# Patient Record
Sex: Female | Born: 1935
Health system: Southern US, Community
[De-identification: ages and names within clinical notes are randomized; demographics above are authoritative.]

## PROBLEM LIST (undated history)

## (undated) DIAGNOSIS — I10 Essential (primary) hypertension: Secondary | ICD-10-CM

## (undated) DIAGNOSIS — C801 Malignant (primary) neoplasm, unspecified: Secondary | ICD-10-CM

## (undated) DIAGNOSIS — E785 Hyperlipidemia, unspecified: Secondary | ICD-10-CM

## (undated) HISTORY — PX: ABDOMINAL HYSTERECTOMY: SHX81

## (undated) HISTORY — PX: APPENDECTOMY: SHX54

## (undated) HISTORY — PX: GALLBLADDER SURGERY: SHX652

## (undated) HISTORY — PX: OTHER SURGICAL HISTORY: SHX169

## (undated) HISTORY — DX: Hyperlipidemia, unspecified: E78.5

## (undated) HISTORY — PX: TOE SURGERY: SHX1073

## (undated) HISTORY — PX: BASAL CELL CARCINOMA EXCISION: SHX1214

## (undated) HISTORY — DX: Essential (primary) hypertension: I10

---

## 1978-08-09 HISTORY — PX: BREAST EXCISIONAL BIOPSY: SUR124

## 2004-04-12 ENCOUNTER — Ambulatory Visit: Payer: Self-pay | Admitting: Family Medicine

## 2005-06-06 ENCOUNTER — Ambulatory Visit: Payer: Self-pay | Admitting: Family Medicine

## 2006-06-12 ENCOUNTER — Ambulatory Visit: Payer: Self-pay | Admitting: Family Medicine

## 2007-06-17 ENCOUNTER — Ambulatory Visit: Payer: Self-pay | Admitting: Family Medicine

## 2008-06-17 ENCOUNTER — Ambulatory Visit: Payer: Self-pay | Admitting: Family Medicine

## 2008-06-29 ENCOUNTER — Ambulatory Visit: Payer: Self-pay | Admitting: Family Medicine

## 2009-06-22 ENCOUNTER — Ambulatory Visit: Payer: Self-pay | Admitting: Family Medicine

## 2010-06-27 ENCOUNTER — Ambulatory Visit: Payer: Self-pay | Admitting: Family Medicine

## 2010-06-30 ENCOUNTER — Ambulatory Visit: Payer: Self-pay | Admitting: Family Medicine

## 2011-07-25 ENCOUNTER — Ambulatory Visit: Payer: Self-pay | Admitting: Family Medicine

## 2012-03-13 DIAGNOSIS — E7800 Pure hypercholesterolemia, unspecified: Secondary | ICD-10-CM | POA: Insufficient documentation

## 2012-03-13 DIAGNOSIS — I10 Essential (primary) hypertension: Secondary | ICD-10-CM | POA: Insufficient documentation

## 2012-03-15 DIAGNOSIS — C4491 Basal cell carcinoma of skin, unspecified: Secondary | ICD-10-CM | POA: Insufficient documentation

## 2012-08-19 ENCOUNTER — Ambulatory Visit: Payer: Self-pay | Admitting: Family Medicine

## 2013-08-26 ENCOUNTER — Ambulatory Visit: Payer: Self-pay | Admitting: Family Medicine

## 2013-11-10 DIAGNOSIS — N289 Disorder of kidney and ureter, unspecified: Secondary | ICD-10-CM | POA: Insufficient documentation

## 2014-02-27 ENCOUNTER — Encounter: Payer: Self-pay | Admitting: Podiatry

## 2014-02-27 ENCOUNTER — Ambulatory Visit (INDEPENDENT_AMBULATORY_CARE_PROVIDER_SITE_OTHER): Payer: Medicare Other

## 2014-02-27 ENCOUNTER — Ambulatory Visit (INDEPENDENT_AMBULATORY_CARE_PROVIDER_SITE_OTHER): Payer: Medicare Other | Admitting: Podiatry

## 2014-02-27 VITALS — BP 121/61 | HR 63 | Resp 16 | Ht 67.0 in | Wt 155.0 lb

## 2014-02-27 DIAGNOSIS — M779 Enthesopathy, unspecified: Secondary | ICD-10-CM

## 2014-02-27 NOTE — Progress Notes (Signed)
   Subjective:    Patient ID: Faith Cox, female    DOB: [birth year], 80 y.o.   MRN: [id number]  HPI Comments: Temple Pacini of feet have been burning for about a month now. Also i dropped a shelf on the right great toe. Would like that looked at   Foot Pain      Review of Systems  All other systems reviewed and are negative.      Objective:   Physical Exam        Assessment & Plan:

## 2014-02-28 NOTE — Progress Notes (Signed)
Subjective:     Patient ID: Faith Cox, female   DOB: 26-Sep-1929, 80 y.o.   MRN: [id number]  HPI patient states she's had moderate burning in both her feet for the last month and that she traumatized her big toe nail right by dropping a shelf on it and it has turned black in the nail   Review of Systems  All other systems reviewed and are negative.      Objective:   Physical Exam  Constitutional: She is oriented to person, place, and time.  Cardiovascular: Intact distal pulses.   Musculoskeletal: Normal range of motion.  Neurological: She is oriented to person, place, and time.  Skin: Skin is warm.  Nursing note and vitals reviewed.  neurovascular status intact with muscle strength adequate and range of motion subtalar midtarsal joint diminished but intact. Patient's noted to have mild discomfort in the forefoot of both feet but no one specific area and is noted to have a discolored damage right hallux nail secondary to trauma     Assessment:     Possible low grade neuropathy or inflammatory capsulitis and structural of the right hallux nail has been compromised with damage    Plan:     H&P and conditions discussed and at this point I recommended soaks of the right hallux and she may lose the nail and we will start her on Advil 23 times a day and if symptoms persist she will reappoint

## 2014-05-20 DIAGNOSIS — E539 Vitamin B deficiency, unspecified: Secondary | ICD-10-CM | POA: Insufficient documentation

## 2014-09-01 ENCOUNTER — Other Ambulatory Visit: Payer: Self-pay | Admitting: Family Medicine

## 2014-09-01 DIAGNOSIS — Z1231 Encounter for screening mammogram for malignant neoplasm of breast: Secondary | ICD-10-CM

## 2014-09-16 ENCOUNTER — Ambulatory Visit
Admission: RE | Admit: 2014-09-16 | Discharge: 2014-09-16 | Disposition: A | Discharge status: Home / Self Care | Payer: Medicare Other | Source: Ambulatory Visit | Attending: Family Medicine | Admitting: Family Medicine

## 2014-09-16 ENCOUNTER — Other Ambulatory Visit: Payer: Self-pay | Admitting: Family Medicine

## 2014-09-16 DIAGNOSIS — Z1231 Encounter for screening mammogram for malignant neoplasm of breast: Secondary | ICD-10-CM

## 2015-05-10 ENCOUNTER — Encounter: Payer: Self-pay | Admitting: Family Medicine

## 2015-05-10 ENCOUNTER — Ambulatory Visit (INDEPENDENT_AMBULATORY_CARE_PROVIDER_SITE_OTHER): Payer: Medicare Other | Admitting: Family Medicine

## 2015-05-10 VITALS — BP 138/58 | HR 72 | Temp 98.1°F | Ht 63.6 in | Wt 162.0 lb

## 2015-05-10 DIAGNOSIS — E785 Hyperlipidemia, unspecified: Secondary | ICD-10-CM | POA: Diagnosis not present

## 2015-05-10 DIAGNOSIS — I129 Hypertensive chronic kidney disease with stage 1 through stage 4 chronic kidney disease, or unspecified chronic kidney disease: Secondary | ICD-10-CM

## 2015-05-10 MED ORDER — LOVASTATIN 20 MG PO TABS: 40.0000 mg | ORAL_TABLET | Freq: Every day | ORAL | Status: DC

## 2015-05-10 NOTE — Progress Notes (Signed)
BP 138/58 mmHg  Pulse 72  Temp(Src) 98.1 F (36.7 C)  Ht 5' 3.6" (1.615 m)  Wt 162 lb (73.483 kg)  BMI 28.17 kg/m2  SpO2 96%   Subjective:    Patient ID: Faith Cox, female    DOB: [birth year], 82 y.o.   MRN: [id number]  HPI: Faith Cox is a 82 y.o. female  Chief Complaint  Patient presents with  . Establish Care    Patient is here to establish care, she does not have any concerns today.   HYPERTENSION / HYPERLIPIDEMIA Satisfied with current treatment? yes, has had problems for 10 years Duration of hypertension: About 10 years BP monitoring frequency: weekly BP range: 120s-130s BP medication side effects: no Past BP meds: none Duration of hyperlipidemia: 10 years or so Cholesterol medication side effects: no Cholesterol supplements: fish oil Past cholesterol medications: none Medication compliance: excellent compliance Aspirin: yes Recent stressors: no Recurrent headaches: no Visual changes: no Palpitations: no Dyspnea: no Chest pain: no Lower extremity edema: no Dizzy/lightheaded: no  Active Ambulatory Problems    Diagnosis Date Noted  . CA skin, basal cell 03/15/2012  . Impaired renal function 11/10/2013  . Hyperlipidemia   . Benign hypertensive renal disease    Resolved Ambulatory Problems    Diagnosis Date Noted  . BP (high blood pressure) 03/13/2012  . Hypercholesteremia 03/13/2012   Past Medical History  Diagnosis Date  . Hypertension     No Known Allergies Past Surgical History  Procedure Laterality Date  . Abdominal hysterectomy    . Gallbladder surgery    . Bladder repair    . Toe surgery    . Breast excisional biopsy Left 08/09/1978   Family History  Problem Relation Age of Onset  . Pancreatic cancer Mother   . Stroke Father   . Alzheimer's disease Paternal Grandmother   . Leukemia Brother    Social History   Social History  . Marital Status: Married    Spouse Name: N/A  . Number of Children: N/A  . Years of Education: N/A    Social History Main Topics  . Smoking status: Former Smoker    Quit date: 04/11/1979  . Smokeless tobacco: Never Used     Comment: quit 30 years ago   . Alcohol Use: 0.0 oz/week    0 Standard drinks or equivalent per week     Comment: beer on occasion  . Drug Use: No  . Sexual Activity: Not Currently   Other Topics Concern  . None   Social History Narrative    Review of Systems  Constitutional: Negative.   Respiratory: Negative.   Cardiovascular: Negative.   Musculoskeletal: Negative.   Psychiatric/Behavioral: Negative.     Per HPI unless specifically indicated above     Objective:    BP 138/58 mmHg  Pulse 72  Temp(Src) 98.1 F (36.7 C)  Ht 5' 3.6" (1.615 m)  Wt 162 lb (73.483 kg)  BMI 28.17 kg/m2  SpO2 96%  Wt Readings from Last 3 Encounters:  05/10/15 162 lb (73.483 kg)  02/27/14 155 lb (70.308 kg)    Physical Exam  Constitutional: She is oriented to person, place, and time. She appears well-developed and well-nourished. No distress.  HENT:  Head: Normocephalic and atraumatic.  Right Ear: Hearing normal.  Left Ear: Hearing normal.  Nose: Nose normal.  Eyes: Conjunctivae and lids are normal. Right eye exhibits no discharge. Left eye exhibits no discharge. No scleral icterus.  Cardiovascular: Normal rate, regular rhythm, normal heart  sounds and intact distal pulses.  Exam reveals no gallop.   No murmur heard. Pulmonary/Chest: Effort normal and breath sounds normal. No respiratory distress. She has no wheezes. She has no rales. She exhibits no tenderness.  Musculoskeletal: Normal range of motion.  Neurological: She is alert and oriented to person, place, and time.  Skin: Skin is warm, dry and intact. No rash noted. No erythema. No pallor.  Psychiatric: She has a normal mood and affect. Her speech is normal and behavior is normal. Judgment and thought content normal. Cognition and memory are normal.  Nursing note and vitals reviewed.   No results found  for this or any previous visit.    Assessment & Plan:   Problem List Items Addressed This Visit      Genitourinary   Benign hypertensive renal disease - Primary    Under good control at this time. Continue current regimen. Continue to monitor. Recheck at [facility] exam in 1+ months        Other   Hyperlipidemia    Has been under good control under current regimen. Continue current regimen. Continue to monitor.       Relevant Medications   aspirin 81 MG tablet   lovastatin (MEVACOR) 20 MG tablet       Follow up plan: Return Feb, for Wellness, records release please.

## 2015-05-10 NOTE — Assessment & Plan Note (Signed)
Under good control at this time. Continue current regimen. Continue to monitor. Recheck at [facility] exam in 1+ months

## 2015-05-10 NOTE — Assessment & Plan Note (Signed)
Has been under good control under current regimen. Continue current regimen. Continue to monitor.

## 2015-05-24 DIAGNOSIS — D485 Neoplasm of uncertain behavior of skin: Secondary | ICD-10-CM | POA: Diagnosis not present

## 2015-05-24 DIAGNOSIS — Z85828 Personal history of other malignant neoplasm of skin: Secondary | ICD-10-CM | POA: Diagnosis not present

## 2015-05-24 DIAGNOSIS — C44519 Basal cell carcinoma of skin of other part of trunk: Secondary | ICD-10-CM | POA: Diagnosis not present

## 2015-05-24 DIAGNOSIS — Z08 Encounter for follow-up examination after completed treatment for malignant neoplasm: Secondary | ICD-10-CM | POA: Diagnosis not present

## 2015-05-24 DIAGNOSIS — X32XXXA Exposure to sunlight, initial encounter: Secondary | ICD-10-CM | POA: Diagnosis not present

## 2015-05-24 DIAGNOSIS — D0439 Carcinoma in situ of skin of other parts of face: Secondary | ICD-10-CM | POA: Diagnosis not present

## 2015-05-24 DIAGNOSIS — L570 Actinic keratosis: Secondary | ICD-10-CM | POA: Diagnosis not present

## 2015-05-24 DIAGNOSIS — C44719 Basal cell carcinoma of skin of left lower limb, including hip: Secondary | ICD-10-CM | POA: Diagnosis not present

## 2015-05-24 DIAGNOSIS — C44329 Squamous cell carcinoma of skin of other parts of face: Secondary | ICD-10-CM | POA: Diagnosis not present

## 2015-06-01 ENCOUNTER — Ambulatory Visit (INDEPENDENT_AMBULATORY_CARE_PROVIDER_SITE_OTHER): Payer: Medicare Other | Admitting: Family Medicine

## 2015-06-01 ENCOUNTER — Encounter: Payer: Self-pay | Admitting: Family Medicine

## 2015-06-01 VITALS — BP 149/74 | HR 66 | Temp 97.8°F | Ht 64.0 in | Wt 160.0 lb

## 2015-06-01 DIAGNOSIS — E785 Hyperlipidemia, unspecified: Secondary | ICD-10-CM | POA: Diagnosis not present

## 2015-06-01 DIAGNOSIS — I129 Hypertensive chronic kidney disease with stage 1 through stage 4 chronic kidney disease, or unspecified chronic kidney disease: Secondary | ICD-10-CM | POA: Diagnosis not present

## 2015-06-01 DIAGNOSIS — N289 Disorder of kidney and ureter, unspecified: Secondary | ICD-10-CM | POA: Diagnosis not present

## 2015-06-01 DIAGNOSIS — Z0000 Encounter for general adult medical examination without abnormal findings: Secondary | ICD-10-CM

## 2015-06-01 DIAGNOSIS — C4491 Basal cell carcinoma of skin, unspecified: Secondary | ICD-10-CM | POA: Diagnosis not present

## 2015-06-01 LAB — MICROALBUMIN, URINE WAIVED
Creatinine, Urine Waived: 100 mg/dL (ref 10–300)
Microalb, Ur Waived: 30 mg/L — ABNORMAL HIGH (ref 0–19)
Microalb/Creat Ratio: <30 mg/g (ref <30)

## 2015-06-01 LAB — LIPID PANEL PICCOLO, WAIVED
Chol/HDL Ratio Piccolo,Waive: 3.5 mg/dL
Cholesterol Piccolo, Waived: 168 mg/dL (ref <200)
HDL CHOL PICCOLO, WAIVED: 48 mg/dL — AB (ref >59)
LDL CHOL CALC PICCOLO WAIVED: 78 mg/dL (ref <100)
TRIGLYCERIDES PICCOLO,WAIVED: 208 mg/dL — AB (ref <150)
VLDL Chol Calc Piccolo,Waive: 42 mg/dL — ABNORMAL HIGH (ref <30)

## 2015-06-01 MED ORDER — LISINOPRIL-HYDROCHLOROTHIAZIDE 20-25 MG PO TABS: 1.0000 | ORAL_TABLET | Freq: Every day | ORAL | Status: DC

## 2015-06-01 NOTE — Assessment & Plan Note (Signed)
Checking CMP today. Await results. Continue ACE. Continue to monitor.

## 2015-06-01 NOTE — Progress Notes (Signed)
BP 149/74 mmHg  Pulse 66  Temp(Src) 97.8 F (36.6 C)  Ht 5\' 4"  (1.626 m)  Wt 160 lb (72.576 kg)  BMI 27.45 kg/m2  SpO2 98%   Subjective:    Patient ID: Faith Cox, female    DOB: 08/07/1929, 82 y.o.   MRN: [id number]  HPI: Faith Cox is a 82 y.o. female presenting on 06/01/2015 for comprehensive medical examination. Current medical complaints include: none  She currently lives with: husband Menopausal Symptoms: no  Depression Screen done today and results listed below:  Depression screen [facility] 2/9 06/01/2015  Decreased Interest 0  Down, Depressed, Hopeless 0  PHQ - 2 Score 0    The patient does not have a history of falls. I did not complete a risk assessment for falls. A plan of care for falls was not documented.   Past Medical History:  Past Medical History  Diagnosis Date  . Hyperlipidemia   . Hypertension     Surgical History:  Past Surgical History  Procedure Laterality Date  . Abdominal hysterectomy    . Gallbladder surgery    . Bladder repair    . Toe surgery    . Breast excisional biopsy Left 08/09/1978    Medications:  Current Outpatient Prescriptions on File Prior to Visit  Medication Sig  . aspirin 81 MG tablet Take 81 mg by mouth daily.  . Calcium Carb-Cholecalciferol [phone] MG-UNIT CAPS Take by mouth.  . lovastatin (MEVACOR) 20 MG tablet Take 2 tablets (40 mg total) by mouth at bedtime.  . Omega-3 Fatty Acids (FISH OIL) 1000 MG CPDR Take by mouth.  . vitamin B-12 (CYANOCOBALAMIN) 1000 MCG tablet Take 1,000 mcg by mouth daily.   No current facility-administered medications on file prior to visit.    Allergies:  No Known Allergies  Social History:  Social History   Social History  . Marital Status: Married    Spouse Name: N/A  . Number of Children: N/A  . Years of Education: N/A   Occupational History  . Not on file.   Social History Main Topics  . Smoking status: Former Smoker    Quit date: 04/11/1979  . Smokeless tobacco: Never  Used     Comment: quit 30 years ago   . Alcohol Use: 0.0 oz/week    0 Standard drinks or equivalent per week     Comment: beer on occasion  . Drug Use: No  . Sexual Activity: Not Currently   Other Topics Concern  . Not on file   Social History Narrative   History  Smoking status  . Former Smoker  . Quit date: 04/11/1979  Smokeless tobacco  . Never Used    Comment: quit 30 years ago    History  Alcohol Use  . 0.0 oz/week  . 0 Standard drinks or equivalent per week    Comment: beer on occasion    Family History:  Family History  Problem Relation Age of Onset  . Pancreatic cancer Mother   . Stroke Father   . Alzheimer's disease Paternal Grandmother   . Leukemia Brother     Past medical history, surgical history, medications, allergies, family history and social history reviewed with patient today and changes made to appropriate areas of the chart.   Review of Systems  Constitutional: Negative.   HENT: Negative.   Eyes: Negative.   Respiratory: Negative.   Cardiovascular: Negative.   Gastrointestinal: Negative.   Genitourinary: Negative.   Musculoskeletal: Negative.   Skin: Negative.  Neurological: Negative.   Endo/Heme/Allergies: Negative for environmental allergies and polydipsia. Bruises/bleeds easily.  Psychiatric/Behavioral: Negative.     All other ROS negative except what is listed above and in the HPI.      Objective:    BP 149/74 mmHg  Pulse 66  Temp(Src) 97.8 F (36.6 C)  Ht 5\' 4"  (1.626 m)  Wt 160 lb (72.576 kg)  BMI 27.45 kg/m2  SpO2 98%  Wt Readings from Last 3 Encounters:  06/01/15 160 lb (72.576 kg)  05/10/15 162 lb (73.483 kg)  02/27/14 155 lb (70.308 kg)     Hearing Screening   125Hz  250Hz  500Hz  1000Hz  2000Hz  4000Hz  8000Hz   Right ear:   Fail Fail 40 Fail   Left ear:   Fail 40 Fail Fail     Visual Acuity Screening   Right eye Left eye Both eyes  Without correction: 20/40 20/40 20/40   With correction:       Physical Exam   Constitutional: She is oriented to person, place, and time. She appears well-developed and well-nourished. No distress.  HENT:  Head: Normocephalic and atraumatic.  Right Ear: Hearing and external ear normal.  Left Ear: Hearing and external ear normal.  Nose: Nose normal.  Mouth/Throat: Oropharynx is clear and moist. No oropharyngeal exudate.  Eyes: Conjunctivae, EOM and lids are normal. Pupils are equal, round, and reactive to light. Right eye exhibits no discharge. Left eye exhibits no discharge. No scleral icterus.  Neck: Normal range of motion. Neck supple. No JVD present. No tracheal deviation present. No thyromegaly present.  Cardiovascular: Normal rate, regular rhythm, normal heart sounds and intact distal pulses.  Exam reveals no gallop and no friction rub.   No murmur heard. Pulmonary/Chest: Effort normal and breath sounds normal. No stridor. No respiratory distress. She has no wheezes. She has no rales. She exhibits no tenderness. Right breast exhibits no inverted nipple, no mass, no nipple discharge, no skin change and no tenderness. Left breast exhibits no inverted nipple, no mass, no nipple discharge, no skin change and no tenderness. Breasts are symmetrical.  Abdominal: Soft. Bowel sounds are normal. She exhibits no distension and no mass. There is no tenderness. There is no rebound and no guarding.  Genitourinary:  Deferred with shared decision making  Musculoskeletal: Normal range of motion. She exhibits no edema or tenderness.  Lymphadenopathy:    She has no cervical adenopathy.  Neurological: She is alert and oriented to person, place, and time. She has normal reflexes. She displays normal reflexes. No cranial nerve deficit. She exhibits normal muscle tone. Coordination normal.  Skin: Skin is warm, dry and intact. No rash noted. She is not diaphoretic. No erythema. No pallor.  Psychiatric: She has a normal mood and affect. Her speech is normal and behavior is normal. Judgment  and thought content normal. Cognition and memory are normal.  Nursing note and vitals reviewed.   Results for orders placed or performed in visit on 06/01/15  Lipid Panel Piccolo, [employer]  Result Value Ref Range   Cholesterol Piccolo, Waived 168 <200 mg/dL   HDL Chol Piccolo, Waived 48 (L) >59 mg/dL   Triglycerides Piccolo,Waived 208 (H) <150 mg/dL   Chol/HDL Ratio Piccolo,Waive 3.5 mg/dL   LDL Chol Calc Piccolo Waived 78 <100 mg/dL   VLDL Chol Calc Piccolo,Waive 42 (H) <30 mg/dL  Microalbumin, Urine Waived  Result Value Ref Range   Microalb, Ur Waived 30 (H) 0 - 19 mg/L   Creatinine, Urine Waived 100 10 - 300 mg/dL  Microalb/Creat Ratio <30 <30 mg/g      Assessment & Plan:   Problem List Items Addressed This Visit      Musculoskeletal and Integument   CA skin, basal cell    Continue to follow with dermatology. Stable. Call with any concerns.         Genitourinary   Impaired renal function    Checking CMP today. Await results. Continue ACE. Continue to monitor.       Relevant Orders   CBC with Differential/Platelet   Microalbumin, Urine Waived (Completed)   Benign hypertensive renal disease    Under fair control. Continue current regimen. Checking labs today. Await results.       Relevant Orders   Comprehensive metabolic panel   CBC with Differential/Platelet   TSH   Microalbumin, Urine Waived (Completed)     Other   Hyperlipidemia    Under good control on labwork today. Continue current regimen. Continue to monitor.       Relevant Medications   lisinopril-hydrochlorothiazide (PRINZIDE,ZESTORETIC) 20-25 MG tablet   Other Relevant Orders   Lipid Panel Piccolo, Waived (Completed)   Comprehensive metabolic panel   CBC with Differential/Platelet    Other Visit Diagnoses    Medicare annual wellness visit, subsequent    -  Primary    Discussed preventative care as discussed below. Screening labs checked. Vaccines up to date. Recommended advanced directives.          Follow up plan: Return in about 6 months (around 11/29/2015) for HTN/HLD.  Preventative Services:  Health Risk Assessment and Personalized Prevention Plan: done today Bone Mass Measurements: declined Breast Cancer Screening: up to date CVD Screening: done today Cervical Cancer Screening: N/A Colon Cancer Screening: up to date Depression Screening: up to date Diabetes Screening: up to date Glaucoma Screening: suggested to patient Hepatitis B vaccine: N/A Hepatitis C screening: N/A HIV Screening: Declined Flu Vaccine: Up to date Lung cancer Screening: N/A Obesity Screening: Done today Pneumonia Vaccines (2): up to date STI Screening: declined  LABORATORY TESTING:  - Pap smear: not applicable  IMMUNIZATIONS:   - Tdap: Tetanus vaccination status reviewed: declined  - Influenza: Up to date - Pneumovax: Given elsewhere - Prevnar: Up to date - Zostavax vaccine: Refused  SCREENING: -Mammogram: Up to date  - Colonoscopy: Not applicable  - Bone Density: Refused  -Hearing Test: Ordered today  -Spirometry: Not applicable   PATIENT COUNSELING:   Advised to take 1 mg of folate supplement per day if capable of pregnancy.   Sexuality: Discussed sexually transmitted diseases, partner selection, use of condoms, avoidance of unintended pregnancy  and contraceptive alternatives.   Advised to avoid cigarette smoking.  I discussed with the patient that most people either abstain from alcohol or drink within safe limits (<=14/week and <=4 drinks/occasion for males, <=7/weeks and <= 3 drinks/occasion for females) and that the risk for alcohol disorders and other health effects rises proportionally with the number of drinks per week and how often a drinker exceeds daily limits.  Discussed cessation/primary prevention of drug use and availability of treatment for abuse.   Diet: Encouraged to adjust caloric intake to maintain  or achieve ideal body weight, to reduce intake of dietary  saturated fat and total fat, to limit sodium intake by avoiding high sodium foods and not adding table salt, and to maintain adequate dietary potassium and calcium preferably from fresh fruits, vegetables, and low-fat dairy products.    stressed the importance of regular exercise  Injury prevention: Discussed  safety belts, safety helmets, smoke detector, smoking near bedding or upholstery.   Dental health: Discussed importance of regular tooth brushing, flossing, and dental visits.    NEXT PREVENTATIVE PHYSICAL DUE IN 1 YEAR. Return in about 6 months (around 11/29/2015) for HTN/HLD.

## 2015-06-01 NOTE — Assessment & Plan Note (Signed)
Under good control on labwork today. Continue current regimen. Continue to monitor.

## 2015-06-01 NOTE — Assessment & Plan Note (Signed)
Under fair control. Continue current regimen. Checking labs today. Await results.

## 2015-06-01 NOTE — Assessment & Plan Note (Signed)
Continue to follow with dermatology. Stable. Call with any concerns.

## 2015-06-02 ENCOUNTER — Encounter: Payer: Self-pay | Admitting: Family Medicine

## 2015-06-02 LAB — COMPREHENSIVE METABOLIC PANEL
ALBUMIN: 4.5 g/dL (ref 3.5–4.7)
ALT: 10 IU/L (ref 0–32)
AST: 19 IU/L (ref 0–40)
Albumin/Globulin Ratio: 2 (ref 1.1–2.5)
Alkaline Phosphatase: 74 IU/L (ref 39–117)
BUN/Creatinine Ratio: 23 (ref 11–26)
BUN: 28 mg/dL — ABNORMAL HIGH (ref 8–27)
Bilirubin Total: 0.6 mg/dL (ref 0.0–1.2)
CALCIUM: 9.8 mg/dL (ref 8.7–10.3)
CHLORIDE: 102 mmol/L (ref 96–106)
CO2: 21 mmol/L (ref 18–29)
Creatinine, Ser: 1.23 mg/dL — ABNORMAL HIGH (ref 0.57–1.00)
GFR calc Af Amer: 47 mL/min/{1.73_m2} — ABNORMAL LOW (ref >59)
GFR calc non Af Amer: 41 mL/min/{1.73_m2} — ABNORMAL LOW (ref >59)
GLOBULIN, TOTAL: 2.2 g/dL (ref 1.5–4.5)
GLUCOSE: 94 mg/dL (ref 65–99)
POTASSIUM: 4.5 mmol/L (ref 3.5–5.2)
Sodium: 142 mmol/L (ref 134–144)
TOTAL PROTEIN: 6.7 g/dL (ref 6.0–8.5)

## 2015-06-02 LAB — CBC WITH DIFFERENTIAL/PLATELET
BASOS ABS: 0 10*3/uL (ref 0.0–0.2)
Basos: 0 %
EOS (ABSOLUTE): 0.5 10*3/uL — ABNORMAL HIGH (ref 0.0–0.4)
Eos: 7 %
HEMATOCRIT: 33.7 % — AB (ref 34.0–46.6)
Hemoglobin: 11.4 g/dL (ref 11.1–15.9)
Immature Grans (Abs): 0 10*3/uL (ref 0.0–0.1)
Immature Granulocytes: 0 %
LYMPHS ABS: 2.2 10*3/uL (ref 0.7–3.1)
Lymphs: 29 %
MCH: 30.5 pg (ref 26.6–33.0)
MCHC: 33.8 g/dL (ref 31.5–35.7)
MCV: 90 fL (ref 79–97)
MONOS ABS: 0.5 10*3/uL (ref 0.1–0.9)
Monocytes: 6 %
Neutrophils Absolute: 4.3 10*3/uL (ref 1.4–7.0)
Neutrophils: 58 %
Platelets: 219 10*3/uL (ref 150–379)
RBC: 3.74 x10E6/uL — AB (ref 3.77–5.28)
RDW: 13.6 % (ref 12.3–15.4)
WBC: 7.6 10*3/uL (ref 3.4–10.8)

## 2015-06-02 LAB — TSH: TSH: 1.14 u[IU]/mL (ref 0.450–4.500)

## 2015-07-14 DIAGNOSIS — L905 Scar conditions and fibrosis of skin: Secondary | ICD-10-CM | POA: Diagnosis not present

## 2015-07-14 DIAGNOSIS — C44329 Squamous cell carcinoma of skin of other parts of face: Secondary | ICD-10-CM | POA: Diagnosis not present

## 2015-07-14 DIAGNOSIS — C44319 Basal cell carcinoma of skin of other parts of face: Secondary | ICD-10-CM | POA: Diagnosis not present

## 2015-07-21 DIAGNOSIS — L905 Scar conditions and fibrosis of skin: Secondary | ICD-10-CM | POA: Diagnosis not present

## 2015-07-21 DIAGNOSIS — D0439 Carcinoma in situ of skin of other parts of face: Secondary | ICD-10-CM | POA: Diagnosis not present

## 2015-07-28 DIAGNOSIS — C44519 Basal cell carcinoma of skin of other part of trunk: Secondary | ICD-10-CM | POA: Diagnosis not present

## 2015-07-28 DIAGNOSIS — C44719 Basal cell carcinoma of skin of left lower limb, including hip: Secondary | ICD-10-CM | POA: Diagnosis not present

## 2015-08-05 ENCOUNTER — Encounter: Payer: Self-pay | Admitting: Family Medicine

## 2015-08-05 ENCOUNTER — Other Ambulatory Visit: Payer: Self-pay | Admitting: Family Medicine

## 2015-08-05 DIAGNOSIS — Z1231 Encounter for screening mammogram for malignant neoplasm of breast: Secondary | ICD-10-CM

## 2015-09-17 ENCOUNTER — Encounter: Payer: Self-pay | Admitting: Family Medicine

## 2015-09-17 ENCOUNTER — Other Ambulatory Visit: Payer: Self-pay | Admitting: Family Medicine

## 2015-09-17 ENCOUNTER — Ambulatory Visit
Admission: RE | Admit: 2015-09-17 | Discharge: 2015-09-17 | Disposition: A | Discharge status: Home / Self Care | Payer: Medicare Other | Source: Ambulatory Visit | Attending: Family Medicine | Admitting: Family Medicine

## 2015-09-17 DIAGNOSIS — Z1231 Encounter for screening mammogram for malignant neoplasm of breast: Secondary | ICD-10-CM | POA: Insufficient documentation

## 2015-11-03 DIAGNOSIS — D485 Neoplasm of uncertain behavior of skin: Secondary | ICD-10-CM | POA: Diagnosis not present

## 2015-11-03 DIAGNOSIS — Z85828 Personal history of other malignant neoplasm of skin: Secondary | ICD-10-CM | POA: Diagnosis not present

## 2015-11-03 DIAGNOSIS — Z08 Encounter for follow-up examination after completed treatment for malignant neoplasm: Secondary | ICD-10-CM | POA: Diagnosis not present

## 2015-11-03 DIAGNOSIS — L570 Actinic keratosis: Secondary | ICD-10-CM | POA: Diagnosis not present

## 2015-11-03 DIAGNOSIS — X32XXXA Exposure to sunlight, initial encounter: Secondary | ICD-10-CM | POA: Diagnosis not present

## 2015-11-03 DIAGNOSIS — C4441 Basal cell carcinoma of skin of scalp and neck: Secondary | ICD-10-CM | POA: Diagnosis not present

## 2015-11-29 ENCOUNTER — Ambulatory Visit: Payer: Medicare Other | Admitting: Family Medicine

## 2015-12-07 ENCOUNTER — Other Ambulatory Visit: Payer: Self-pay | Admitting: Family Medicine

## 2015-12-07 DIAGNOSIS — I129 Hypertensive chronic kidney disease with stage 1 through stage 4 chronic kidney disease, or unspecified chronic kidney disease: Secondary | ICD-10-CM

## 2015-12-07 DIAGNOSIS — N289 Disorder of kidney and ureter, unspecified: Secondary | ICD-10-CM

## 2015-12-07 DIAGNOSIS — E785 Hyperlipidemia, unspecified: Secondary | ICD-10-CM

## 2015-12-08 ENCOUNTER — Ambulatory Visit (INDEPENDENT_AMBULATORY_CARE_PROVIDER_SITE_OTHER): Payer: Medicare Other | Admitting: Family Medicine

## 2015-12-08 ENCOUNTER — Encounter: Payer: Self-pay | Admitting: Family Medicine

## 2015-12-08 VITALS — BP 125/75 | HR 68 | Temp 98.0°F | Ht 64.9 in | Wt 162.2 lb

## 2015-12-08 DIAGNOSIS — N289 Disorder of kidney and ureter, unspecified: Secondary | ICD-10-CM

## 2015-12-08 DIAGNOSIS — I129 Hypertensive chronic kidney disease with stage 1 through stage 4 chronic kidney disease, or unspecified chronic kidney disease: Secondary | ICD-10-CM

## 2015-12-08 DIAGNOSIS — G4700 Insomnia, unspecified: Secondary | ICD-10-CM | POA: Diagnosis not present

## 2015-12-08 DIAGNOSIS — E785 Hyperlipidemia, unspecified: Secondary | ICD-10-CM | POA: Diagnosis not present

## 2015-12-08 DIAGNOSIS — Z23 Encounter for immunization: Secondary | ICD-10-CM

## 2015-12-08 LAB — LIPID PANEL PICCOLO, WAIVED
CHOLESTEROL PICCOLO, WAIVED: 166 mg/dL (ref <200)
Chol/HDL Ratio Piccolo,Waive: 3.1 mg/dL
HDL Chol Piccolo, Waived: 54 mg/dL — ABNORMAL LOW (ref >59)
LDL Chol Calc Piccolo Waived: 83 mg/dL (ref <100)
Triglycerides Piccolo,Waived: 149 mg/dL (ref <150)
VLDL Chol Calc Piccolo,Waive: 30 mg/dL — ABNORMAL HIGH (ref <30)

## 2015-12-08 LAB — MICROALBUMIN, URINE WAIVED
Creatinine, Urine Waived: 100 mg/dL (ref 10–300)
MICROALB, UR WAIVED: 30 mg/L — AB (ref 0–19)

## 2015-12-08 MED ORDER — LOVASTATIN 20 MG PO TABS: 40.0000 mg | ORAL_TABLET | Freq: Every day | ORAL | 1 refills | Status: DC

## 2015-12-08 MED ORDER — TRAZODONE HCL 50 MG PO TABS: 25.0000 mg | ORAL_TABLET | Freq: Every evening | ORAL | 3 refills | Status: DC | PRN

## 2015-12-08 MED ORDER — LISINOPRIL-HYDROCHLOROTHIAZIDE 20-25 MG PO TABS
1.0000 | ORAL_TABLET | Freq: Every day | ORAL | 1 refills | Status: DC
Start: 2015-12-08 — End: 2016-06-06

## 2015-12-08 NOTE — Progress Notes (Signed)
BP 125/75 (BP Location: Left Arm, Patient Position: Sitting, Cuff Size: Large)   Pulse 68   Temp 98 F (36.7 C)   Ht 5' 4.9" (1.648 m)   Wt 162 lb 3.2 oz (73.6 kg)   SpO2 98%   BMI 27.07 kg/m    Subjective:    Patient ID: Faith Cox, female    DOB: 12-09-1929, 82 y.o.   MRN: [id number]  HPI: CNYTHIA Cox is a 82 y.o. female  Chief Complaint  Patient presents with  . Hyperlipidemia  . Hypertension   Lost her husband in June. Has been doing OK. Very close with her daughter who lives close. She does note that she has been having trouble sleeping. Only sleeping a couple of hours and then waking up.   HYPERTENSION / HYPERLIPIDEMIA Satisfied with current treatment? yes Duration of hypertension: chronic BP monitoring frequency: not checking BP medication side effects: no Duration of hyperlipidemia: chronic Cholesterol medication side effects: no Cholesterol supplements: fish oil Medication compliance: excellent compliance Aspirin: yes Recent stressors: yes Recurrent headaches: no Visual changes: no Palpitations: no Dyspnea: no Chest pain: no Lower extremity edema: no Dizzy/lightheaded: no  Relevant past medical, surgical, family and social history reviewed and updated as indicated. Interim medical history since our last visit reviewed. Allergies and medications reviewed and updated.  Review of Systems  Constitutional: Negative.   Respiratory: Negative.   Cardiovascular: Negative.   Psychiatric/Behavioral: Positive for dysphoric mood and sleep disturbance. Negative for agitation, behavioral problems, confusion, decreased concentration, hallucinations, self-injury and suicidal ideas. The patient is not nervous/anxious and is not hyperactive.     Per HPI unless specifically indicated above     Objective:    BP 125/75 (BP Location: Left Arm, Patient Position: Sitting, Cuff Size: Large)   Pulse 68   Temp 98 F (36.7 C)   Ht 5' 4.9" (1.648 m)   Wt 162 lb 3.2 oz  (73.6 kg)   SpO2 98%   BMI 27.07 kg/m   Wt Readings from Last 3 Encounters:  12/08/15 162 lb 3.2 oz (73.6 kg)  06/01/15 160 lb (72.6 kg)  05/10/15 162 lb (73.5 kg)    Physical Exam  Constitutional: She is oriented to person, place, and time. She appears well-developed and well-nourished. No distress.  HENT:  Head: Normocephalic and atraumatic.  Right Ear: Hearing normal.  Left Ear: Hearing normal.  Nose: Nose normal.  Eyes: Conjunctivae and lids are normal. Right eye exhibits no discharge. Left eye exhibits no discharge. No scleral icterus.  Cardiovascular: Normal rate, regular rhythm, normal heart sounds and intact distal pulses.  Exam reveals no gallop and no friction rub.   No murmur heard. Pulmonary/Chest: Effort normal and breath sounds normal. No respiratory distress. She has no wheezes. She has no rales. She exhibits no tenderness.  Musculoskeletal: Normal range of motion.  Neurological: She is alert and oriented to person, place, and time.  Skin: Skin is warm, dry and intact. No rash noted. No erythema. No pallor.  Psychiatric: She has a normal mood and affect. Her speech is normal and behavior is normal. Judgment and thought content normal. Cognition and memory are normal.  Nursing note and vitals reviewed.   Results for orders placed or performed in visit on 06/01/15  Lipid Panel Piccolo, [employer]  Result Value Ref Range   Cholesterol Piccolo, Waived 168 <200 mg/dL   HDL Chol Piccolo, Waived 48 (L) >59 mg/dL   Triglycerides Piccolo,Waived 208 (H) <150 mg/dL   Chol/HDL Ratio  Piccolo,Waive 3.5 mg/dL   LDL Chol Calc Piccolo Waived 78 <100 mg/dL   VLDL Chol Calc Piccolo,Waive 42 (H) <30 mg/dL  Comprehensive metabolic panel  Result Value Ref Range   Glucose 94 65 - 99 mg/dL   BUN 28 (H) 8 - 27 mg/dL   Creatinine, Ser 1.23 (H) 0.57 - 1.00 mg/dL   GFR calc non Af Amer 41 (L) >59 mL/min/1.73   GFR calc Af Amer 47 (L) >59 mL/min/1.73   BUN/Creatinine Ratio 23 11 - 26    Sodium 142 134 - 144 mmol/L   Potassium 4.5 3.5 - 5.2 mmol/L   Chloride 102 96 - 106 mmol/L   CO2 21 18 - 29 mmol/L   Calcium 9.8 8.7 - 10.3 mg/dL   Total Protein 6.7 6.0 - 8.5 g/dL   Albumin 4.5 3.5 - 4.7 g/dL   Globulin, Total 2.2 1.5 - 4.5 g/dL   Albumin/Globulin Ratio 2.0 1.1 - 2.5   Bilirubin Total 0.6 0.0 - 1.2 mg/dL   Alkaline Phosphatase 74 39 - 117 IU/L   AST 19 0 - 40 IU/L   ALT 10 0 - 32 IU/L  CBC with Differential/Platelet  Result Value Ref Range   WBC 7.6 3.4 - 10.8 x10E3/uL   RBC 3.74 (L) 3.77 - 5.28 x10E6/uL   Hemoglobin 11.4 11.1 - 15.9 g/dL   Hematocrit 33.7 (L) 34.0 - 46.6 %   MCV 90 79 - 97 fL   MCH 30.5 26.6 - 33.0 pg   MCHC 33.8 31.5 - 35.7 g/dL   RDW 13.6 12.3 - 15.4 %   Platelets 219 150 - 379 x10E3/uL   Neutrophils 58 %   Lymphs 29 %   Monocytes 6 %   Eos 7 %   Basos 0 %   Neutrophils Absolute 4.3 1.4 - 7.0 x10E3/uL   Lymphocytes Absolute 2.2 0.7 - 3.1 x10E3/uL   Monocytes Absolute 0.5 0.1 - 0.9 x10E3/uL   EOS (ABSOLUTE) 0.5 (H) 0.0 - 0.4 x10E3/uL   Basophils Absolute 0.0 0.0 - 0.2 x10E3/uL   Immature Granulocytes 0 %   Immature Grans (Abs) 0.0 0.0 - 0.1 x10E3/uL  TSH  Result Value Ref Range   TSH 1.140 0.450 - 4.500 uIU/mL  Microalbumin, Urine Waived  Result Value Ref Range   Microalb, Ur Waived 30 (H) 0 - 19 mg/L   Creatinine, Urine Waived 100 10 - 300 mg/dL   Microalb/Creat Ratio <30 <30 mg/g      Assessment & Plan:   Problem List Items Addressed This Visit      Genitourinary   Impaired renal function    Checking labs today. Await results.      Benign hypertensive renal disease    Under good control. Continue current regimen. Continue to monitor.         Other   Hyperlipidemia    Under good control. Continue current regimen. Continue to monitor.       Relevant Medications   lisinopril-hydrochlorothiazide (PRINZIDE,ZESTORETIC) 20-25 MG tablet   lovastatin (MEVACOR) 20 MG tablet    Other Visit Diagnoses    Need for  pneumococcal vaccination    -  Primary   Pneumovax given today.    Relevant Orders   Pneumococcal polysaccharide vaccine 23-valent greater than or equal to 2yo subcutaneous/IM (Completed)   Insomnia       DUe to husband's recent death. Will start her on trazodone. Recheck 3 months. Call with concerns.    Immunization due  High dose flu shot given today.   Relevant Orders   Flu vaccine HIGH DOSE PF (Fluzone High dose) (Completed)       Follow up plan: Return in about 3 months (around 03/09/2016) for Insomnia.

## 2015-12-08 NOTE — Assessment & Plan Note (Signed)
Checking labs today. Await results.  

## 2015-12-08 NOTE — Patient Instructions (Addendum)
Pneumococcal Polysaccharide Vaccine: What You Need to Know 1. Why get vaccinated? Vaccination can protect older adults (and some children and younger adults) from pneumococcal disease. Pneumococcal disease is caused by bacteria that can spread from person to person through close contact. It can cause ear infections, and it can also lead to more serious infections of the:   Lungs (pneumonia),  Blood (bacteremia), and  Covering of the brain and spinal cord (meningitis). Meningitis can cause deafness and brain damage, and it can be fatal. Anyone can get pneumococcal disease, but children under 62 years of age, people with certain medical conditions, adults over 68 years of age, and cigarette smokers are at the highest risk. About 18,000 older adults die each year from pneumococcal disease in the [place]. Treatment of pneumococcal infections with penicillin and other drugs used to be more effective. But some strains of the disease have become resistant to these drugs. This makes prevention of the disease, through vaccination, even more important. 2. Pneumococcal polysaccharide vaccine (PPSV23) Pneumococcal polysaccharide vaccine (PPSV23) protects against 23 types of pneumococcal bacteria. It will not prevent all pneumococcal disease. PPSV23 is recommended for:  All adults 6 years of age and older,  Anyone 2 through 80 years of age with certain long-term health problems,  Anyone 2 through 80 years of age with a weakened immune system,  Adults 64 through 80 years of age who smoke cigarettes or have asthma. Most people need only one dose of PPSV. A second dose is recommended for certain high-risk groups. People 53 and older should get a dose even if they have gotten one or more doses of the vaccine before they turned 65. Your healthcare provider can give you more information about these recommendations. Most healthy adults develop protection within 2 to 3 weeks of getting the shot. 3. Some  people should not get this vaccine  Anyone who has had a life-threatening allergic reaction to PPSV should not get another dose.  Anyone who has a severe allergy to any component of PPSV should not receive it. Tell your provider if you have any severe allergies.  Anyone who is moderately or severely ill when the shot is scheduled may be asked to wait until they recover before getting the vaccine. Someone with a mild illness can usually be vaccinated.  Children less than 83 years of age should not receive this vaccine.  There is no evidence that PPSV is harmful to either a pregnant woman or to her fetus. However, as a precaution, women who need the vaccine should be vaccinated before becoming pregnant, if possible. 4. Risks of a vaccine reaction With any medicine, including vaccines, there is a chance of side effects. These are usually mild and go away on their own, but serious reactions are also possible. About half of people who get PPSV have mild side effects, such as redness or pain where the shot is given, which go away within about two days. Less than 1 out of 100 people develop a fever, muscle aches, or more severe local reactions. Problems that could happen after any vaccine:  People sometimes faint after a medical procedure, including vaccination. Sitting or lying down for about 15 minutes can help prevent fainting, and injuries caused by a fall. Tell your doctor if you feel dizzy, or have vision changes or ringing in the ears.  Some people get severe pain in the shoulder and have difficulty moving the arm where a shot was given. This happens very rarely.  Any medication  can cause a severe allergic reaction. Such reactions from a vaccine are very rare, estimated at about 1 in a million doses, and would happen within a few minutes to a few hours after the vaccination. As with any medicine, there is a very remote chance of a vaccine causing a serious injury or death. The safety of  vaccines is always being monitored. For more information, visit: http://www.aguilar.org/ 5. What if there is a serious reaction? What should I look for? Look for anything that concerns you, such as signs of a severe allergic reaction, very high fever, or unusual behavior.  Signs of a severe allergic reaction can include hives, swelling of the face and throat, difficulty breathing, a fast heartbeat, dizziness, and weakness. These would usually start a few minutes to a few hours after the vaccination. What should I do? If you think it is a severe allergic reaction or other emergency that can't wait, call 9-1-1 or get to the nearest hospital. Otherwise, call your doctor. Afterward, the reaction should be reported to the Vaccine Adverse Event Reporting System (VAERS). Your doctor might file this report, or you can do it yourself through the VAERS web site at www.vaers.SamedayNews.es, or by calling 707-219-3835.  VAERS does not give medical advice. 6. How can I learn more?  Ask your doctor. He or she can give you the vaccine package insert or suggest other sources of information.  Call your local or state health department.  Contact the Centers for Disease Control and Prevention (CDC):  Call (812)745-0263 (1-800-CDC-INFO) or  Visit CDC's website at http://hunter.com/ CDC Pneumococcal Polysaccharide Vaccine VIS (08/01/13)   This information is not intended to replace advice given to you by your health care provider. Make sure you discuss any questions you have with your health care provider.   Document Released: 01/22/2006 Document Revised: 04/17/2014 Document Reviewed: 08/04/2013 Elsevier Interactive Patient Education 2016 Elsevier Inc. Influenza (Flu) Vaccine (Inactivated or Recombinant):  1. Why get vaccinated? Influenza ("flu") is a contagious disease that spreads around the [place] every year, usually between October and May. Flu is caused by influenza viruses, and is spread mainly  by coughing, sneezing, and close contact. Anyone can get flu. Flu strikes suddenly and can last several days. Symptoms vary by age, but can include:  fever/chills  sore throat  muscle aches  fatigue  cough  headache  runny or stuffy nose Flu can also lead to pneumonia and blood infections, and cause diarrhea and seizures in children. If you have a medical condition, such as heart or lung disease, flu can make it worse. Flu is more dangerous for some people. Infants and young children, people 43 years of age and older, pregnant women, and people with certain health conditions or a weakened immune system are at greatest risk. Each year thousands of people in the [place] die from flu, and many more are hospitalized. Flu vaccine can:  keep you from getting flu,  make flu less severe if you do get it, and  keep you from spreading flu to your family and other people. 2. Inactivated and recombinant flu vaccines A dose of flu vaccine is recommended every flu season. Children 6 months through 74 years of age may need two doses during the same flu season. Everyone else needs only one dose each flu season. Some inactivated flu vaccines contain a very small amount of a mercury-based preservative called thimerosal. Studies have not shown thimerosal in vaccines to be harmful, but flu vaccines that do not  contain thimerosal are available. There is no live flu virus in flu shots. They cannot cause the flu. There are many flu viruses, and they are always changing. Each year a new flu vaccine is made to protect against three or four viruses that are likely to cause disease in the upcoming flu season. But even when the vaccine doesn't exactly match these viruses, it may still provide some protection. Flu vaccine cannot prevent:  flu that is caused by a virus not covered by the vaccine, or  illnesses that look like flu but are not. It takes about 2 weeks for protection to develop after  vaccination, and protection lasts through the flu season. 3. Some people should not get this vaccine Tell the person who is giving you the vaccine:  If you have any severe, life-threatening allergies. If you ever had a life-threatening allergic reaction after a dose of flu vaccine, or have a severe allergy to any part of this vaccine, you may be advised not to get vaccinated. Most, but not all, types of flu vaccine contain a small amount of egg protein.  If you ever had Guillain-Barre Syndrome (also called GBS). Some people with a history of GBS should not get this vaccine. This should be discussed with your doctor.  If you are not feeling well. It is usually okay to get flu vaccine when you have a mild illness, but you might be asked to come back when you feel better. 4. Risks of a vaccine reaction With any medicine, including vaccines, there is a chance of reactions. These are usually mild and go away on their own, but serious reactions are also possible. Most people who get a flu shot do not have any problems with it. Minor problems following a flu shot include:  soreness, redness, or swelling where the shot was given  hoarseness  sore, red or itchy eyes  cough  fever  aches  headache  itching  fatigue If these problems occur, they usually begin soon after the shot and last 1 or 2 days. More serious problems following a flu shot can include the following:  There may be a small increased risk of Guillain-Barre Syndrome (GBS) after inactivated flu vaccine. This risk has been estimated at 1 or 2 additional cases per million people vaccinated. This is much lower than the risk of severe complications from flu, which can be prevented by flu vaccine.  Young children who get the flu shot along with pneumococcal vaccine (PCV13) and/or DTaP vaccine at the same time might be slightly more likely to have a seizure caused by fever. Ask your doctor for more information. Tell your doctor if a  child who is getting flu vaccine has ever had a seizure. Problems that could happen after any injected vaccine:  People sometimes faint after a medical procedure, including vaccination. Sitting or lying down for about 15 minutes can help prevent fainting, and injuries caused by a fall. Tell your doctor if you feel dizzy, or have vision changes or ringing in the ears.  Some people get severe pain in the shoulder and have difficulty moving the arm where a shot was given. This happens very rarely.  Any medication can cause a severe allergic reaction. Such reactions from a vaccine are very rare, estimated at about 1 in a million doses, and would happen within a few minutes to a few hours after the vaccination. As with any medicine, there is a very remote chance of a vaccine causing a serious injury  or death. The safety of vaccines is always being monitored. For more information, visit: http://www.aguilar.org/ 5. What if there is a serious reaction? What should I look for?  Look for anything that concerns you, such as signs of a severe allergic reaction, very high fever, or unusual behavior. Signs of a severe allergic reaction can include hives, swelling of the face and throat, difficulty breathing, a fast heartbeat, dizziness, and weakness. These would start a few minutes to a few hours after the vaccination. What should I do?  If you think it is a severe allergic reaction or other emergency that can't wait, call 9-1-1 and get the person to the nearest hospital. Otherwise, call your doctor.  Reactions should be reported to the Vaccine Adverse Event Reporting System (VAERS). Your doctor should file this report, or you can do it yourself through the VAERS web site at www.vaers.SamedayNews.es, or by calling (984) 136-9611. VAERS does not give medical advice. 6. The National Vaccine Injury Compensation Program The [employer] Vaccine Injury Compensation Program (VICP) is a federal program that was created to  compensate people who may have been injured by certain vaccines. Persons who believe they may have been injured by a vaccine can learn about the program and about filing a claim by calling 306-264-0393 or visiting the [employer] website at GoldCloset.com.ee. There is a time limit to file a claim for compensation. 7. How can I learn more?  Ask your healthcare provider. He or she can give you the vaccine package insert or suggest other sources of information.  Call your local or state health department.  Contact the Centers for Disease Control and Prevention (CDC):  Call 478-558-5008 (1-800-CDC-INFO) or  Visit CDC's website at https://gibson.com/ Vaccine Information Statement Inactivated Influenza Vaccine (11/14/2013)   This information is not intended to replace advice given to you by your health care provider. Make sure you discuss any questions you have with your health care provider.   Document Released: 01/19/2006 Document Revised: 04/17/2014 Document Reviewed: 11/17/2013 Elsevier Interactive Patient Education [employer].

## 2015-12-08 NOTE — Assessment & Plan Note (Signed)
Under good control. Continue current regimen. Continue to monitor.  

## 2015-12-09 LAB — COMPREHENSIVE METABOLIC PANEL
A/G RATIO: 2.1 (ref 1.2–2.2)
ALBUMIN: 4.4 g/dL (ref 3.5–4.7)
ALT: 10 IU/L (ref 0–32)
AST: 17 IU/L (ref 0–40)
Alkaline Phosphatase: 82 IU/L (ref 39–117)
BILIRUBIN TOTAL: 0.4 mg/dL (ref 0.0–1.2)
BUN / CREAT RATIO: 23 (ref 12–28)
BUN: 33 mg/dL — ABNORMAL HIGH (ref 8–27)
CO2: 22 mmol/L (ref 18–29)
Calcium: 9.5 mg/dL (ref 8.7–10.3)
Chloride: 104 mmol/L (ref 96–106)
Creatinine, Ser: 1.43 mg/dL — ABNORMAL HIGH (ref 0.57–1.00)
GFR calc Af Amer: 39 mL/min/{1.73_m2} — ABNORMAL LOW (ref >59)
GFR calc non Af Amer: 34 mL/min/{1.73_m2} — ABNORMAL LOW (ref >59)
Globulin, Total: 2.1 g/dL (ref 1.5–4.5)
Glucose: 96 mg/dL (ref 65–99)
POTASSIUM: 4.8 mmol/L (ref 3.5–5.2)
Sodium: 143 mmol/L (ref 134–144)
Total Protein: 6.5 g/dL (ref 6.0–8.5)

## 2015-12-09 LAB — CBC WITH DIFFERENTIAL/PLATELET
BASOS ABS: 0 10*3/uL (ref 0.0–0.2)
Basos: 0 %
EOS (ABSOLUTE): 0.3 10*3/uL (ref 0.0–0.4)
EOS: 5 %
HEMATOCRIT: 35.5 % (ref 34.0–46.6)
HEMOGLOBIN: 11.6 g/dL (ref 11.1–15.9)
Immature Grans (Abs): 0 10*3/uL (ref 0.0–0.1)
Immature Granulocytes: 0 %
Lymphocytes Absolute: 1.7 10*3/uL (ref 0.7–3.1)
Lymphs: 29 %
MCH: 29.9 pg (ref 26.6–33.0)
MCHC: 32.7 g/dL (ref 31.5–35.7)
MCV: 92 fL (ref 79–97)
MONOCYTES: 8 %
Monocytes Absolute: 0.5 10*3/uL (ref 0.1–0.9)
NEUTROS ABS: 3.4 10*3/uL (ref 1.4–7.0)
Neutrophils: 58 %
Platelets: 225 10*3/uL (ref 150–379)
RBC: 3.88 x10E6/uL (ref 3.77–5.28)
RDW: 13.4 % (ref 12.3–15.4)
WBC: 6 10*3/uL (ref 3.4–10.8)

## 2015-12-10 ENCOUNTER — Telehealth: Payer: Self-pay | Admitting: Family Medicine

## 2015-12-10 DIAGNOSIS — N289 Disorder of kidney and ureter, unspecified: Secondary | ICD-10-CM

## 2015-12-10 NOTE — Telephone Encounter (Signed)
Called and let patient know about labs and Dr. Durenda Age instructions. Patient stated she would stop by in 2 weeks for labs.

## 2015-12-10 NOTE — Telephone Encounter (Signed)
Can you please let her know that her labs look normal except her kidney function is up a bit. This is likely because she's a little dehydrated. I'd like her to push fluids and come back in about 2 weeks to get her labs checked again. Thanks!

## 2015-12-15 ENCOUNTER — Ambulatory Visit: Payer: Medicare Other | Admitting: Family Medicine

## 2015-12-28 ENCOUNTER — Other Ambulatory Visit: Payer: Medicare Other

## 2015-12-28 DIAGNOSIS — N289 Disorder of kidney and ureter, unspecified: Secondary | ICD-10-CM | POA: Diagnosis not present

## 2015-12-28 DIAGNOSIS — C4441 Basal cell carcinoma of skin of scalp and neck: Secondary | ICD-10-CM | POA: Diagnosis not present

## 2015-12-28 DIAGNOSIS — L905 Scar conditions and fibrosis of skin: Secondary | ICD-10-CM | POA: Diagnosis not present

## 2015-12-29 ENCOUNTER — Telehealth: Payer: Self-pay | Admitting: Family Medicine

## 2015-12-29 LAB — BASIC METABOLIC PANEL
BUN/Creatinine Ratio: 19 (ref 12–28)
BUN: 25 mg/dL (ref 8–27)
CALCIUM: 9.5 mg/dL (ref 8.7–10.3)
CO2: 19 mmol/L (ref 18–29)
Chloride: 104 mmol/L (ref 96–106)
Creatinine, Ser: 1.31 mg/dL — ABNORMAL HIGH (ref 0.57–1.00)
GFR calc non Af Amer: 37 mL/min/{1.73_m2} — ABNORMAL LOW (ref >59)
GFR, EST AFRICAN AMERICAN: 43 mL/min/{1.73_m2} — AB (ref >59)
Glucose: 96 mg/dL (ref 65–99)
POTASSIUM: 4.3 mmol/L (ref 3.5–5.2)
Sodium: 140 mmol/L (ref 134–144)

## 2015-12-29 NOTE — Telephone Encounter (Signed)
Patient notified

## 2015-12-29 NOTE — Telephone Encounter (Signed)
Please let her know that her labs came back closer to normal- just keep pushing fluids and we'll recheck it next time.

## 2015-12-29 NOTE — Telephone Encounter (Signed)
Called patient no answer, left message to return my call.

## 2016-03-06 ENCOUNTER — Ambulatory Visit (INDEPENDENT_AMBULATORY_CARE_PROVIDER_SITE_OTHER): Payer: Medicare Other | Admitting: Family Medicine

## 2016-03-06 ENCOUNTER — Encounter: Payer: Self-pay | Admitting: Family Medicine

## 2016-03-06 VITALS — BP 147/78 | HR 75 | Temp 97.5°F | Wt 170.4 lb

## 2016-03-06 DIAGNOSIS — M25461 Effusion, right knee: Secondary | ICD-10-CM | POA: Diagnosis not present

## 2016-03-06 NOTE — Patient Instructions (Addendum)
Knee Effusion Introduction Knee effusion means that you have excess fluid in your knee joint. This can cause pain and swelling in your knee. This may make your knee more difficult to bend and move. That is because there is increased pain and pressure in the joint. If there is fluid in your knee, it often means that something is wrong inside your knee, such as severe arthritis, abnormal inflammation, or an infection. Another common cause of knee effusion is an injury to the knee muscles, ligaments, or cartilage. Follow these instructions at home:  Use crutches as directed by your health care provider.  Wear a knee brace as directed by your health care provider.  Apply ice to the swollen area:  Put ice in a plastic bag.  Place a towel between your skin and the bag.  Leave the ice on for 20 minutes, 2-3 times per day.  Keep your knee raised (elevated) when you are sitting or lying down.  Take medicines only as directed by your health care provider.  Do any rehabilitation or strengthening exercises as directed by your health care provider.  Rest your knee as directed by your health care provider. You may start doing your normal activities again when your health care provider approves.  Keep all follow-up visits as directed by your health care provider. This is important. Contact a health care provider if:  You have ongoing (persistent) pain in your knee. Get help right away if:  You have increased swelling or redness of your knee.  You have severe pain in your knee.  You have a fever. This information is not intended to replace advice given to you by your health care provider. Make sure you discuss any questions you have with your health care provider. Document Released: 06/17/2003 Document Revised: 09/02/2015 Document Reviewed: 11/10/2013  2017 Elsevier

## 2016-03-06 NOTE — Progress Notes (Signed)
BP (!) 147/78 (BP Location: Left Arm, Patient Position: Sitting, Cuff Size: Normal)   Pulse 75   Temp 97.5 F (36.4 C)   Wt 170 lb 6.4 oz (77.3 kg)   SpO2 97%   BMI 28.44 kg/m    Subjective:    Patient ID: Faith Cox, female    DOB: 03/08/1930, 82 y.o.   MRN: [id number]  HPI: Faith Cox is a 82 y.o. female  Chief Complaint  Patient presents with  . Knee Pain    right knee pain, patient states that it started about a month ago, but it has gotten worse   KNEE PAIN Duration: couple of months, got worse on Friday and started to swell Involved knee: right Mechanism of injury: unknown Location:diffuse Onset: sudden Severity: severe  Quality:  Dull aching Frequency: constant Radiation: no Aggravating factors: weight bearing, walking, running and stairs  Alleviating factors: NSAIDs  Status: worse Treatments attempted: ibuprofen and aleve  Relief with NSAIDs?:  moderate Weakness with weight bearing or walking: no Sensation of giving way: no Locking: no Popping: no Bruising: no Swelling: yes Redness: no Paresthesias/decreased sensation: no Fevers: no  Relevant past medical, surgical, family and social history reviewed and updated as indicated. Interim medical history since our last visit reviewed. Allergies and medications reviewed and updated.  Review of Systems  Constitutional: Negative.   Respiratory: Negative.   Cardiovascular: Negative.   Musculoskeletal: Positive for arthralgias and joint swelling. Negative for back pain, gait problem, myalgias, neck pain and neck stiffness.  Psychiatric/Behavioral: Negative.     Per HPI unless specifically indicated above     Objective:    BP (!) 147/78 (BP Location: Left Arm, Patient Position: Sitting, Cuff Size: Normal)   Pulse 75   Temp 97.5 F (36.4 C)   Wt 170 lb 6.4 oz (77.3 kg)   SpO2 97%   BMI 28.44 kg/m   Wt Readings from Last 3 Encounters:  03/06/16 170 lb 6.4 oz (77.3 kg)  12/08/15 162 lb 3.2 oz  (73.6 kg)  06/01/15 160 lb (72.6 kg)    Physical Exam  Constitutional: She is oriented to person, place, and time. She appears well-developed and well-nourished. No distress.  HENT:  Head: Normocephalic and atraumatic.  Right Ear: Hearing normal.  Left Ear: Hearing normal.  Nose: Nose normal.  Eyes: Conjunctivae and lids are normal. Right eye exhibits no discharge. Left eye exhibits no discharge. No scleral icterus.  Pulmonary/Chest: Effort normal. No respiratory distress.  Musculoskeletal: She exhibits edema and tenderness. She exhibits no deformity.  Negative anterior and posterior drawer, Negative appley's compression and distraction, + effusion, + tenderness along joint line  Neurological: She is alert and oriented to person, place, and time.  Skin: Skin is warm, dry and intact. No rash noted. No erythema. No pallor.  Psychiatric: She has a normal mood and affect. Her speech is normal and behavior is normal. Judgment and thought content normal. Cognition and memory are normal.    Results for orders placed or performed in visit on [id number]  Basic metabolic panel  Result Value Ref Range   Glucose 96 65 - 99 mg/dL   BUN 25 8 - 27 mg/dL   Creatinine, Ser 1.31 (H) 0.57 - 1.00 mg/dL   GFR calc non Af Amer 37 (L) >59 mL/min/1.73   GFR calc Af Amer 43 (L) >59 mL/min/1.73   BUN/Creatinine Ratio 19 12 - 28   Sodium 140 134 - 144 mmol/L   Potassium 4.3 3.5 -  5.2 mmol/L   Chloride 104 96 - 106 mmol/L   CO2 19 18 - 29 mmol/L   Calcium 9.5 8.7 - 10.3 mg/dL      Assessment & Plan:   Problem List Items Addressed This Visit    None    Visit Diagnoses    Knee effusion, right    -  Primary   Has failed conservative therapy at home. Will give her steroid injection today. Done today as below.      Procedure: Right  Knee Intraarticular Steroid Injection        Diagnosis:   ICD-9-CM ICD-10-CM   1. Knee effusion, right 719.06 M25.461    Has failed conservative therapy at home. Will give  her steroid injection today. Done today as below.    Physician: Faith Cox Consent:  Risks, benefits, and alternative treatments discussed and all questions were answered.  Patient elected to proceed and verbal consent obtained.  Description: Area prepped and draped using  semi-sterile technique.  Using a anterior/lateral approach, a mixture of 2 cc of  1% lidocaine & 1 cc of Kenalog 40 was injected into knee joint.  A bandage was then placed over the injection site. Complications: none Post Procedure Instructions: Wound care instructions discussed and patient was instructed to keep area clean and dry.  Signs and symptoms of infection discussed, patient agrees to contact the office ASAP should they occur.   Follow up plan: Return As scheduled for follow up.

## 2016-03-09 ENCOUNTER — Ambulatory Visit: Payer: Medicare Other | Admitting: Family Medicine

## 2016-03-13 ENCOUNTER — Encounter: Payer: Self-pay | Admitting: Family Medicine

## 2016-03-13 ENCOUNTER — Ambulatory Visit
Admission: RE | Admit: 2016-03-13 | Discharge: 2016-03-13 | Disposition: A | Discharge status: Home / Self Care | Payer: Medicare Other | Source: Ambulatory Visit | Attending: Family Medicine | Admitting: Family Medicine

## 2016-03-13 ENCOUNTER — Ambulatory Visit (INDEPENDENT_AMBULATORY_CARE_PROVIDER_SITE_OTHER): Payer: Medicare Other | Admitting: Family Medicine

## 2016-03-13 ENCOUNTER — Telehealth: Payer: Self-pay | Admitting: Family Medicine

## 2016-03-13 VITALS — BP 126/67 | HR 73 | Temp 97.5°F | Ht 65.5 in | Wt 172.2 lb

## 2016-03-13 DIAGNOSIS — M25561 Pain in right knee: Secondary | ICD-10-CM | POA: Diagnosis not present

## 2016-03-13 DIAGNOSIS — F5102 Adjustment insomnia: Secondary | ICD-10-CM | POA: Diagnosis not present

## 2016-03-13 DIAGNOSIS — M1711 Unilateral primary osteoarthritis, right knee: Secondary | ICD-10-CM | POA: Insufficient documentation

## 2016-03-13 DIAGNOSIS — M8580 Other specified disorders of bone density and structure, unspecified site: Secondary | ICD-10-CM | POA: Insufficient documentation

## 2016-03-13 MED ORDER — TRAMADOL HCL 50 MG PO TABS: 50.0000 mg | ORAL_TABLET | Freq: Three times a day (TID) | ORAL | 0 refills | Status: DC | PRN

## 2016-03-13 MED ORDER — PREDNISONE 50 MG PO TABS: ORAL_TABLET | ORAL | 0 refills | Status: DC

## 2016-03-13 NOTE — Telephone Encounter (Signed)
Please let her know that her x-ray showed some arthritis, but nothing broken. We'll have her see ortho

## 2016-03-13 NOTE — Telephone Encounter (Signed)
Patient notified

## 2016-03-13 NOTE — Telephone Encounter (Signed)
Left message for patient to return my call.

## 2016-03-13 NOTE — Progress Notes (Signed)
BP 126/67 (BP Location: Left Arm, Patient Position: Sitting, Cuff Size: Normal)   Pulse 73   Temp 97.5 F (36.4 C)   Ht 5' 5.5" (1.664 m)   Wt 172 lb 3.2 oz (78.1 kg)   SpO2 95%   BMI 28.22 kg/m    Subjective:    Patient ID: Faith Cox, female    DOB: 12-18-1929, 82 y.o.   MRN: [id number]  HPI: Faith Cox is a 82 y.o. female  Chief Complaint  Patient presents with  . Knee Pain    Right knee. No better, worse from visit 03/06/16.   KNEE PAIN- Notes that her knee felt a lot better after her shot on Monday, but then got worse starting Thursday and Friday and now can barely walk on it. Feeling terrible.  Duration: couple of months, got worse again Friday and started to swell Involved knee: right Mechanism of injury:  Location:diffuse Onset: sudden Severity: severe  Quality:  Dull aching Frequency: constant Radiation: no Aggravating factors: weight bearing, walking, running and stairs  Alleviating factors: NSAIDs  Status: worse Treatments attempted: ibuprofen and aleve  Relief with NSAIDs?:  moderate Weakness with weight bearing or walking: no Sensation of giving way: no Locking: no Popping: no Bruising: no Swelling: yes Redness: no Paresthesias/decreased sensation: no Fevers: no  Trazodone made her stay awake. Now sleeping well. No concerns.   Relevant past medical, surgical, family and social history reviewed and updated as indicated. Interim medical history since our last visit reviewed. Allergies and medications reviewed and updated.  Review of Systems  Constitutional: Negative.   Respiratory: Negative.   Cardiovascular: Negative.     Per HPI unless specifically indicated above     Objective:    BP 126/67 (BP Location: Left Arm, Patient Position: Sitting, Cuff Size: Normal)   Pulse 73   Temp 97.5 F (36.4 C)   Ht 5' 5.5" (1.664 m)   Wt 172 lb 3.2 oz (78.1 kg)   SpO2 95%   BMI 28.22 kg/m   Wt Readings from Last 3 Encounters:  03/13/16 172  lb 3.2 oz (78.1 kg)  03/06/16 170 lb 6.4 oz (77.3 kg)  12/08/15 162 lb 3.2 oz (73.6 kg)    Physical Exam  Constitutional: She is oriented to person, place, and time. She appears well-developed and well-nourished. No distress.  HENT:  Head: Normocephalic and atraumatic.  Right Ear: Hearing normal.  Left Ear: Hearing normal.  Nose: Nose normal.  Eyes: Conjunctivae and lids are normal. Right eye exhibits no discharge. Left eye exhibits no discharge. No scleral icterus.  Pulmonary/Chest: Effort normal. No respiratory distress.  Musculoskeletal: She exhibits edema, tenderness and deformity.  Neurological: She is alert and oriented to person, place, and time.  Skin: Skin is warm, dry and intact. No rash noted. No erythema. No pallor.  Psychiatric: She has a normal mood and affect. Her speech is normal and behavior is normal. Judgment and thought content normal. Cognition and memory are normal.  Nursing note and vitals reviewed.   Results for orders placed or performed in visit on [id number]  Basic metabolic panel  Result Value Ref Range   Glucose 96 65 - 99 mg/dL   BUN 25 8 - 27 mg/dL   Creatinine, Ser 1.31 (H) 0.57 - 1.00 mg/dL   GFR calc non Af Amer 37 (L) >59 mL/min/1.73   GFR calc Af Amer 43 (L) >59 mL/min/1.73   BUN/Creatinine Ratio 19 12 - 28   Sodium 140 134 -  144 mmol/L   Potassium 4.3 3.5 - 5.2 mmol/L   Chloride 104 96 - 106 mmol/L   CO2 19 18 - 29 mmol/L   Calcium 9.5 8.7 - 10.3 mg/dL      Assessment & Plan:   Problem List Items Addressed This Visit    None    Visit Diagnoses    Acute pain of right knee    -  Primary   Worse. Will obtain x-ray, prednisone and tramadol. Will have her see orthopedics. Referral generated today and appointment set up.    Relevant Orders   DG Knee Complete 4 Views Right   Ambulatory referral to Orthopedic Surgery       Follow up plan: Return Februarary, for Medicare Wellness.

## 2016-03-14 DIAGNOSIS — M25569 Pain in unspecified knee: Secondary | ICD-10-CM | POA: Insufficient documentation

## 2016-03-14 DIAGNOSIS — M1711 Unilateral primary osteoarthritis, right knee: Secondary | ICD-10-CM | POA: Diagnosis not present

## 2016-04-18 DIAGNOSIS — Z85828 Personal history of other malignant neoplasm of skin: Secondary | ICD-10-CM | POA: Diagnosis not present

## 2016-04-18 DIAGNOSIS — L298 Other pruritus: Secondary | ICD-10-CM | POA: Diagnosis not present

## 2016-04-18 DIAGNOSIS — L820 Inflamed seborrheic keratosis: Secondary | ICD-10-CM | POA: Diagnosis not present

## 2016-04-18 DIAGNOSIS — C44519 Basal cell carcinoma of skin of other part of trunk: Secondary | ICD-10-CM | POA: Diagnosis not present

## 2016-04-18 DIAGNOSIS — Z08 Encounter for follow-up examination after completed treatment for malignant neoplasm: Secondary | ICD-10-CM | POA: Diagnosis not present

## 2016-04-18 DIAGNOSIS — D485 Neoplasm of uncertain behavior of skin: Secondary | ICD-10-CM | POA: Diagnosis not present

## 2016-04-18 DIAGNOSIS — C44319 Basal cell carcinoma of skin of other parts of face: Secondary | ICD-10-CM | POA: Diagnosis not present

## 2016-04-18 DIAGNOSIS — L538 Other specified erythematous conditions: Secondary | ICD-10-CM | POA: Diagnosis not present

## 2016-04-21 DIAGNOSIS — M25561 Pain in right knee: Secondary | ICD-10-CM | POA: Diagnosis not present

## 2016-04-21 DIAGNOSIS — R609 Edema, unspecified: Secondary | ICD-10-CM | POA: Diagnosis not present

## 2016-04-21 DIAGNOSIS — M25661 Stiffness of right knee, not elsewhere classified: Secondary | ICD-10-CM | POA: Diagnosis not present

## 2016-04-25 DIAGNOSIS — M25561 Pain in right knee: Secondary | ICD-10-CM | POA: Diagnosis not present

## 2016-04-25 DIAGNOSIS — M25661 Stiffness of right knee, not elsewhere classified: Secondary | ICD-10-CM | POA: Diagnosis not present

## 2016-05-02 DIAGNOSIS — R609 Edema, unspecified: Secondary | ICD-10-CM | POA: Diagnosis not present

## 2016-05-02 DIAGNOSIS — M25661 Stiffness of right knee, not elsewhere classified: Secondary | ICD-10-CM | POA: Diagnosis not present

## 2016-05-02 DIAGNOSIS — M25561 Pain in right knee: Secondary | ICD-10-CM | POA: Diagnosis not present

## 2016-05-04 DIAGNOSIS — M25661 Stiffness of right knee, not elsewhere classified: Secondary | ICD-10-CM | POA: Diagnosis not present

## 2016-05-04 DIAGNOSIS — M25561 Pain in right knee: Secondary | ICD-10-CM | POA: Diagnosis not present

## 2016-05-04 DIAGNOSIS — R609 Edema, unspecified: Secondary | ICD-10-CM | POA: Diagnosis not present

## 2016-05-09 DIAGNOSIS — R600 Localized edema: Secondary | ICD-10-CM | POA: Diagnosis not present

## 2016-05-09 DIAGNOSIS — M25661 Stiffness of right knee, not elsewhere classified: Secondary | ICD-10-CM | POA: Diagnosis not present

## 2016-05-09 DIAGNOSIS — M25561 Pain in right knee: Secondary | ICD-10-CM | POA: Diagnosis not present

## 2016-05-10 DIAGNOSIS — C44319 Basal cell carcinoma of skin of other parts of face: Secondary | ICD-10-CM | POA: Diagnosis not present

## 2016-05-10 DIAGNOSIS — Z85828 Personal history of other malignant neoplasm of skin: Secondary | ICD-10-CM | POA: Diagnosis not present

## 2016-05-18 DIAGNOSIS — C44519 Basal cell carcinoma of skin of other part of trunk: Secondary | ICD-10-CM | POA: Diagnosis not present

## 2016-06-06 ENCOUNTER — Ambulatory Visit (INDEPENDENT_AMBULATORY_CARE_PROVIDER_SITE_OTHER): Payer: Medicare Other | Admitting: Family Medicine

## 2016-06-06 ENCOUNTER — Encounter: Payer: Self-pay | Admitting: Family Medicine

## 2016-06-06 VITALS — BP 138/56 | HR 63 | Temp 97.9°F | Resp 17 | Ht 65.0 in | Wt 168.0 lb

## 2016-06-06 DIAGNOSIS — Z23 Encounter for immunization: Secondary | ICD-10-CM | POA: Diagnosis not present

## 2016-06-06 DIAGNOSIS — S81801A Unspecified open wound, right lower leg, initial encounter: Secondary | ICD-10-CM | POA: Diagnosis not present

## 2016-06-06 DIAGNOSIS — C4491 Basal cell carcinoma of skin, unspecified: Secondary | ICD-10-CM

## 2016-06-06 DIAGNOSIS — I129 Hypertensive chronic kidney disease with stage 1 through stage 4 chronic kidney disease, or unspecified chronic kidney disease: Secondary | ICD-10-CM | POA: Diagnosis not present

## 2016-06-06 DIAGNOSIS — E782 Mixed hyperlipidemia: Secondary | ICD-10-CM

## 2016-06-06 DIAGNOSIS — Z0000 Encounter for general adult medical examination without abnormal findings: Secondary | ICD-10-CM

## 2016-06-06 DIAGNOSIS — Z1239 Encounter for other screening for malignant neoplasm of breast: Secondary | ICD-10-CM

## 2016-06-06 DIAGNOSIS — Z1231 Encounter for screening mammogram for malignant neoplasm of breast: Secondary | ICD-10-CM | POA: Diagnosis not present

## 2016-06-06 LAB — MICROALBUMIN, URINE WAIVED
Creatinine, Urine Waived: 200 mg/dL (ref 10–300)
Microalb, Ur Waived: 10 mg/L (ref 0–19)

## 2016-06-06 LAB — UA/M W/RFLX CULTURE, ROUTINE
Bilirubin, UA: NEGATIVE
GLUCOSE, UA: NEGATIVE
KETONES UA: NEGATIVE
LEUKOCYTES UA: NEGATIVE
NITRITE UA: NEGATIVE
Protein, UA: NEGATIVE
RBC UA: NEGATIVE
Specific Gravity, UA: 1.01 (ref 1.005–1.030)
UUROB: 0.2 mg/dL (ref 0.2–1.0)
pH, UA: 6 (ref 5.0–7.5)

## 2016-06-06 MED ORDER — LOVASTATIN 20 MG PO TABS: 40.0000 mg | ORAL_TABLET | Freq: Every day | ORAL | 3 refills | Status: DC

## 2016-06-06 MED ORDER — LISINOPRIL-HYDROCHLOROTHIAZIDE 20-25 MG PO TABS: 1.0000 | ORAL_TABLET | Freq: Every day | ORAL | 3 refills | Status: DC

## 2016-06-06 NOTE — Assessment & Plan Note (Signed)
Rechecking levels today. Continue current regimen. Continue to monitor. Call with any concerns.  

## 2016-06-06 NOTE — Progress Notes (Deleted)
   Resp 17   Ht 5\' 5"  (1.651 m)   Wt 168 lb (76.2 kg)   BMI 27.96 kg/m    Subjective:    Patient ID: Faith Cox, female    DOB: [birth year], 83 y.o.   MRN: [id number]  HPI: Faith Cox is a 83 y.o. female  No chief complaint on file.  HYPERTENSION / HYPERLIPIDEMIA Satisfied with current treatment? {Blank single:19197::"yes","no"} Duration of hypertension: {Blank single:19197::"chronic","months","years"} BP monitoring frequency: {Blank single:19197::"not checking","rarely","daily","weekly","monthly","a few times a day","a few times a week","a few times a month"} BP range:  BP medication side effects: {Blank single:19197::"yes","no"} Past BP meds: {Blank A999333 (bystolic)","carvedilol","chlorthalidone","clonidine","diltiazem","exforge HCT","HCTZ","irbesartan (avapro)","labetalol","lisinopril","lisinopril-HCTZ","losartan (cozaar)","methyldopa","nifedipine","olmesartan (benicar)","olmesartan-HCTZ","quinapril","ramipril","spironalactone","tekturna","valsartan","valsartan-HCTZ","verapamil"} Duration of hyperlipidemia: {Blank single:19197::"chronic","months","years"} Cholesterol medication side effects: {Blank single:19197::"yes","no"} Cholesterol supplements: {Blank multiple:19196::"none","fish oil","niacin","red yeast rice"} Past cholesterol medications: {Blank multiple:19196::"none","atorvastain (lipitor)","lovastatin (mevacor)","pravastatin (pravachol)","rosuvastatin (crestor)","simvastatin (zocor)","vytorin","fenofibrate (tricor)","gemfibrozil","ezetimide (zetia)","niaspan","lovaza"} Medication compliance: {Blank single:19197::"excellent compliance","good compliance","fair compliance","poor compliance"} Aspirin: {Blank single:19197::"yes","no"} Recent stressors: {Blank single:19197::"yes","no"} Recurrent headaches: {Blank single:19197::"yes","no"} Visual changes: {Blank  single:19197::"yes","no"} Palpitations: {Blank single:19197::"yes","no"} Dyspnea: {Blank single:19197::"yes","no"} Chest pain: {Blank single:19197::"yes","no"} Lower extremity edema: {Blank single:19197::"yes","no"} Dizzy/lightheaded: {Blank single:19197::"yes","no"}   Relevant past medical, surgical, family and social history reviewed and updated as indicated. Interim medical history since our last visit reviewed. Allergies and medications reviewed and updated.  Review of Systems  Per HPI unless specifically indicated above     Objective:    Resp 17   Ht 5\' 5"  (1.651 m)   Wt 168 lb (76.2 kg)   BMI 27.96 kg/m   Wt Readings from Last 3 Encounters:  06/06/16 168 lb (76.2 kg)  03/13/16 172 lb 3.2 oz (78.1 kg)  03/06/16 170 lb 6.4 oz (77.3 kg)    Physical Exam  Results for orders placed or performed in visit on 99991111  Basic metabolic panel  Result Value Ref Range   Glucose 96 65 - 99 mg/dL   BUN 25 8 - 27 mg/dL   Creatinine, Ser 1.31 (H) 0.57 - 1.00 mg/dL   GFR calc non Af Amer 37 (L) >59 mL/min/1.73   GFR calc Af Amer 43 (L) >59 mL/min/1.73   BUN/Creatinine Ratio 19 12 - 28   Sodium 140 134 - 144 mmol/L   Potassium 4.3 3.5 - 5.2 mmol/L   Chloride 104 96 - 106 mmol/L   CO2 19 18 - 29 mmol/L   Calcium 9.5 8.7 - 10.3 mg/dL      Assessment & Plan:   Problem List Items Addressed This Visit      Genitourinary   Benign hypertensive renal disease - Primary   Relevant Orders   Comprehensive metabolic panel     Other   Hyperlipidemia   Relevant Orders   Comprehensive metabolic panel   Lipid Panel w/o Chol/HDL Ratio       Follow up plan: No Follow-up on file.

## 2016-06-06 NOTE — Assessment & Plan Note (Signed)
Stable. Continue to follow with dermatology. Call with any concerns.

## 2016-06-06 NOTE — Assessment & Plan Note (Signed)
Under good control on recheck. Continue current regimen. Continue to monitor. Call with any concerns.  

## 2016-06-06 NOTE — Patient Instructions (Addendum)
Preventative Services:  Health Risk Assessment and Personalized Prevention Plan: Done today Bone Mass Measurements: Declined Breast Cancer Screening: Due in June CVD Screening: Done today Cervical Cancer Screening: N/A Colon Cancer Screening: N/A Depression Screening: Done today Diabetes Screening: Done today Glaucoma Screening: See your eye doctor Hepatitis B vaccine: N/A Hepatitis C screening: N/A HIV Screening: N/A Flu Vaccine: Up to date Lung cancer Screening: N/A Obesity Screening: Done today Pneumonia Vaccines (2): Up to date STI Screening: N/A Health Maintenance, Female Adopting a healthy lifestyle and getting preventive care can go a long way to promote health and wellness. Talk with your health care provider about what schedule of regular examinations is right for you. This is a good chance for you to check in with your provider about disease prevention and staying healthy. In between checkups, there are plenty of things you can do on your own. Experts have done a lot of research about which lifestyle changes and preventive measures are most likely to keep you healthy. Ask your health care provider for more information. Weight and diet Eat a healthy diet  Be sure to include plenty of vegetables, fruits, low-fat dairy products, and lean protein.  Do not eat a lot of foods high in solid fats, added sugars, or salt.  Get regular exercise. This is one of the most important things you can do for your health.  Most adults should exercise for at least 150 minutes each week. The exercise should increase your heart rate and make you sweat (moderate-intensity exercise).  Most adults should also do strengthening exercises at least twice a week. This is in addition to the moderate-intensity exercise. Maintain a healthy weight  Body mass index (BMI) is a measurement that can be used to identify possible weight problems. It estimates body fat based on height and weight. Your health care  provider can help determine your BMI and help you achieve or maintain a healthy weight.  For females 40 years of age and older:  A BMI below 18.5 is considered underweight.  A BMI of 18.5 to 24.9 is normal.  A BMI of 25 to 29.9 is considered overweight.  A BMI of 30 and above is considered obese. Watch levels of cholesterol and blood lipids  You should start having your blood tested for lipids and cholesterol at 81 years of age, then have this test every 5 years.  You may need to have your cholesterol levels checked more often if:  Your lipid or cholesterol levels are high.  You are older than 81 years of age.  You are at high risk for heart disease. Cancer screening Lung Cancer  Lung cancer screening is recommended for adults 67-25 years old who are at high risk for lung cancer because of a history of smoking.  A yearly low-dose CT scan of the lungs is recommended for people who:  Currently smoke.  Have quit within the past 15 years.  Have at least a 30-pack-year history of smoking. A pack year is smoking an average of one pack of cigarettes a day for 1 year.  Yearly screening should continue until it has been 15 years since you quit.  Yearly screening should stop if you develop a health problem that would prevent you from having lung cancer treatment. Breast Cancer  Practice breast self-awareness. This means understanding how your breasts normally appear and feel.  It also means doing regular breast self-exams. Let your health care provider know about any changes, no matter how small.  If  you are in your 20s or 30s, you should have a clinical breast exam (CBE) by a health care provider every 1-3 years as part of a regular health exam.  If you are 70 or older, have a CBE every year. Also consider having a breast X-ray (mammogram) every year.  If you have a family history of breast cancer, talk to your health care provider about genetic screening.  If you are at high  risk for breast cancer, talk to your health care provider about having an MRI and a mammogram every year.  Breast cancer gene (BRCA) assessment is recommended for women who have family members with BRCA-related cancers. BRCA-related cancers include:  Breast.  Ovarian.  Tubal.  Peritoneal cancers.  Results of the assessment will determine the need for genetic counseling and BRCA1 and BRCA2 testing. Cervical Cancer  Your health care provider may recommend that you be screened regularly for cancer of the pelvic organs (ovaries, uterus, and vagina). This screening involves a pelvic examination, including checking for microscopic changes to the surface of your cervix (Pap test). You may be encouraged to have this screening done every 3 years, beginning at age 40.  For women ages 3-65, health care providers may recommend pelvic exams and Pap testing every 3 years, or they may recommend the Pap and pelvic exam, combined with testing for human papilloma virus (HPV), every 5 years. Some types of HPV increase your risk of cervical cancer. Testing for HPV may also be done on women of any age with unclear Pap test results.  Other health care providers may not recommend any screening for nonpregnant women who are considered low risk for pelvic cancer and who do not have symptoms. Ask your health care provider if a screening pelvic exam is right for you.  If you have had past treatment for cervical cancer or a condition that could lead to cancer, you need Pap tests and screening for cancer for at least 20 years after your treatment. If Pap tests have been discontinued, your risk factors (such as having a new sexual partner) need to be reassessed to determine if screening should resume. Some women have medical problems that increase the chance of getting cervical cancer. In these cases, your health care provider may recommend more frequent screening and Pap tests. Colorectal Cancer  This type of cancer can  be detected and often prevented.  Routine colorectal cancer screening usually begins at 81 years of age and continues through 81 years of age.  Your health care provider may recommend screening at an earlier age if you have risk factors for colon cancer.  Your health care provider may also recommend using home test kits to check for hidden blood in the stool.  A small camera at the end of a tube can be used to examine your colon directly (sigmoidoscopy or colonoscopy). This is done to check for the earliest forms of colorectal cancer.  Routine screening usually begins at age 64.  Direct examination of the colon should be repeated every 5-10 years through 81 years of age. However, you may need to be screened more often if early forms of precancerous polyps or small growths are found. Skin Cancer  Check your skin from head to toe regularly.  Tell your health care provider about any new moles or changes in moles, especially if there is a change in a mole's shape or color.  Also tell your health care provider if you have a mole that is larger than the  size of a pencil eraser.  Always use sunscreen. Apply sunscreen liberally and repeatedly throughout the day.  Protect yourself by wearing long sleeves, pants, a wide-brimmed hat, and sunglasses whenever you are outside. Heart disease, diabetes, and high blood pressure  High blood pressure causes heart disease and increases the risk of stroke. High blood pressure is more likely to develop in:  People who have blood pressure in the high end of the normal range (130-139/85-89 mm Hg).  People who are overweight or obese.  People who are African American.  If you are 84-18 years of age, have your blood pressure checked every 3-5 years. If you are 78 years of age or older, have your blood pressure checked every year. You should have your blood pressure measured twice-once when you are at a hospital or clinic, and once when you are not at a  hospital or clinic. Record the average of the two measurements. To check your blood pressure when you are not at a hospital or clinic, you can use:  An automated blood pressure machine at a pharmacy.  A home blood pressure monitor.  If you are between 99 years and 9 years old, ask your health care provider if you should take aspirin to prevent strokes.  Have regular diabetes screenings. This involves taking a blood sample to check your fasting blood sugar level.  If you are at a normal weight and have a low risk for diabetes, have this test once every three years after 81 years of age.  If you are overweight and have a high risk for diabetes, consider being tested at a younger age or more often. Preventing infection Hepatitis B  If you have a higher risk for hepatitis B, you should be screened for this virus. You are considered at high risk for hepatitis B if:  You were born in a country where hepatitis B is common. Ask your health care provider which countries are considered high risk.  Your parents were born in a high-risk country, and you have not been immunized against hepatitis B (hepatitis B vaccine).  You have HIV or AIDS.  You use needles to inject street drugs.  You live with someone who has hepatitis B.  You have had sex with someone who has hepatitis B.  You get hemodialysis treatment.  You take certain medicines for conditions, including cancer, organ transplantation, and autoimmune conditions. Hepatitis C  Blood testing is recommended for:  Everyone born from [birth year] through 1963.  Anyone with known risk factors for hepatitis C. Sexually transmitted infections (STIs)  You should be screened for sexually transmitted infections (STIs) including gonorrhea and chlamydia if:  You are sexually active and are younger than 81 years of age.  You are older than 81 years of age and your health care provider tells you that you are at risk for this type of  infection.  Your sexual activity has changed since you were last screened and you are at an increased risk for chlamydia or gonorrhea. Ask your health care provider if you are at risk.  If you do not have HIV, but are at risk, it may be recommended that you take a prescription medicine daily to prevent HIV infection. This is called pre-exposure prophylaxis (PrEP). You are considered at risk if:  You are sexually active and do not regularly use condoms or know the HIV status of your partner(s).  You take drugs by injection.  You are sexually active with a partner who has HIV.  Talk with your health care provider about whether you are at high risk of being infected with HIV. If you choose to begin PrEP, you should first be tested for HIV. You should then be tested every 3 months for as long as you are taking PrEP. Pregnancy  If you are premenopausal and you may become pregnant, ask your health care provider about preconception counseling.  If you may become pregnant, take 400 to 800 micrograms (mcg) of folic acid every day.  If you want to prevent pregnancy, talk to your health care provider about birth control (contraception). Osteoporosis and menopause  Osteoporosis is a disease in which the bones lose minerals and strength with aging. This can result in serious bone fractures. Your risk for osteoporosis can be identified using a bone density scan.  If you are 28 years of age or older, or if you are at risk for osteoporosis and fractures, ask your health care provider if you should be screened.  Ask your health care provider whether you should take a calcium or vitamin D supplement to lower your risk for osteoporosis.  Menopause may have certain physical symptoms and risks.  Hormone replacement therapy may reduce some of these symptoms and risks. Talk to your health care provider about whether hormone replacement therapy is right for you. Follow these instructions at home:  Schedule  regular health, dental, and eye exams.  Stay current with your immunizations.  Do not use any tobacco products including cigarettes, chewing tobacco, or electronic cigarettes.  If you are pregnant, do not drink alcohol.  If you are breastfeeding, limit how much and how often you drink alcohol.  Limit alcohol intake to no more than 1 drink per day for nonpregnant women. One drink equals 12 ounces of beer, 5 ounces of wine, or 1 ounces of hard liquor.  Do not use street drugs.  Do not share needles.  Ask your health care provider for help if you need support or information about quitting drugs.  Tell your health care provider if you often feel depressed.  Tell your health care provider if you have ever been abused or do not feel safe at home. This information is not intended to replace advice given to you by your health care provider. Make sure you discuss any questions you have with your health care provider. Document Released: 10/10/2010 Document Revised: 09/02/2015 Document Reviewed: 12/29/2014 Elsevier Interactive Patient Education  2017 [place] Maintenance for Postmenopausal Women Menopause is a normal process in which your reproductive ability comes to an end. This process happens gradually over a span of months to years, usually between the ages of 83 and 64. Menopause is complete when you have missed 12 consecutive menstrual periods. It is important to talk with your health care provider about some of the most common conditions that affect postmenopausal women, such as heart disease, cancer, and bone loss (osteoporosis). Adopting a healthy lifestyle and getting preventive care can help to promote your health and wellness. Those actions can also lower your chances of developing some of these common conditions. What should I know about menopause? During menopause, you may experience a number of symptoms, such as:  Moderate-to-severe hot flashes.  Night  sweats.  Decrease in sex drive.  Mood swings.  Headaches.  Tiredness.  Irritability.  Memory problems.  Insomnia. Choosing to treat or not to treat menopausal changes is an individual decision that you make with your health care provider. What should I know about hormone replacement  therapy and supplements? Hormone therapy products are effective for treating symptoms that are associated with menopause, such as hot flashes and night sweats. Hormone replacement carries certain risks, especially as you become older. If you are thinking about using estrogen or estrogen with progestin treatments, discuss the benefits and risks with your health care provider. What should I know about heart disease and stroke? Heart disease, heart attack, and stroke become more likely as you age. This may be due, in part, to the hormonal changes that your body experiences during menopause. These can affect how your body processes dietary fats, triglycerides, and cholesterol. Heart attack and stroke are both medical emergencies. There are many things that you can do to help prevent heart disease and stroke:  Have your blood pressure checked at least every 1-2 years. High blood pressure causes heart disease and increases the risk of stroke.  If you are 39-53 years old, ask your health care provider if you should take aspirin to prevent a heart attack or a stroke.  Do not use any tobacco products, including cigarettes, chewing tobacco, or electronic cigarettes. If you need help quitting, ask your health care provider.  It is important to eat a healthy diet and maintain a healthy weight.  Be sure to include plenty of vegetables, fruits, low-fat dairy products, and lean protein.  Avoid eating foods that are high in solid fats, added sugars, or salt (sodium).  Get regular exercise. This is one of the most important things that you can do for your health.  Try to exercise for at least 150 minutes each week. The  type of exercise that you do should increase your heart rate and make you sweat. This is known as moderate-intensity exercise.  Try to do strengthening exercises at least twice each week. Do these in addition to the moderate-intensity exercise.  Know your numbers.Ask your health care provider to check your cholesterol and your blood glucose. Continue to have your blood tested as directed by your health care provider. What should I know about cancer screening? There are several types of cancer. Take the following steps to reduce your risk and to catch any cancer development as early as possible. Breast Cancer  Practice breast self-awareness.  This means understanding how your breasts normally appear and feel.  It also means doing regular breast self-exams. Let your health care provider know about any changes, no matter how small.  If you are 72 or older, have a clinician do a breast exam (clinical breast exam or CBE) every year. Depending on your age, family history, and medical history, it may be recommended that you also have a yearly breast X-ray (mammogram).  If you have a family history of breast cancer, talk with your health care provider about genetic screening.  If you are at high risk for breast cancer, talk with your health care provider about having an MRI and a mammogram every year.  Breast cancer (BRCA) gene test is recommended for women who have family members with BRCA-related cancers. Results of the assessment will determine the need for genetic counseling and BRCA1 and for BRCA2 testing. BRCA-related cancers include these types:  Breast. This occurs in males or females.  Ovarian.  Tubal. This may also be called fallopian tube cancer.  Cancer of the abdominal or pelvic lining (peritoneal cancer).  Prostate.  Pancreatic. Cervical, Uterine, and Ovarian Cancer  Your health care provider may recommend that you be screened regularly for cancer of the pelvic organs. These  include your  ovaries, uterus, and vagina. This screening involves a pelvic exam, which includes checking for microscopic changes to the surface of your cervix (Pap test).  For women ages 21-65, health care providers may recommend a pelvic exam and a Pap test every three years. For women ages 62-65, they may recommend the Pap test and pelvic exam, combined with testing for human papilloma virus (HPV), every five years. Some types of HPV increase your risk of cervical cancer. Testing for HPV may also be done on women of any age who have unclear Pap test results.  Other health care providers may not recommend any screening for nonpregnant women who are considered low risk for pelvic cancer and have no symptoms. Ask your health care provider if a screening pelvic exam is right for you.  If you have had past treatment for cervical cancer or a condition that could lead to cancer, you need Pap tests and screening for cancer for at least 20 years after your treatment. If Pap tests have been discontinued for you, your risk factors (such as having a new sexual partner) need to be reassessed to determine if you should start having screenings again. Some women have medical problems that increase the chance of getting cervical cancer. In these cases, your health care provider may recommend that you have screening and Pap tests more often.  If you have a family history of uterine cancer or ovarian cancer, talk with your health care provider about genetic screening.  If you have vaginal bleeding after reaching menopause, tell your health care provider.  There are currently no reliable tests available to screen for ovarian cancer. Lung Cancer  Lung cancer screening is recommended for adults 45-66 years old who are at high risk for lung cancer because of a history of smoking. A yearly low-dose CT scan of the lungs is recommended if you:  Currently smoke.  Have a history of at least 30 pack-years of smoking and you  currently smoke or have quit within the past 15 years. A pack-year is smoking an average of one pack of cigarettes per day for one year. Yearly screening should:  Continue until it has been 15 years since you quit.  Stop if you develop a health problem that would prevent you from having lung cancer treatment. Colorectal Cancer  This type of cancer can be detected and can often be prevented.  Routine colorectal cancer screening usually begins at age 51 and continues through age 87.  If you have risk factors for colon cancer, your health care provider may recommend that you be screened at an earlier age.  If you have a family history of colorectal cancer, talk with your health care provider about genetic screening.  Your health care provider may also recommend using home test kits to check for hidden blood in your stool.  A small camera at the end of a tube can be used to examine your colon directly (sigmoidoscopy or colonoscopy). This is done to check for the earliest forms of colorectal cancer.  Direct examination of the colon should be repeated every 5-10 years until age 2. However, if early forms of precancerous polyps or small growths are found or if you have a family history or genetic risk for colorectal cancer, you may need to be screened more often. Skin Cancer  Check your skin from head to toe regularly.  Monitor any moles. Be sure to tell your health care provider:  About any new moles or changes in moles, especially if  there is a change in a mole's shape or color.  If you have a mole that is larger than the size of a pencil eraser.  If any of your family members has a history of skin cancer, especially at a young age, talk with your health care provider about genetic screening.  Always use sunscreen. Apply sunscreen liberally and repeatedly throughout the day.  Whenever you are outside, protect yourself by wearing long sleeves, pants, a wide-brimmed hat, and  sunglasses. What should I know about osteoporosis? Osteoporosis is a condition in which bone destruction happens more quickly than new bone creation. After menopause, you may be at an increased risk for osteoporosis. To help prevent osteoporosis or the bone fractures that can happen because of osteoporosis, the following is recommended:  If you are 41-81 years old, get at least 1,000 mg of calcium and at least 600 mg of vitamin D per day.  If you are older than age 74 but younger than age 77, get at least 1,200 mg of calcium and at least 600 mg of vitamin D per day.  If you are older than age 70, get at least 1,200 mg of calcium and at least 800 mg of vitamin D per day. Smoking and excessive alcohol intake increase the risk of osteoporosis. Eat foods that are rich in calcium and vitamin D, and do weight-bearing exercises several times each week as directed by your health care provider. What should I know about how menopause affects my mental health? Depression may occur at any age, but it is more common as you become older. Common symptoms of depression include:  Low or sad mood.  Changes in sleep patterns.  Changes in appetite or eating patterns.  Feeling an overall lack of motivation or enjoyment of activities that you previously enjoyed.  Frequent crying spells. Talk with your health care provider if you think that you are experiencing depression. What should I know about immunizations? It is important that you get and maintain your immunizations. These include:  Tetanus, diphtheria, and pertussis (Tdap) booster vaccine.  Influenza every year before the flu season begins.  Pneumonia vaccine.  Shingles vaccine. Your health care provider may also recommend other immunizations. This information is not intended to replace advice given to you by your health care provider. Make sure you discuss any questions you have with your health care provider. Document Released: 05/19/2005  Document Revised: 10/15/2015 Document Reviewed: 12/29/2014 Elsevier Interactive Patient Education  2017 [place] Directive Advance directives are legal documents that let you make choices ahead of time about your health care and medical treatment in case you become unable to communicate for yourself. Advance directives are a way for you to communicate your wishes to family, friends, and health care providers. This can help convey your decisions about end-of-life care if you become unable to communicate. Discussing and writing advance directives should happen over time rather than all at once. Advance directives can be changed depending on your situation and what you want, even after you have signed the advance directives. If you do not have an advance directive, some states assign family decision makers to act on your behalf based on how closely you are related to them. Each state has its own laws regarding advance directives. You may want to check with your health care provider, attorney, or state representative about the laws in your state. There are different types of advance directives, such as:  Medical power of attorney.  Living will.  Do not resuscitate (DNR) or do not attempt resuscitation (DNAR) order. Health care proxy and medical power of attorney A health care proxy, also called a health care agent, is a person who is appointed to make medical decisions for you in cases in which you are unable to make the decisions yourself. Generally, people choose someone they know well and trust to represent their preferences. Make sure to ask this person for an agreement to act as your proxy. A proxy may have to exercise judgment in the event of a medical decision for which your wishes are not known. A medical power of attorney is a legal document that names your health care proxy. Depending on the laws in your state, after the document is written, it may also need to  be:  Signed.  Notarized.  Dated.  Copied.  Witnessed.  Incorporated into your medical record. You may also want to appoint someone to manage your financial affairs in a situation in which you are unable to do so. This is called a durable power of attorney for finances. It is a separate legal document from the durable power of attorney for health care. You may choose the same person or someone different from your health care proxy to act as your agent in financial matters. If you do not appoint a proxy, or if there is a concern that the proxy is not acting in your best interests, a court-appointed guardian may be designated to act on your behalf. Living will A living will is a set of instructions documenting your wishes about medical care when you cannot express them yourself. Health care providers should keep a copy of your living will in your medical record. You may want to give a copy to family members or friends. To alert caregivers in case of an emergency, you can place a card in your wallet to let them know that you have a living will and where they can find it. A living will is used if you become:  Terminally ill.  Incapacitated.  Unable to communicate or make decisions. Items to consider in your living will include:  The use or non-use of life-sustaining equipment, such as dialysis machines and breathing machines (ventilators).  A DNR or DNAR order, which is the instruction not to use cardiopulmonary resuscitation (CPR) if breathing or heartbeat stops.  The use or non-use of tube feeding.  Withholding of food and fluids.  Comfort (palliative) care when the goal becomes comfort rather than a cure.  Organ and tissue donation. A living will does not give instructions for distributing your money and property if you should pass away. It is recommended that you seek the advice of a lawyer when writing a will. Decisions about taxes, beneficiaries, and asset distribution will be  legally binding. This process can relieve your family and friends of any concerns surrounding disputes or questions that may come up about the distribution of your assets. DNR or DNAR A DNR or DNAR order is a request not to have CPR in the event that your heart stops beating or you stop breathing. If a DNR or DNAR order has not been made and shared, a health care provider will try to help any patient whose heart has stopped or who has stopped breathing. If you plan to have surgery, talk with your health care provider about how your DNR or DNAR order will be followed if problems occur. Summary  Advance directives are the legal documents that allow you to make choices  ahead of time about your health care and medical treatment in case you become unable to communicate for yourself.  The process of discussing and writing advance directives should happen over time. You can change the advance directives, even after you have signed them.  Advance directives include DNR or DNAR orders, living wills, and designating an agent as your medical power of attorney. This information is not intended to replace advice given to you by your health care provider. Make sure you discuss any questions you have with your health care provider. Document Released: 07/04/2007 Document Revised: 02/14/2016 Document Reviewed: 02/14/2016 Elsevier Interactive Patient Education  2017 [employer] (Tetanus and Diphtheria): What You Need to Know 1. Why get vaccinated? Tetanus  and diphtheria are very serious diseases. They are rare in the [place] today, but people who do become infected often have severe complications. Td vaccine is used to protect adolescents and adults from both of these diseases. Both tetanus and diphtheria are infections caused by bacteria. Diphtheria spreads from person to person through coughing or sneezing. Tetanus-causing bacteria enter the body through cuts, scratches, or wounds. TETANUS  (lockjaw) causes painful muscle tightening and stiffness, usually all over the body.  It can lead to tightening of muscles in the head and neck so you can't open your mouth, swallow, or sometimes even breathe. Tetanus kills about 1 out of every 10 people who are infected even after receiving the best medical care. DIPHTHERIA can cause a thick coating to form in the back of the throat.  It can lead to breathing problems, paralysis, heart failure, and death. Before vaccines, as many as 200,000 cases of diphtheria and hundreds of cases of tetanus were reported in the [place] each year. Since vaccination began, reports of cases for both diseases have dropped by about 99%. 2. Td vaccine Td vaccine can protect adolescents and adults from tetanus and diphtheria. Td is usually given as a booster dose every 10 years but it can also be given earlier after a severe and dirty wound or burn. Another vaccine, called Tdap, which protects against pertussis in addition to tetanus and diphtheria, is sometimes recommended instead of Td vaccine. Your doctor or the person giving you the vaccine can give you more information. Td may safely be given at the same time as other vaccines. 3. Some people should not get this vaccine  A person who has ever had a life-threatening allergic reaction after a previous dose of any tetanus or diphtheria containing vaccine, OR has a severe allergy to any part of this vaccine, should not get Td vaccine. Tell the person giving the vaccine about any severe allergies.  Talk to your doctor if you:  had severe pain or swelling after any vaccine containing diphtheria or tetanus,  ever had a condition called Guillain Barre Syndrome (GBS),  aren't feeling well on the day the shot is scheduled. 4. What are the risks from Td vaccine? With any medicine, including vaccines, there is a chance of side effects. These are usually mild and go away on their own. Serious reactions are also  possible but are rare. Most people who get Td vaccine do not have any problems with it. Mild problems following Td vaccine:  (Did not interfere with activities)  Pain where the shot was given (about 8 people in 10)  Redness or swelling where the shot was given (about 1 person in 4)  Mild fever (rare)  Headache (about 1 person in 4)  Tiredness (about  1 person in 4) Moderate problems following Td vaccine:  (Interfered with activities, but did not require medical attention)  Fever over 102F (rare) Severe problems following Td vaccine:  (Unable to perform usual activities; required medical attention)  Swelling, severe pain, bleeding and/or redness in the arm where the shot was given (rare). Problems that could happen after any vaccine:   People sometimes faint after a medical procedure, including vaccination. Sitting or lying down for about 15 minutes can help prevent fainting, and injuries caused by a fall. Tell your doctor if you feel dizzy, or have vision changes or ringing in the ears.  Some people get severe pain in the shoulder and have difficulty moving the arm where a shot was given. This happens very rarely.  Any medication can cause a severe allergic reaction. Such reactions from a vaccine are very rare, estimated at fewer than 1 in a million doses, and would happen within a few minutes to a few hours after the vaccination. As with any medicine, there is a very remote chance of a vaccine causing a serious injury or death. The safety of vaccines is always being monitored. For more information, visit: http://www.aguilar.org/ 5. What if there is a serious reaction? What should I look for?  Look for anything that concerns you, such as signs of a severe allergic reaction, very high fever, or unusual behavior. Signs of a severe allergic reaction can include hives, swelling of the face and throat, difficulty breathing, a fast heartbeat, dizziness, and weakness. These would  usually start a few minutes to a few hours after the vaccination. What should I do?   If you think it is a severe allergic reaction or other emergency that can't wait, call 9-1-1 or get the person to the nearest hospital. Otherwise, call your doctor.  Afterward, the reaction should be reported to the Vaccine Adverse Event Reporting System (VAERS). Your doctor might file this report, or you can do it yourself through the VAERS web site at www.vaers.SamedayNews.es, or by calling [phone].  VAERS does not give medical advice. 6. The National Vaccine Injury Compensation Program The [employer] Vaccine Injury Compensation Program (VICP) is a federal program that was created to compensate people who may have been injured by certain vaccines. Persons who believe they may have been injured by a vaccine can learn about the program and about filing a claim by calling (863)280-1904 or visiting the [place] website at GoldCloset.com.ee. There is a time limit to file a claim for compensation. 7. How can I learn more?  Ask your doctor. He or she can give you the vaccine package insert or suggest other sources of information.  Call your local or state health department.  Contact the Centers for Disease Control and Prevention (CDC):  Call 6677122087 (1-800-CDC-INFO)  Visit CDC's website at http://hunter.com/ CDC Td Vaccine VIS (07/20/15) This information is not intended to replace advice given to you by your health care provider. Make sure you discuss any questions you have with your health care provider. Document Released: 01/22/2006 Document Revised: 12/16/2015 Document Reviewed: 12/16/2015 Elsevier Interactive Patient Education  2017 [employer].

## 2016-06-06 NOTE — Progress Notes (Signed)
BP (!) 138/56   Pulse 63   Temp 97.9 F (36.6 C) (Oral)   Resp 17   Ht 5\' 5"  (1.651 m)   Wt 168 lb (76.2 kg)   SpO2 100%   BMI 27.96 kg/m    Subjective:    Patient ID: Faith Cox, female    DOB: 01-23-1930, 83 y.o.   MRN: [id number]  HPI: Faith Cox is a 83 y.o. female presenting on 06/06/2016 for comprehensive medical examination. Current medical complaints include:  HYPERTENSION / HYPERLIPIDEMIA Satisfied with current treatment? yes Duration of hypertension: chronic BP monitoring frequency: not checking BP medication side effects: no Past BP meds: lisinopril-HCTZ Duration of hyperlipidemia: chronic Cholesterol medication side effects: no Cholesterol supplements: fish oil Past cholesterol medications: lovastatin (mevacor) Medication compliance: excellent compliance Aspirin: yes Recent stressors: no Recurrent headaches: no Visual changes: no Palpitations: no Dyspnea: no Chest pain: no Lower extremity edema: no Dizzy/lightheaded: no  She currently lives with: alone Menopausal Symptoms: no  Functional Status Survey: Is the patient deaf or have difficulty hearing?: Yes Does the patient have difficulty seeing, even when wearing glasses/contacts?: No Does the patient have difficulty concentrating, remembering, or making decisions?: No Does the patient have difficulty walking or climbing stairs?: No Does the patient have difficulty dressing or bathing?: No Does the patient have difficulty doing errands alone such as visiting a doctor's office or shopping?: No  Fall Risk  06/06/2016 06/01/2015  Falls in the past year? No No    Depression Screen Depression screen [facility] 2/9 06/06/2016 06/01/2015  Decreased Interest 0 0  Down, Depressed, Hopeless 0 0  PHQ - 2 Score 0 0   Advanced Directives Does patient have a HCPOA?    no Does patient have a living will or MOST form?  no  Past Medical History:  Past Medical History:  Diagnosis Date  . Hyperlipidemia   .  Hypertension     Surgical History:  Past Surgical History:  Procedure Laterality Date  . ABDOMINAL HYSTERECTOMY    . APPENDECTOMY    . bladder repair    . BREAST EXCISIONAL BIOPSY Left 08/09/1978  . GALLBLADDER SURGERY    . TOE SURGERY      Medications:  Current Outpatient Prescriptions on File Prior to Visit  Medication Sig  . aspirin 81 MG tablet Take 81 mg by mouth daily.  . Calcium Carb-Cholecalciferol [id number] MG-UNIT CAPS Take by mouth.  . IRON PO Take 500 mg by mouth daily.  . Omega-3 Fatty Acids (FISH OIL) 1000 MG CPDR Take by mouth.  . vitamin B-12 (CYANOCOBALAMIN) 1000 MCG tablet Take 1,000 mcg by mouth daily.   No current facility-administered medications on file prior to visit.     Allergies:  No Known Allergies  Social History:  Social History   Social History  . Marital status: Married    Spouse name: N/A  . Number of children: N/A  . Years of education: N/A   Occupational History  . Not on file.   Social History Main Topics  . Smoking status: Former Smoker    Quit date: 04/11/1979  . Smokeless tobacco: Never Used     Comment: quit 30 years ago   . Alcohol use 0.0 oz/week     Comment: beer on occasion  . Drug use: No  . Sexual activity: Not Currently   Other Topics Concern  . Not on file   Social History Narrative  . No narrative on file   History  Smoking Status  .  Former Smoker  . Quit date: 04/11/1979  Smokeless Tobacco  . Never Used    Comment: quit 30 years ago    History  Alcohol Use  . 0.0 oz/week    Comment: beer on occasion    Family History:  Family History  Problem Relation Age of Onset  . Pancreatic cancer Mother   . Stroke Father   . Alzheimer's disease Paternal Grandmother   . Leukemia Brother   . Breast cancer Neg Hx     Past medical history, surgical history, medications, allergies, family history and social history reviewed with patient today and changes made to appropriate areas of the chart.   Review of  Systems  Constitutional: Negative.   HENT: Negative.   Eyes: Negative.   Respiratory: Negative.   Cardiovascular: Negative.   Gastrointestinal: Negative.   Genitourinary: Negative.   Musculoskeletal: Negative.        R knee pain  Skin: Negative.   Neurological: Negative.   Endo/Heme/Allergies: Negative for environmental allergies and polydipsia. Bruises/bleeds easily.  Psychiatric/Behavioral: Negative.    All other ROS negative except what is listed above and in the HPI.      Objective:    BP (!) 138/56   Pulse 63   Temp 97.9 F (36.6 C) (Oral)   Resp 17   Ht 5\' 5"  (1.651 m)   Wt 168 lb (76.2 kg)   SpO2 100%   BMI 27.96 kg/m   Wt Readings from Last 3 Encounters:  06/06/16 168 lb (76.2 kg)  03/13/16 172 lb 3.2 oz (78.1 kg)  03/06/16 170 lb 6.4 oz (77.3 kg)     Hearing Screening   125Hz  250Hz  500Hz  1000Hz  2000Hz  3000Hz  4000Hz  6000Hz  8000Hz   Right ear:   40        Left ear:   25          Visual Acuity Screening   Right eye Left eye Both eyes  Without correction: 20/40 20/50 20/40   With correction:       Physical Exam  Constitutional: She is oriented to person, place, and time. She appears well-developed and well-nourished. No distress.  HENT:  Head: Normocephalic and atraumatic.  Right Ear: Hearing and external ear normal.  Left Ear: Hearing and external ear normal.  Nose: Nose normal.  Mouth/Throat: Oropharynx is clear and moist. No oropharyngeal exudate.  Eyes: Conjunctivae, EOM and lids are normal. Pupils are equal, round, and reactive to light. Right eye exhibits no discharge. Left eye exhibits no discharge. No scleral icterus.  Neck: Normal range of motion. Neck supple. No JVD present. No tracheal deviation present. No thyromegaly present.  Cardiovascular: Normal rate, regular rhythm, normal heart sounds and intact distal pulses.  Exam reveals no gallop and no friction rub.   No murmur heard. Pulmonary/Chest: Effort normal and breath sounds normal. No  stridor. No respiratory distress. She has no wheezes. She has no rales. She exhibits no tenderness.  Abdominal: Soft. Bowel sounds are normal. She exhibits no distension and no mass. There is no tenderness. There is no rebound and no guarding.  Genitourinary:  Genitourinary Comments: Breast and pelvic exams deferred  Musculoskeletal: Normal range of motion. She exhibits no edema, tenderness or deformity.  Lymphadenopathy:    She has no cervical adenopathy.  Neurological: She is alert and oriented to person, place, and time. She displays normal reflexes. No cranial nerve deficit. She exhibits normal muscle tone. Coordination normal.  Skin: Skin is warm, dry and intact. No rash noted. She is  not diaphoretic. No erythema. No pallor.  Well healing small open wound on R shin  Psychiatric: She has a normal mood and affect. Her speech is normal and behavior is normal. Judgment and thought content normal. Cognition and memory are normal.  Nursing note and vitals reviewed.   6CIT Screen 06/06/2016  What Year? 0 points  What month? 0 points  What time? 0 points  Count back from 20 0 points  Months in reverse 2 points  Repeat phrase 2 points  Total Score 4    Results for orders placed or performed in visit on [id number]  Basic metabolic panel  Result Value Ref Range   Glucose 96 65 - 99 mg/dL   BUN 25 8 - 27 mg/dL   Creatinine, Ser 1.31 (H) 0.57 - 1.00 mg/dL   GFR calc non Af Amer 37 (L) >59 mL/min/1.73   GFR calc Af Amer 43 (L) >59 mL/min/1.73   BUN/Creatinine Ratio 19 12 - 28   Sodium 140 134 - 144 mmol/L   Potassium 4.3 3.5 - 5.2 mmol/L   Chloride 104 96 - 106 mmol/L   CO2 19 18 - 29 mmol/L   Calcium 9.5 8.7 - 10.3 mg/dL      Assessment & Plan:   Problem List Items Addressed This Visit      Musculoskeletal and Integument   CA skin, basal cell    Stable. Continue to follow with dermatology. Call with any concerns.         Genitourinary   Benign hypertensive renal disease     Under good control on recheck. Continue current regimen. Continue to monitor. Call with any concerns.       Relevant Orders   Comprehensive metabolic panel   CBC with Differential/Platelet   TSH   UA/M w/rflx Culture, Routine   Microalbumin, Urine Waived     Other   Hyperlipidemia    Rechecking levels today. Continue current regimen. Continue to monitor. Call with any concerns.       Relevant Medications   lovastatin (MEVACOR) 20 MG tablet   lisinopril-hydrochlorothiazide (PRINZIDE,ZESTORETIC) 20-25 MG tablet   Other Relevant Orders   Comprehensive metabolic panel   Lipid Panel w/o Chol/HDL Ratio   CBC with Differential/Platelet    Other Visit Diagnoses    Encounter for Medicare annual wellness exam    -  Primary   Preventative Care discussed as below.    Screening for breast cancer       Due in June for mammogram. Ordered today.   Relevant Orders   MM DIGITAL SCREENING BILATERAL   Open wound of right lower leg, initial encounter       Healing well. Due for Td- given.    Relevant Orders   Td : Tetanus/diphtheria >7yo Preservative  free (Completed)       Preventative Services:  Health Risk Assessment and Personalized Prevention Plan: Done today Bone Mass Measurements: Declined Breast Cancer Screening: Due in June CVD Screening: Done today Cervical Cancer Screening: N/A Colon Cancer Screening: N/A Depression Screening: Done today Diabetes Screening: Done today Glaucoma Screening: See your eye doctor Hepatitis B vaccine: N/A Hepatitis C screening: N/A HIV Screening: N/A Flu Vaccine: Up to date Lung cancer Screening: N/A Obesity Screening: Done today Pneumonia Vaccines (2): Up to date STI Screening: N/A  Follow up plan: Return in about 6 months (around 12/04/2016) for BP/Cholesterol follow up.   LABORATORY TESTING:  - Pap smear: not applicable  IMMUNIZATIONS:   - Tdap: Tetanus vaccination status  reviewed: Td vaccination indicated and given today. -  Influenza: Up to date - Pneumovax: Up to date - Prevnar: Up to date - Zostavax vaccine: Not applicable  SCREENING: -Mammogram: Ordered today  - Colonoscopy: Not applicable  - Bone Density: Ordered today  -Hearing Test: Ordered today  -Spirometry: Not applicable   PATIENT COUNSELING:   Advised to take 1 mg of folate supplement per day if capable of pregnancy.   Sexuality: Discussed sexually transmitted diseases, partner selection, use of condoms, avoidance of unintended pregnancy  and contraceptive alternatives.   Advised to avoid cigarette smoking.  I discussed with the patient that most people either abstain from alcohol or drink within safe limits (<=14/week and <=4 drinks/occasion for males, <=7/weeks and <= 3 drinks/occasion for females) and that the risk for alcohol disorders and other health effects rises proportionally with the number of drinks per week and how often a drinker exceeds daily limits.  Discussed cessation/primary prevention of drug use and availability of treatment for abuse.   Diet: Encouraged to adjust caloric intake to maintain  or achieve ideal body weight, to reduce intake of dietary saturated fat and total fat, to limit sodium intake by avoiding high sodium foods and not adding table salt, and to maintain adequate dietary potassium and calcium preferably from fresh fruits, vegetables, and low-fat dairy products.    stressed the importance of regular exercise  Injury prevention: Discussed safety belts, safety helmets, smoke detector, smoking near bedding or upholstery.   Dental health: Discussed importance of regular tooth brushing, flossing, and dental visits.    NEXT PREVENTATIVE PHYSICAL DUE IN 1 YEAR. Return in about 6 months (around 12/04/2016) for BP/Cholesterol follow up.

## 2016-06-07 ENCOUNTER — Encounter: Payer: Self-pay | Admitting: Family Medicine

## 2016-06-07 LAB — LIPID PANEL W/O CHOL/HDL RATIO
Cholesterol, Total: 176 mg/dL (ref 100–199)
HDL: 44 mg/dL (ref >39)
LDL CALC: 76 mg/dL (ref 0–99)
Triglycerides: 281 mg/dL — ABNORMAL HIGH (ref 0–149)
VLDL Cholesterol Cal: 56 mg/dL — ABNORMAL HIGH (ref 5–40)

## 2016-06-07 LAB — COMPREHENSIVE METABOLIC PANEL
A/G RATIO: 2.1 (ref 1.2–2.2)
ALT: 14 IU/L (ref 0–32)
AST: 18 IU/L (ref 0–40)
Albumin: 4.6 g/dL (ref 3.5–4.7)
Alkaline Phosphatase: 87 IU/L (ref 39–117)
BUN/Creatinine Ratio: 18 (ref 12–28)
BUN: 26 mg/dL (ref 8–27)
Bilirubin Total: 0.4 mg/dL (ref 0.0–1.2)
CALCIUM: 9.8 mg/dL (ref 8.7–10.3)
CO2: 24 mmol/L (ref 18–29)
CREATININE: 1.43 mg/dL — AB (ref 0.57–1.00)
Chloride: 101 mmol/L (ref 96–106)
GFR, EST AFRICAN AMERICAN: 39 mL/min/{1.73_m2} — AB (ref >59)
GFR, EST NON AFRICAN AMERICAN: 34 mL/min/{1.73_m2} — AB (ref >59)
GLOBULIN, TOTAL: 2.2 g/dL (ref 1.5–4.5)
Glucose: 100 mg/dL — ABNORMAL HIGH (ref 65–99)
POTASSIUM: 4.7 mmol/L (ref 3.5–5.2)
SODIUM: 142 mmol/L (ref 134–144)
TOTAL PROTEIN: 6.8 g/dL (ref 6.0–8.5)

## 2016-06-07 LAB — CBC WITH DIFFERENTIAL/PLATELET
BASOS ABS: 0 10*3/uL (ref 0.0–0.2)
Basos: 0 %
EOS (ABSOLUTE): 0.5 10*3/uL — ABNORMAL HIGH (ref 0.0–0.4)
EOS: 6 %
HEMATOCRIT: 35.9 % (ref 34.0–46.6)
Hemoglobin: 12 g/dL (ref 11.1–15.9)
IMMATURE GRANULOCYTES: 0 %
Immature Grans (Abs): 0 10*3/uL (ref 0.0–0.1)
Lymphocytes Absolute: 2.4 10*3/uL (ref 0.7–3.1)
Lymphs: 29 %
MCH: 30.1 pg (ref 26.6–33.0)
MCHC: 33.4 g/dL (ref 31.5–35.7)
MCV: 90 fL (ref 79–97)
MONOS ABS: 0.6 10*3/uL (ref 0.1–0.9)
Monocytes: 7 %
NEUTROS PCT: 58 %
Neutrophils Absolute: 4.7 10*3/uL (ref 1.4–7.0)
PLATELETS: 230 10*3/uL (ref 150–379)
RBC: 3.99 x10E6/uL (ref 3.77–5.28)
RDW: 13.2 % (ref 12.3–15.4)
WBC: 8.2 10*3/uL (ref 3.4–10.8)

## 2016-06-07 LAB — TSH: TSH: 1.86 u[IU]/mL (ref 0.450–4.500)

## 2016-08-28 ENCOUNTER — Encounter: Payer: Self-pay | Admitting: Family Medicine

## 2016-08-28 ENCOUNTER — Ambulatory Visit (INDEPENDENT_AMBULATORY_CARE_PROVIDER_SITE_OTHER): Payer: Medicare Other | Admitting: Family Medicine

## 2016-08-28 VITALS — BP 115/65 | HR 78 | Temp 98.6°F | Wt 171.6 lb

## 2016-08-28 DIAGNOSIS — J189 Pneumonia, unspecified organism: Secondary | ICD-10-CM

## 2016-08-28 DIAGNOSIS — J181 Lobar pneumonia, unspecified organism: Secondary | ICD-10-CM

## 2016-08-28 MED ORDER — BENZONATATE 200 MG PO CAPS: 200.0000 mg | ORAL_CAPSULE | Freq: Two times a day (BID) | ORAL | 0 refills | Status: DC | PRN

## 2016-08-28 MED ORDER — DOXYCYCLINE HYCLATE 100 MG PO TABS: 100.0000 mg | ORAL_TABLET | Freq: Two times a day (BID) | ORAL | 0 refills | Status: DC

## 2016-08-28 NOTE — Progress Notes (Signed)
BP 115/65 (BP Location: Left Arm, Patient Position: Sitting, Cuff Size: Normal)   Pulse 78   Temp 98.6 F (37 C)   Wt 171 lb 9.6 oz (77.8 kg)   SpO2 98%   BMI 28.56 kg/m    Subjective:    Patient ID: Faith Cox, female    DOB: 1930/02/24, 83 y.o.   MRN: [id number]  HPI: Faith Cox is a 83 y.o. female  Chief Complaint  Patient presents with  . Cough    x's 5 days. Patient states cough is productive.   . Fatigue    Patient states she just feels all around bad.   UPPER RESPIRATORY TRACT INFECTION Duration: 5 days Worst symptom: coughing, chest congetions Fever: yes, 100.6 Cough: yes Shortness of breath: no Wheezing: no Chest pain: no Chest tightness: yes Chest congestion: yes Nasal congestion: no Runny nose: no Post nasal drip: no Sneezing: no Sore throat: no Swollen glands: no Sinus pressure: no Headache: yes Face pain: no Toothache: no Ear pain: no  Ear pressure: no  Eyes red/itching:no Eye drainage/crusting: no  Vomiting: no Rash: no Fatigue: yes Sick contacts: yes Strep contacts: no  Context: worse Recurrent sinusitis: no Relief with OTC cold/cough medications: no  Treatments attempted: mucinex   Relevant past medical, surgical, family and social history reviewed and updated as indicated. Interim medical history since our last visit reviewed. Allergies and medications reviewed and updated.  Review of Systems  Constitutional: Negative.   HENT: Negative.   Respiratory: Positive for cough, chest tightness and shortness of breath. Negative for apnea, choking, wheezing and stridor.   Cardiovascular: Negative.   Psychiatric/Behavioral: Negative.     Per HPI unless specifically indicated above     Objective:    BP 115/65 (BP Location: Left Arm, Patient Position: Sitting, Cuff Size: Normal)   Pulse 78   Temp 98.6 F (37 C)   Wt 171 lb 9.6 oz (77.8 kg)   SpO2 98%   BMI 28.56 kg/m   Wt Readings from Last 3 Encounters:  08/28/16 171 lb  9.6 oz (77.8 kg)  06/06/16 168 lb (76.2 kg)  03/13/16 172 lb 3.2 oz (78.1 kg)    Physical Exam  Constitutional: She is oriented to person, place, and time. She appears well-developed and well-nourished. No distress.  HENT:  Head: Normocephalic and atraumatic.  Right Ear: Hearing and external ear normal.  Left Ear: Hearing and external ear normal.  Nose: Nose normal.  Mouth/Throat: Oropharynx is clear and moist. No oropharyngeal exudate.  Eyes: Conjunctivae, EOM and lids are normal. Pupils are equal, round, and reactive to light. Right eye exhibits no discharge. Left eye exhibits no discharge. No scleral icterus.  Neck: Normal range of motion. Neck supple. No JVD present. No tracheal deviation present. No thyromegaly present.  Cardiovascular: Normal rate, regular rhythm, normal heart sounds and intact distal pulses.  Exam reveals no gallop and no friction rub.   No murmur heard. Pulmonary/Chest: Effort normal. No stridor. No respiratory distress. She has no wheezes. She has rhonchi in the left lower field. She has no rales. She exhibits no tenderness.  Musculoskeletal: Normal range of motion.  Lymphadenopathy:    She has no cervical adenopathy.  Neurological: She is alert and oriented to person, place, and time.  Skin: Skin is warm, dry and intact. No rash noted. She is not diaphoretic. No erythema. No pallor.  Psychiatric: She has a normal mood and affect. Her speech is normal and behavior is normal. Judgment and  thought content normal. Cognition and memory are normal.  Vitals reviewed.   Results for orders placed or performed in visit on 06/06/16  Comprehensive metabolic panel  Result Value Ref Range   Glucose 100 (H) 65 - 99 mg/dL   BUN 26 8 - 27 mg/dL   Creatinine, Ser 1.43 (H) 0.57 - 1.00 mg/dL   GFR calc non Af Amer 34 (L) >59 mL/min/1.73   GFR calc Af Amer 39 (L) >59 mL/min/1.73   BUN/Creatinine Ratio 18 12 - 28   Sodium 142 134 - 144 mmol/L   Potassium 4.7 3.5 - 5.2 mmol/L     Chloride 101 96 - 106 mmol/L   CO2 24 18 - 29 mmol/L   Calcium 9.8 8.7 - 10.3 mg/dL   Total Protein 6.8 6.0 - 8.5 g/dL   Albumin 4.6 3.5 - 4.7 g/dL   Globulin, Total 2.2 1.5 - 4.5 g/dL   Albumin/Globulin Ratio 2.1 1.2 - 2.2   Bilirubin Total 0.4 0.0 - 1.2 mg/dL   Alkaline Phosphatase 87 39 - 117 IU/L   AST 18 0 - 40 IU/L   ALT 14 0 - 32 IU/L  Lipid Panel w/o Chol/HDL Ratio  Result Value Ref Range   Cholesterol, Total 176 100 - 199 mg/dL   Triglycerides 281 (H) 0 - 149 mg/dL   HDL 44 >39 mg/dL   VLDL Cholesterol Cal 56 (H) 5 - 40 mg/dL   LDL Calculated 76 0 - 99 mg/dL  CBC with Differential/Platelet  Result Value Ref Range   WBC 8.2 3.4 - 10.8 x10E3/uL   RBC 3.99 3.77 - 5.28 x10E6/uL   Hemoglobin 12.0 11.1 - 15.9 g/dL   Hematocrit 35.9 34.0 - 46.6 %   MCV 90 79 - 97 fL   MCH 30.1 26.6 - 33.0 pg   MCHC 33.4 31.5 - 35.7 g/dL   RDW 13.2 12.3 - 15.4 %   Platelets 230 150 - 379 x10E3/uL   Neutrophils 58 Not Estab. %   Lymphs 29 Not Estab. %   Monocytes 7 Not Estab. %   Eos 6 Not Estab. %   Basos 0 Not Estab. %   Neutrophils Absolute 4.7 1.4 - 7.0 x10E3/uL   Lymphocytes Absolute 2.4 0.7 - 3.1 x10E3/uL   Monocytes Absolute 0.6 0.1 - 0.9 x10E3/uL   EOS (ABSOLUTE) 0.5 (H) 0.0 - 0.4 x10E3/uL   Basophils Absolute 0.0 0.0 - 0.2 x10E3/uL   Immature Granulocytes 0 Not Estab. %   Immature Grans (Abs) 0.0 0.0 - 0.1 x10E3/uL  TSH  Result Value Ref Range   TSH 1.860 0.450 - 4.500 uIU/mL  UA/M w/rflx Culture, Routine  Result Value Ref Range   Specific Gravity, UA 1.010 1.005 - 1.030   pH, UA 6.0 5.0 - 7.5   Color, UA Yellow Yellow   Appearance Ur Clear Clear   Leukocytes, UA Negative Negative   Protein, UA Negative Negative/Trace   Glucose, UA Negative Negative   Ketones, UA Negative Negative   RBC, UA Negative Negative   Bilirubin, UA Negative Negative   Urobilinogen, Ur 0.2 0.2 - 1.0 mg/dL   Nitrite, UA Negative Negative  Microalbumin, Urine Waived  Result Value Ref Range    Microalb, Ur Waived 10 0 - 19 mg/L   Creatinine, Urine Waived 200 10 - 300 mg/dL   Microalb/Creat Ratio <30 <30 mg/g      Assessment & Plan:   Problem List Items Addressed This Visit    None    Visit Diagnoses  Pneumonia of left lower lobe due to infectious organism [facility])    -  Primary   Will treat with doxycycline and recheck in 2 weeks. Tessalon perles for comfort. Call with any concerns.    Relevant Medications   doxycycline (VIBRA-TABS) 100 MG tablet   benzonatate (TESSALON) 200 MG capsule       Follow up plan: Return 2 weeks, for Lung recheck.

## 2016-09-12 ENCOUNTER — Ambulatory Visit (INDEPENDENT_AMBULATORY_CARE_PROVIDER_SITE_OTHER): Payer: Medicare Other | Admitting: Family Medicine

## 2016-09-12 ENCOUNTER — Encounter: Payer: Self-pay | Admitting: Family Medicine

## 2016-09-12 VITALS — BP 125/70 | HR 76 | Temp 97.7°F | Wt 170.9 lb

## 2016-09-12 DIAGNOSIS — J181 Lobar pneumonia, unspecified organism: Secondary | ICD-10-CM | POA: Diagnosis not present

## 2016-09-12 DIAGNOSIS — J189 Pneumonia, unspecified organism: Secondary | ICD-10-CM

## 2016-09-12 NOTE — Progress Notes (Signed)
BP 125/70 (BP Location: Left Arm, Patient Position: Sitting, Cuff Size: Normal)   Pulse 76   Temp 97.7 F (36.5 C)   Wt 170 lb 14.4 oz (77.5 kg)   BMI 28.44 kg/m    Subjective:    Patient ID: Faith Cox, female    DOB: [birth year], 83 y.o.   MRN: [id number]  HPI: Faith Cox is a 83 y.o. female  Chief Complaint  Patient presents with  . Lung Recheck   Feeling much better. Lungs clear. No coughing. Still very tired. Otherwise doing well. No other concerns or complaints at this time.   Relevant past medical, surgical, family and social history reviewed and updated as indicated. Interim medical history since our last visit reviewed. Allergies and medications reviewed and updated.  Review of Systems  Constitutional: Negative.   Respiratory: Negative.   Cardiovascular: Negative.   Psychiatric/Behavioral: Negative.     Per HPI unless specifically indicated above     Objective:    BP 125/70 (BP Location: Left Arm, Patient Position: Sitting, Cuff Size: Normal)   Pulse 76   Temp 97.7 F (36.5 C)   Wt 170 lb 14.4 oz (77.5 kg)   BMI 28.44 kg/m   Wt Readings from Last 3 Encounters:  09/12/16 170 lb 14.4 oz (77.5 kg)  08/28/16 171 lb 9.6 oz (77.8 kg)  06/06/16 168 lb (76.2 kg)    Physical Exam  Constitutional: She is oriented to person, place, and time. She appears well-developed and well-nourished. No distress.  HENT:  Head: Normocephalic and atraumatic.  Right Ear: Hearing normal.  Left Ear: Hearing normal.  Nose: Nose normal.  Eyes: Conjunctivae and lids are normal. Right eye exhibits no discharge. Left eye exhibits no discharge. No scleral icterus.  Cardiovascular: Normal rate, regular rhythm, normal heart sounds and intact distal pulses.  Exam reveals no gallop and no friction rub.   No murmur heard. Pulmonary/Chest: Effort normal and breath sounds normal. No respiratory distress. She has no wheezes. She has no rales. She exhibits no tenderness.    Musculoskeletal: Normal range of motion.  Neurological: She is alert and oriented to person, place, and time.  Skin: Skin is warm and intact. No rash noted. She is not diaphoretic. No erythema. No pallor.  Psychiatric: She has a normal mood and affect. Her speech is normal and behavior is normal. Judgment and thought content normal. Cognition and memory are normal.  Nursing note and vitals reviewed.   Results for orders placed or performed in visit on 06/06/16  Comprehensive metabolic panel  Result Value Ref Range   Glucose 100 (H) 65 - 99 mg/dL   BUN 26 8 - 27 mg/dL   Creatinine, Ser 1.43 (H) 0.57 - 1.00 mg/dL   GFR calc non Af Amer 34 (L) >59 mL/min/1.73   GFR calc Af Amer 39 (L) >59 mL/min/1.73   BUN/Creatinine Ratio 18 12 - 28   Sodium 142 134 - 144 mmol/L   Potassium 4.7 3.5 - 5.2 mmol/L   Chloride 101 96 - 106 mmol/L   CO2 24 18 - 29 mmol/L   Calcium 9.8 8.7 - 10.3 mg/dL   Total Protein 6.8 6.0 - 8.5 g/dL   Albumin 4.6 3.5 - 4.7 g/dL   Globulin, Total 2.2 1.5 - 4.5 g/dL   Albumin/Globulin Ratio 2.1 1.2 - 2.2   Bilirubin Total 0.4 0.0 - 1.2 mg/dL   Alkaline Phosphatase 87 39 - 117 IU/L   AST 18 0 - 40 IU/L  ALT 14 0 - 32 IU/L  Lipid Panel w/o Chol/HDL Ratio  Result Value Ref Range   Cholesterol, Total 176 100 - 199 mg/dL   Triglycerides 281 (H) 0 - 149 mg/dL   HDL 44 >39 mg/dL   VLDL Cholesterol Cal 56 (H) 5 - 40 mg/dL   LDL Calculated 76 0 - 99 mg/dL  CBC with Differential/Platelet  Result Value Ref Range   WBC 8.2 3.4 - 10.8 x10E3/uL   RBC 3.99 3.77 - 5.28 x10E6/uL   Hemoglobin 12.0 11.1 - 15.9 g/dL   Hematocrit 35.9 34.0 - 46.6 %   MCV 90 79 - 97 fL   MCH 30.1 26.6 - 33.0 pg   MCHC 33.4 31.5 - 35.7 g/dL   RDW 13.2 12.3 - 15.4 %   Platelets 230 150 - 379 x10E3/uL   Neutrophils 58 Not Estab. %   Lymphs 29 Not Estab. %   Monocytes 7 Not Estab. %   Eos 6 Not Estab. %   Basos 0 Not Estab. %   Neutrophils Absolute 4.7 1.4 - 7.0 x10E3/uL   Lymphocytes Absolute  2.4 0.7 - 3.1 x10E3/uL   Monocytes Absolute 0.6 0.1 - 0.9 x10E3/uL   EOS (ABSOLUTE) 0.5 (H) 0.0 - 0.4 x10E3/uL   Basophils Absolute 0.0 0.0 - 0.2 x10E3/uL   Immature Granulocytes 0 Not Estab. %   Immature Grans (Abs) 0.0 0.0 - 0.1 x10E3/uL  TSH  Result Value Ref Range   TSH 1.860 0.450 - 4.500 uIU/mL  UA/M w/rflx Culture, Routine  Result Value Ref Range   Specific Gravity, UA 1.010 1.005 - 1.030   pH, UA 6.0 5.0 - 7.5   Color, UA Yellow Yellow   Appearance Ur Clear Clear   Leukocytes, UA Negative Negative   Protein, UA Negative Negative/Trace   Glucose, UA Negative Negative   Ketones, UA Negative Negative   RBC, UA Negative Negative   Bilirubin, UA Negative Negative   Urobilinogen, Ur 0.2 0.2 - 1.0 mg/dL   Nitrite, UA Negative Negative  Microalbumin, Urine Waived  Result Value Ref Range   Microalb, Ur Waived 10 0 - 19 mg/L   Creatinine, Urine Waived 200 10 - 300 mg/dL   Microalb/Creat Ratio <30 <30 mg/g      Assessment & Plan:   Problem List Items Addressed This Visit    None    Visit Diagnoses    Pneumonia of left lower lobe due to infectious organism ([place])    -  Primary   Resolved.       Follow up plan: Return As scheduled.

## 2016-09-21 DIAGNOSIS — D485 Neoplasm of uncertain behavior of skin: Secondary | ICD-10-CM | POA: Diagnosis not present

## 2016-09-21 DIAGNOSIS — L570 Actinic keratosis: Secondary | ICD-10-CM | POA: Diagnosis not present

## 2016-09-21 DIAGNOSIS — Z85828 Personal history of other malignant neoplasm of skin: Secondary | ICD-10-CM | POA: Diagnosis not present

## 2016-09-21 DIAGNOSIS — D0439 Carcinoma in situ of skin of other parts of face: Secondary | ICD-10-CM | POA: Diagnosis not present

## 2016-09-21 DIAGNOSIS — C44519 Basal cell carcinoma of skin of other part of trunk: Secondary | ICD-10-CM | POA: Diagnosis not present

## 2016-09-21 DIAGNOSIS — X32XXXA Exposure to sunlight, initial encounter: Secondary | ICD-10-CM | POA: Diagnosis not present

## 2016-09-21 DIAGNOSIS — C44311 Basal cell carcinoma of skin of nose: Secondary | ICD-10-CM | POA: Diagnosis not present

## 2016-09-21 DIAGNOSIS — C44722 Squamous cell carcinoma of skin of right lower limb, including hip: Secondary | ICD-10-CM | POA: Diagnosis not present

## 2016-09-29 ENCOUNTER — Ambulatory Visit
Admission: RE | Admit: 2016-09-29 | Discharge: 2016-09-29 | Disposition: A | Discharge status: Home / Self Care | Payer: Medicare Other | Source: Ambulatory Visit | Attending: Family Medicine | Admitting: Family Medicine

## 2016-09-29 ENCOUNTER — Encounter: Payer: Self-pay | Admitting: Family Medicine

## 2016-09-29 DIAGNOSIS — Z1239 Encounter for other screening for malignant neoplasm of breast: Secondary | ICD-10-CM

## 2016-09-29 DIAGNOSIS — Z1231 Encounter for screening mammogram for malignant neoplasm of breast: Secondary | ICD-10-CM | POA: Diagnosis not present

## 2016-10-18 DIAGNOSIS — C4401 Basal cell carcinoma of skin of lip: Secondary | ICD-10-CM | POA: Diagnosis not present

## 2016-10-18 DIAGNOSIS — Z85828 Personal history of other malignant neoplasm of skin: Secondary | ICD-10-CM | POA: Diagnosis not present

## 2016-10-26 DIAGNOSIS — C44519 Basal cell carcinoma of skin of other part of trunk: Secondary | ICD-10-CM | POA: Diagnosis not present

## 2016-10-26 DIAGNOSIS — L905 Scar conditions and fibrosis of skin: Secondary | ICD-10-CM | POA: Diagnosis not present

## 2016-11-09 DIAGNOSIS — C44519 Basal cell carcinoma of skin of other part of trunk: Secondary | ICD-10-CM | POA: Diagnosis not present

## 2016-11-09 DIAGNOSIS — D0439 Carcinoma in situ of skin of other parts of face: Secondary | ICD-10-CM | POA: Diagnosis not present

## 2016-11-09 DIAGNOSIS — D0471 Carcinoma in situ of skin of right lower limb, including hip: Secondary | ICD-10-CM | POA: Diagnosis not present

## 2016-12-06 ENCOUNTER — Ambulatory Visit
Admission: RE | Admit: 2016-12-06 | Discharge: 2016-12-06 | Disposition: A | Discharge status: Home / Self Care | Payer: Medicare Other | Source: Ambulatory Visit | Attending: Family Medicine | Admitting: Family Medicine

## 2016-12-06 ENCOUNTER — Encounter: Payer: Self-pay | Admitting: Family Medicine

## 2016-12-06 ENCOUNTER — Ambulatory Visit (INDEPENDENT_AMBULATORY_CARE_PROVIDER_SITE_OTHER): Payer: Medicare Other | Admitting: Family Medicine

## 2016-12-06 ENCOUNTER — Telehealth: Payer: Self-pay | Admitting: Family Medicine

## 2016-12-06 VITALS — BP 138/68 | HR 66 | Wt 175.2 lb

## 2016-12-06 DIAGNOSIS — I700 Atherosclerosis of aorta: Secondary | ICD-10-CM | POA: Insufficient documentation

## 2016-12-06 DIAGNOSIS — R918 Other nonspecific abnormal finding of lung field: Secondary | ICD-10-CM | POA: Diagnosis not present

## 2016-12-06 DIAGNOSIS — R5383 Other fatigue: Secondary | ICD-10-CM

## 2016-12-06 DIAGNOSIS — Z8701 Personal history of pneumonia (recurrent): Secondary | ICD-10-CM

## 2016-12-06 DIAGNOSIS — E782 Mixed hyperlipidemia: Secondary | ICD-10-CM | POA: Diagnosis not present

## 2016-12-06 DIAGNOSIS — I129 Hypertensive chronic kidney disease with stage 1 through stage 4 chronic kidney disease, or unspecified chronic kidney disease: Secondary | ICD-10-CM | POA: Diagnosis not present

## 2016-12-06 MED ORDER — LOVASTATIN 20 MG PO TABS: 40.0000 mg | ORAL_TABLET | Freq: Every day | ORAL | 3 refills | Status: DC

## 2016-12-06 MED ORDER — LISINOPRIL-HYDROCHLOROTHIAZIDE 20-25 MG PO TABS: 1.0000 | ORAL_TABLET | Freq: Every day | ORAL | 3 refills | Status: DC

## 2016-12-06 NOTE — Assessment & Plan Note (Signed)
Under good control on recheck. Continue current regimen. Continue to monitor. Call with any concerns.  

## 2016-12-06 NOTE — Telephone Encounter (Signed)
Called to let her know that her x-ray came back normal.

## 2016-12-06 NOTE — Progress Notes (Signed)
BP 138/68   Pulse 66   Wt 175 lb 4 oz (79.5 kg)   SpO2 97%   BMI 29.16 kg/m    Subjective:    Patient ID: Faith Cox, female    DOB: 1929/06/02, 83 y.o.   MRN: [id number]  HPI: Faith Cox is a 83 y.o. female  Chief Complaint  Patient presents with  . Hyperlipidemia  . Hypertension   HYPERTENSION / HYPERLIPIDEMIA Satisfied with current treatment? no Duration of hypertension: chronic BP monitoring frequency: not checking BP medication side effects: no Past BP meds: lisinopril-Hctz Duration of hyperlipidemia: chronic Cholesterol medication side effects: no Cholesterol supplements: fish oil Past cholesterol medications: lovastatin Medication compliance: excellent compliance Aspirin: yes Recent stressors: no Recurrent headaches: no Visual changes: no Palpitations: no Dyspnea: no Chest pain: no Lower extremity edema: no Dizzy/lightheaded: no   FATIGUE Duration:  About a month Severity: moderate  Onset: gradual Context when symptoms started:  unknown Symptoms improve with rest: yes  Depressive symptoms: no Stress/anxiety: no Insomnia: no  Snoring: no Observed apnea by bed partner: no Daytime hypersomnolence:no Wakes feeling refreshed: yes History of sleep study: no Dysnea on exertion:  no Orthopnea/PND: no Chest pain: no Chronic cough: no Lower extremity edema: no Arthralgias:no Myalgias: no Weakness: no Rash: no  Relevant past medical, surgical, family and social history reviewed and updated as indicated. Interim medical history since our last visit reviewed. Allergies and medications reviewed and updated.  Review of Systems  Constitutional: Positive for fatigue. Negative for activity change, appetite change, chills, diaphoresis, fever and unexpected weight change.  Respiratory: Negative.   Cardiovascular: Negative.   Psychiatric/Behavioral: Negative.     Per HPI unless specifically indicated above     Objective:    BP 138/68   Pulse  66   Wt 175 lb 4 oz (79.5 kg)   SpO2 97%   BMI 29.16 kg/m   Wt Readings from Last 3 Encounters:  12/06/16 175 lb 4 oz (79.5 kg)  09/12/16 170 lb 14.4 oz (77.5 kg)  08/28/16 171 lb 9.6 oz (77.8 kg)    Physical Exam  Constitutional: She is oriented to person, place, and time. She appears well-developed and well-nourished. No distress.  HENT:  Head: Normocephalic and atraumatic.  Right Ear: Hearing normal.  Left Ear: Hearing normal.  Nose: Nose normal.  Eyes: Conjunctivae and lids are normal. Right eye exhibits no discharge. Left eye exhibits no discharge. No scleral icterus.  Cardiovascular: Normal rate, regular rhythm, normal heart sounds and intact distal pulses.  Exam reveals no gallop and no friction rub.   No murmur heard. Pulmonary/Chest: Effort normal and breath sounds normal. No respiratory distress. She has no wheezes. She has no rales. She exhibits no tenderness.  Musculoskeletal: Normal range of motion.  Neurological: She is alert and oriented to person, place, and time.  Skin: Skin is warm, dry and intact. No rash noted. She is not diaphoretic. No erythema. No pallor.  Psychiatric: She has a normal mood and affect. Her speech is normal and behavior is normal. Judgment and thought content normal. Cognition and memory are normal.  Nursing note and vitals reviewed.   Results for orders placed or performed in visit on 06/06/16  Comprehensive metabolic panel  Result Value Ref Range   Glucose 100 (H) 65 - 99 mg/dL   BUN 26 8 - 27 mg/dL   Creatinine, Ser 1.43 (H) 0.57 - 1.00 mg/dL   GFR calc non Af Amer 34 (L) >59 mL/min/1.73  GFR calc Af Amer 39 (L) >59 mL/min/1.73   BUN/Creatinine Ratio 18 12 - 28   Sodium 142 134 - 144 mmol/L   Potassium 4.7 3.5 - 5.2 mmol/L   Chloride 101 96 - 106 mmol/L   CO2 24 18 - 29 mmol/L   Calcium 9.8 8.7 - 10.3 mg/dL   Total Protein 6.8 6.0 - 8.5 g/dL   Albumin 4.6 3.5 - 4.7 g/dL   Globulin, Total 2.2 1.5 - 4.5 g/dL   Albumin/Globulin  Ratio 2.1 1.2 - 2.2   Bilirubin Total 0.4 0.0 - 1.2 mg/dL   Alkaline Phosphatase 87 39 - 117 IU/L   AST 18 0 - 40 IU/L   ALT 14 0 - 32 IU/L  Lipid Panel w/o Chol/HDL Ratio  Result Value Ref Range   Cholesterol, Total 176 100 - 199 mg/dL   Triglycerides 281 (H) 0 - 149 mg/dL   HDL 44 >39 mg/dL   VLDL Cholesterol Cal 56 (H) 5 - 40 mg/dL   LDL Calculated 76 0 - 99 mg/dL  CBC with Differential/Platelet  Result Value Ref Range   WBC 8.2 3.4 - 10.8 x10E3/uL   RBC 3.99 3.77 - 5.28 x10E6/uL   Hemoglobin 12.0 11.1 - 15.9 g/dL   Hematocrit 35.9 34.0 - 46.6 %   MCV 90 79 - 97 fL   MCH 30.1 26.6 - 33.0 pg   MCHC 33.4 31.5 - 35.7 g/dL   RDW 13.2 12.3 - 15.4 %   Platelets 230 150 - 379 x10E3/uL   Neutrophils 58 Not Estab. %   Lymphs 29 Not Estab. %   Monocytes 7 Not Estab. %   Eos 6 Not Estab. %   Basos 0 Not Estab. %   Neutrophils Absolute 4.7 1.4 - 7.0 x10E3/uL   Lymphocytes Absolute 2.4 0.7 - 3.1 x10E3/uL   Monocytes Absolute 0.6 0.1 - 0.9 x10E3/uL   EOS (ABSOLUTE) 0.5 (H) 0.0 - 0.4 x10E3/uL   Basophils Absolute 0.0 0.0 - 0.2 x10E3/uL   Immature Granulocytes 0 Not Estab. %   Immature Grans (Abs) 0.0 0.0 - 0.1 x10E3/uL  TSH  Result Value Ref Range   TSH 1.860 0.450 - 4.500 uIU/mL  UA/M w/rflx Culture, Routine  Result Value Ref Range   Specific Gravity, UA 1.010 1.005 - 1.030   pH, UA 6.0 5.0 - 7.5   Color, UA Yellow Yellow   Appearance Ur Clear Clear   Leukocytes, UA Negative Negative   Protein, UA Negative Negative/Trace   Glucose, UA Negative Negative   Ketones, UA Negative Negative   RBC, UA Negative Negative   Bilirubin, UA Negative Negative   Urobilinogen, Ur 0.2 0.2 - 1.0 mg/dL   Nitrite, UA Negative Negative  Microalbumin, Urine Waived  Result Value Ref Range   Microalb, Ur Waived 10 0 - 19 mg/L   Creatinine, Urine Waived 200 10 - 300 mg/dL   Microalb/Creat Ratio <30 <30 mg/g      Assessment & Plan:   Problem List Items Addressed This Visit      Genitourinary    Benign hypertensive renal disease - Primary    Under good control on recheck. Continue current regimen. Continue to monitor. Call with any concerns.       Relevant Orders   Comprehensive metabolic panel     Other   Hyperlipidemia    Under good control on recheck. Continue current regimen. Continue to monitor. Call with any concerns.       Relevant Medications   lisinopril-hydrochlorothiazide (  PRINZIDE,ZESTORETIC) 20-25 MG tablet   lovastatin (MEVACOR) 20 MG tablet   Other Relevant Orders   Comprehensive metabolic panel   Lipid Panel w/o Chol/HDL Ratio    Other Visit Diagnoses    Other fatigue       Will check CBC and CXR- await results.    Relevant Orders   DG Chest 2 View   CBC with Differential/Platelet   History of pneumonia       Feeling very fatigued. Will check CXR. Await results.    Relevant Orders   DG Chest 2 View       Follow up plan: Return in about 6 months (around 06/07/2017) for Wellness.

## 2016-12-07 ENCOUNTER — Encounter: Payer: Self-pay | Admitting: Family Medicine

## 2016-12-07 LAB — COMPREHENSIVE METABOLIC PANEL
ALK PHOS: 82 IU/L (ref 39–117)
ALT: 11 IU/L (ref 0–32)
AST: 17 IU/L (ref 0–40)
Albumin/Globulin Ratio: 2 (ref 1.2–2.2)
Albumin: 4.4 g/dL (ref 3.5–4.7)
BUN/Creatinine Ratio: 18 (ref 12–28)
BUN: 24 mg/dL (ref 8–27)
Bilirubin Total: 0.5 mg/dL (ref 0.0–1.2)
CHLORIDE: 103 mmol/L (ref 96–106)
CO2: 22 mmol/L (ref 20–29)
Calcium: 9.7 mg/dL (ref 8.7–10.3)
Creatinine, Ser: 1.36 mg/dL — ABNORMAL HIGH (ref 0.57–1.00)
GFR calc Af Amer: 41 mL/min/{1.73_m2} — ABNORMAL LOW (ref >59)
GFR calc non Af Amer: 36 mL/min/{1.73_m2} — ABNORMAL LOW (ref >59)
GLOBULIN, TOTAL: 2.2 g/dL (ref 1.5–4.5)
Glucose: 94 mg/dL (ref 65–99)
Potassium: 4.7 mmol/L (ref 3.5–5.2)
SODIUM: 140 mmol/L (ref 134–144)
Total Protein: 6.6 g/dL (ref 6.0–8.5)

## 2016-12-07 LAB — CBC WITH DIFFERENTIAL/PLATELET
BASOS ABS: 0 10*3/uL (ref 0.0–0.2)
BASOS: 0 %
EOS (ABSOLUTE): 0.4 10*3/uL (ref 0.0–0.4)
Eos: 6 %
HEMOGLOBIN: 11.8 g/dL (ref 11.1–15.9)
Hematocrit: 35.7 % (ref 34.0–46.6)
IMMATURE GRANS (ABS): 0 10*3/uL (ref 0.0–0.1)
IMMATURE GRANULOCYTES: 0 %
LYMPHS: 26 %
Lymphocytes Absolute: 1.9 10*3/uL (ref 0.7–3.1)
MCH: 30.4 pg (ref 26.6–33.0)
MCHC: 33.1 g/dL (ref 31.5–35.7)
MCV: 92 fL (ref 79–97)
MONOCYTES: 8 %
Monocytes Absolute: 0.6 10*3/uL (ref 0.1–0.9)
NEUTROS ABS: 4.3 10*3/uL (ref 1.4–7.0)
NEUTROS PCT: 60 %
PLATELETS: 230 10*3/uL (ref 150–379)
RBC: 3.88 x10E6/uL (ref 3.77–5.28)
RDW: 13.4 % (ref 12.3–15.4)
WBC: 7.3 10*3/uL (ref 3.4–10.8)

## 2016-12-07 LAB — LIPID PANEL W/O CHOL/HDL RATIO
CHOLESTEROL TOTAL: 175 mg/dL (ref 100–199)
HDL: 47 mg/dL (ref >39)
LDL CALC: 84 mg/dL (ref 0–99)
Triglycerides: 218 mg/dL — ABNORMAL HIGH (ref 0–149)
VLDL Cholesterol Cal: 44 mg/dL — ABNORMAL HIGH (ref 5–40)

## 2016-12-19 DIAGNOSIS — Z23 Encounter for immunization: Secondary | ICD-10-CM | POA: Diagnosis not present

## 2017-02-14 DIAGNOSIS — D2272 Melanocytic nevi of left lower limb, including hip: Secondary | ICD-10-CM | POA: Diagnosis not present

## 2017-02-14 DIAGNOSIS — D2261 Melanocytic nevi of right upper limb, including shoulder: Secondary | ICD-10-CM | POA: Diagnosis not present

## 2017-02-14 DIAGNOSIS — D0439 Carcinoma in situ of skin of other parts of face: Secondary | ICD-10-CM | POA: Diagnosis not present

## 2017-02-14 DIAGNOSIS — D225 Melanocytic nevi of trunk: Secondary | ICD-10-CM | POA: Diagnosis not present

## 2017-02-14 DIAGNOSIS — Z85828 Personal history of other malignant neoplasm of skin: Secondary | ICD-10-CM | POA: Diagnosis not present

## 2017-02-14 DIAGNOSIS — C44519 Basal cell carcinoma of skin of other part of trunk: Secondary | ICD-10-CM | POA: Diagnosis not present

## 2017-04-11 DIAGNOSIS — L905 Scar conditions and fibrosis of skin: Secondary | ICD-10-CM | POA: Diagnosis not present

## 2017-04-11 DIAGNOSIS — C44519 Basal cell carcinoma of skin of other part of trunk: Secondary | ICD-10-CM | POA: Diagnosis not present

## 2017-05-08 ENCOUNTER — Encounter: Payer: Self-pay | Admitting: Family Medicine

## 2017-06-08 ENCOUNTER — Encounter: Payer: Medicare Other | Admitting: Family Medicine

## 2017-06-15 ENCOUNTER — Encounter: Payer: Self-pay | Admitting: Family Medicine

## 2017-06-15 ENCOUNTER — Ambulatory Visit: Payer: Medicare Other | Admitting: Family Medicine

## 2017-06-15 VITALS — BP 144/76 | HR 72 | Temp 98.0°F | Wt 178.2 lb

## 2017-06-15 DIAGNOSIS — I129 Hypertensive chronic kidney disease with stage 1 through stage 4 chronic kidney disease, or unspecified chronic kidney disease: Secondary | ICD-10-CM | POA: Diagnosis not present

## 2017-06-15 DIAGNOSIS — E782 Mixed hyperlipidemia: Secondary | ICD-10-CM | POA: Diagnosis not present

## 2017-06-15 DIAGNOSIS — Z0000 Encounter for general adult medical examination without abnormal findings: Secondary | ICD-10-CM

## 2017-06-15 LAB — UA/M W/RFLX CULTURE, ROUTINE
Bilirubin, UA: NEGATIVE
GLUCOSE, UA: NEGATIVE
KETONES UA: NEGATIVE
LEUKOCYTES UA: NEGATIVE
Nitrite, UA: NEGATIVE
Protein, UA: NEGATIVE
RBC, UA: NEGATIVE
SPEC GRAV UA: 1.015 (ref 1.005–1.030)
UUROB: 0.2 mg/dL (ref 0.2–1.0)
pH, UA: 6 (ref 5.0–7.5)

## 2017-06-15 LAB — MICROALBUMIN, URINE WAIVED
CREATININE, URINE WAIVED: 100 mg/dL (ref 10–300)
Microalb, Ur Waived: 10 mg/L (ref 0–19)

## 2017-06-15 MED ORDER — LISINOPRIL-HYDROCHLOROTHIAZIDE 20-25 MG PO TABS: 1.0000 | ORAL_TABLET | Freq: Every day | ORAL | 1 refills | Status: DC

## 2017-06-15 MED ORDER — SIMVASTATIN 20 MG PO TABS
20.0000 mg | ORAL_TABLET | Freq: Every day | ORAL | 1 refills | Status: DC
Start: 2017-06-15 — End: 2017-12-18

## 2017-06-15 NOTE — Progress Notes (Signed)
BP (!) 144/76 (BP Location: Left Arm, Patient Position: Sitting, Cuff Size: Normal)   Pulse 72   Temp 98 F (36.7 C)   Wt 178 lb 3 oz (80.8 kg)   SpO2 98%   BMI 29.65 kg/m    Subjective:    Patient ID: Faith Cox, female    DOB: [birth year], 84 y.o.   MRN: [id number]  HPI: Faith Cox is a 84 y.o. female presenting on 06/15/2017 for comprehensive medical examination. Current medical complaints include:  HYPERTENSION / HYPERLIPIDEMIA Satisfied with current treatment? yes Duration of hypertension: chronic BP monitoring frequency: not checking BP medication side effects: no Past BP meds: lisinopril-hctz Duration of hyperlipidemia: chronic Cholesterol medication side effects: no- but too expensive, would like to change to simvastatin Cholesterol supplements: none Past cholesterol medications: lovastatin (mevacor) Medication compliance: excellent compliance Aspirin: yes Recent stressors: no Recurrent headaches: no Visual changes: no Palpitations: no Dyspnea: no Chest pain: no Lower extremity edema: no Dizzy/lightheaded: no  Menopausal Symptoms: no  Functional Status Survey: Is the patient deaf or have difficulty hearing?: No Does the patient have difficulty seeing, even when wearing glasses/contacts?: No Does the patient have difficulty concentrating, remembering, or making decisions?: No Does the patient have difficulty walking or climbing stairs?: No Does the patient have difficulty dressing or bathing?: No Does the patient have difficulty doing errands alone such as visiting a doctor's office or shopping?: No  Fall Risk  06/06/2016 06/01/2015  Falls in the past year? No No    Depression Screen Depression screen [facility] 2/9 06/15/2017 06/06/2016 06/01/2015  Decreased Interest 0 0 0  Down, Depressed, Hopeless 0 0 0  PHQ - 2 Score 0 0 0  Altered sleeping 1 - -  Tired, decreased energy 0 - -  Change in appetite 0 - -  Feeling bad or failure about yourself  0 - -    Trouble concentrating 0 - -  Moving slowly or fidgety/restless 0 - -  Suicidal thoughts 0 - -  PHQ-9 Score 1 - -  Difficult doing work/chores Not difficult at all - -    Past Medical History:  Past Medical History:  Diagnosis Date  . Hyperlipidemia   . Hypertension     Surgical History:  Past Surgical History:  Procedure Laterality Date  . ABDOMINAL HYSTERECTOMY    . APPENDECTOMY    . bladder repair    . BREAST EXCISIONAL BIOPSY Left 08/09/1978  . GALLBLADDER SURGERY    . TOE SURGERY      Medications:  Current Outpatient Medications on File Prior to Visit  Medication Sig  . aspirin 81 MG tablet Take 81 mg by mouth daily.  . Calcium Carb-Cholecalciferol [phone] MG-UNIT CAPS Take by mouth.  . IRON PO Take 500 mg by mouth daily.  . Omega-3 Fatty Acids (FISH OIL) 1000 MG CPDR Take by mouth.  . vitamin B-12 (CYANOCOBALAMIN) 1000 MCG tablet Take 1,000 mcg by mouth daily.   No current facility-administered medications on file prior to visit.     Allergies:  No Known Allergies  Social History:  Social History   Socioeconomic History  . Marital status: Married    Spouse name: Not on file  . Number of children: Not on file  . Years of education: Not on file  . Highest education level: Not on file  Social Needs  . Financial resource strain: Not on file  . Food insecurity - worry: Not on file  . Food insecurity - inability: Not on  file  . Transportation needs - medical: Not on file  . Transportation needs - non-medical: Not on file  Occupational History  . Not on file  Tobacco Use  . Smoking status: Former Smoker    Last attempt to quit: 04/11/1979    Years since quitting: 38.2  . Smokeless tobacco: Never Used  . Tobacco comment: quit 30 years ago   Substance and Sexual Activity  . Alcohol use: Yes    Alcohol/week: 0.0 oz    Comment: beer on occasion  . Drug use: No  . Sexual activity: Not Currently  Other Topics Concern  . Not on file  Social History  Narrative  . Not on file   Social History   Tobacco Use  Smoking Status Former Smoker  . Last attempt to quit: 04/11/1979  . Years since quitting: 38.2  Smokeless Tobacco Never Used  Tobacco Comment   quit 30 years ago    Social History   Substance and Sexual Activity  Alcohol Use Yes  . Alcohol/week: 0.0 oz   Comment: beer on occasion    Family History:  Family History  Problem Relation Age of Onset  . Pancreatic cancer Mother   . Stroke Father   . Alzheimer's disease Paternal Grandmother   . Leukemia Brother   . Breast cancer Neg Hx     Past medical history, surgical history, medications, allergies, family history and social history reviewed with patient today and changes made to appropriate areas of the chart.   Review of Systems  Constitutional: Negative.   HENT: Negative.   Eyes: Negative.   Respiratory: Negative.   Cardiovascular: Negative.   Gastrointestinal: Negative.  Negative for abdominal pain, blood in stool, constipation, diarrhea, heartburn, melena, nausea and vomiting.  Genitourinary: Negative.   Musculoskeletal: Negative.   Skin: Negative.   Neurological: Negative.   Endo/Heme/Allergies: Negative.   Psychiatric/Behavioral: Negative.     All other ROS negative except what is listed above and in the HPI.      Objective:    BP (!) 144/76 (BP Location: Left Arm, Patient Position: Sitting, Cuff Size: Normal)   Pulse 72   Temp 98 F (36.7 C)   Wt 178 lb 3 oz (80.8 kg)   SpO2 98%   BMI 29.65 kg/m   Wt Readings from Last 3 Encounters:  06/15/17 178 lb 3 oz (80.8 kg)  12/06/16 175 lb 4 oz (79.5 kg)  09/12/16 170 lb 14.4 oz (77.5 kg)    Physical Exam  Constitutional: She is oriented to person, place, and time. She appears well-developed and well-nourished. No distress.  HENT:  Head: Normocephalic and atraumatic.  Right Ear: Hearing and external ear normal.  Left Ear: Hearing and external ear normal.  Nose: Nose normal.  Mouth/Throat:  Oropharynx is clear and moist. No oropharyngeal exudate.  Eyes: Conjunctivae, EOM and lids are normal. Pupils are equal, round, and reactive to light. Right eye exhibits no discharge. Left eye exhibits no discharge. No scleral icterus.  Neck: Normal range of motion. Neck supple. No JVD present. No tracheal deviation present. No thyromegaly present.  Cardiovascular: Normal rate, regular rhythm, normal heart sounds and intact distal pulses. Exam reveals no gallop and no friction rub.  No murmur heard. Pulmonary/Chest: Effort normal and breath sounds normal. No stridor. No respiratory distress. She has no wheezes. She has no rales. She exhibits no tenderness.  Abdominal: Soft. Bowel sounds are normal. She exhibits no distension and no mass. There is no tenderness. There is no  rebound and no guarding.  Musculoskeletal: Normal range of motion. She exhibits no edema, tenderness or deformity.  Lymphadenopathy:    She has no cervical adenopathy.  Neurological: She is alert and oriented to person, place, and time. She has normal reflexes. She displays normal reflexes. No cranial nerve deficit. She exhibits normal muscle tone. Coordination normal.  Skin: Skin is warm, dry and intact. No rash noted. She is not diaphoretic. No erythema. No pallor.  Psychiatric: She has a normal mood and affect. Her speech is normal and behavior is normal. Judgment and thought content normal. Cognition and memory are normal.  Nursing note and vitals reviewed.   6CIT Screen 06/15/2017 06/06/2016  What Year? 0 points 0 points  What month? 0 points 0 points  What time? 0 points 0 points  Count back from 20 0 points 0 points  Months in reverse 0 points 2 points  Repeat phrase 2 points 2 points  Total Score 2 4    Results for orders placed or performed in visit on 12/06/16  Comprehensive metabolic panel  Result Value Ref Range   Glucose 94 65 - 99 mg/dL   BUN 24 8 - 27 mg/dL   Creatinine, Ser 1.36 (H) 0.57 - 1.00 mg/dL    GFR calc non Af Amer 36 (L) >59 mL/min/1.73   GFR calc Af Amer 41 (L) >59 mL/min/1.73   BUN/Creatinine Ratio 18 12 - 28   Sodium 140 134 - 144 mmol/L   Potassium 4.7 3.5 - 5.2 mmol/L   Chloride 103 96 - 106 mmol/L   CO2 22 20 - 29 mmol/L   Calcium 9.7 8.7 - 10.3 mg/dL   Total Protein 6.6 6.0 - 8.5 g/dL   Albumin 4.4 3.5 - 4.7 g/dL   Globulin, Total 2.2 1.5 - 4.5 g/dL   Albumin/Globulin Ratio 2.0 1.2 - 2.2   Bilirubin Total 0.5 0.0 - 1.2 mg/dL   Alkaline Phosphatase 82 39 - 117 IU/L   AST 17 0 - 40 IU/L   ALT 11 0 - 32 IU/L  Lipid Panel w/o Chol/HDL Ratio  Result Value Ref Range   Cholesterol, Total 175 100 - 199 mg/dL   Triglycerides 218 (H) 0 - 149 mg/dL   HDL 47 >39 mg/dL   VLDL Cholesterol Cal 44 (H) 5 - 40 mg/dL   LDL Calculated 84 0 - 99 mg/dL  CBC with Differential/Platelet  Result Value Ref Range   WBC 7.3 3.4 - 10.8 x10E3/uL   RBC 3.88 3.77 - 5.28 x10E6/uL   Hemoglobin 11.8 11.1 - 15.9 g/dL   Hematocrit 35.7 34.0 - 46.6 %   MCV 92 79 - 97 fL   MCH 30.4 26.6 - 33.0 pg   MCHC 33.1 31.5 - 35.7 g/dL   RDW 13.4 12.3 - 15.4 %   Platelets 230 150 - 379 x10E3/uL   Neutrophils 60 Not Estab. %   Lymphs 26 Not Estab. %   Monocytes 8 Not Estab. %   Eos 6 Not Estab. %   Basos 0 Not Estab. %   Neutrophils Absolute 4.3 1.4 - 7.0 x10E3/uL   Lymphocytes Absolute 1.9 0.7 - 3.1 x10E3/uL   Monocytes Absolute 0.6 0.1 - 0.9 x10E3/uL   EOS (ABSOLUTE) 0.4 0.0 - 0.4 x10E3/uL   Basophils Absolute 0.0 0.0 - 0.2 x10E3/uL   Immature Granulocytes 0 Not Estab. %   Immature Grans (Abs) 0.0 0.0 - 0.1 x10E3/uL      Assessment & Plan:   Problem List Items Addressed  This Visit      Genitourinary   Benign hypertensive renal disease    Stable. Continue to monitor. Call with any concerns. Refills given today.      Relevant Orders   CBC with Differential/Platelet   Comprehensive metabolic panel   Microalbumin, Urine Waived   TSH   UA/M w/rflx Culture, Routine     Other    Hyperlipidemia    Will change to simvastatin due to cost. Rx sent to her pharmacy. Call with any concerns. Continue to monitor. Call with any concerns.       Relevant Medications   simvastatin (ZOCOR) 20 MG tablet   lisinopril-hydrochlorothiazide (PRINZIDE,ZESTORETIC) 20-25 MG tablet   Other Relevant Orders   Comprehensive metabolic panel   Lipid Panel w/o Chol/HDL Ratio    Other Visit Diagnoses    Encounter for Medicare annual wellness exam    -  Primary   Preventative exam done today. Conitnue to monitor. Call with any concerns.    Routine general medical examination at a health care facility       Vaccines up to date. DEXA ordered today. Screening labs checked today. Continue current regimen. Call with any concerns.        Preventative Services:  Health Risk Assessment and Personalized Prevention Plan: Done today Bone Mass Measurements: Ordered today Breast Cancer Screening: N/A CVD Screening: Done today Cervical Cancer Screening: N/A Colon Cancer Screening: N/A Depression Screening: Done today Diabetes Screening: Done today Glaucoma Screening: N/A Hepatitis B vaccine: N/A Hepatitis C screening: N/A HIV Screening: N/A Flu Vaccine: Up to date Lung cancer Screening: N/A Obesity Screening: Done today Pneumonia Vaccines (2): Up to date STI Screening: N/A  Follow up plan: Return in about 6 months (around 12/16/2017) for follow up.   LABORATORY TESTING:  - Pap smear: not applicable  IMMUNIZATIONS:   - Tdap: Tetanus vaccination status reviewed: last tetanus booster within 10 years. - Influenza: Up to date - Pneumovax: Up to date - Prevnar: Up to date - Zostavax vaccine: Not applicable  SCREENING: -Mammogram: Not applicable  - Colonoscopy: Not applicable  - Bone Density: Ordered today   PATIENT COUNSELING:   Advised to take 1 mg of folate supplement per day if capable of pregnancy.   Sexuality: Discussed sexually transmitted diseases, partner selection, use of  condoms, avoidance of unintended pregnancy  and contraceptive alternatives.   Advised to avoid cigarette smoking.  I discussed with the patient that most people either abstain from alcohol or drink within safe limits (<=14/week and <=4 drinks/occasion for males, <=7/weeks and <= 3 drinks/occasion for females) and that the risk for alcohol disorders and other health effects rises proportionally with the number of drinks per week and how often a drinker exceeds daily limits.  Discussed cessation/primary prevention of drug use and availability of treatment for abuse.   Diet: Encouraged to adjust caloric intake to maintain  or achieve ideal body weight, to reduce intake of dietary saturated fat and total fat, to limit sodium intake by avoiding high sodium foods and not adding table salt, and to maintain adequate dietary potassium and calcium preferably from fresh fruits, vegetables, and low-fat dairy products.    stressed the importance of regular exercise  Injury prevention: Discussed safety belts, safety helmets, smoke detector, smoking near bedding or upholstery.   Dental health: Discussed importance of regular tooth brushing, flossing, and dental visits.    NEXT PREVENTATIVE PHYSICAL DUE IN 1 YEAR. Return in about 6 months (around 12/16/2017) for follow up.

## 2017-06-15 NOTE — Assessment & Plan Note (Signed)
Stable. Continue to monitor. Call with any concerns. Refills given today. 

## 2017-06-15 NOTE — Patient Instructions (Addendum)
Health Maintenance for Postmenopausal Women Menopause is a normal process in which your reproductive ability comes to an end. This process happens gradually over a span of months to years, usually between the ages of 22 and 9. Menopause is complete when you have missed 12 consecutive menstrual periods. It is important to talk with your health care provider about some of the most common conditions that affect postmenopausal women, such as heart disease, cancer, and bone loss (osteoporosis). Adopting a healthy lifestyle and getting preventive care can help to promote your health and wellness. Those actions can also lower your chances of developing some of these common conditions. What should I know about menopause? During menopause, you may experience a number of symptoms, such as:  Moderate-to-severe hot flashes.  Night sweats.  Decrease in sex drive.  Mood swings.  Headaches.  Tiredness.  Irritability.  Memory problems.  Insomnia.  Choosing to treat or not to treat menopausal changes is an individual decision that you make with your health care provider. What should I know about hormone replacement therapy and supplements? Hormone therapy products are effective for treating symptoms that are associated with menopause, such as hot flashes and night sweats. Hormone replacement carries certain risks, especially as you become older. If you are thinking about using estrogen or estrogen with progestin treatments, discuss the benefits and risks with your health care provider. What should I know about heart disease and stroke? Heart disease, heart attack, and stroke become more likely as you age. This may be due, in part, to the hormonal changes that your body experiences during menopause. These can affect how your body processes dietary fats, triglycerides, and cholesterol. Heart attack and stroke are both medical emergencies. There are many things that you can do to help prevent heart disease  and stroke:  Have your blood pressure checked at least every 1-2 years. High blood pressure causes heart disease and increases the risk of stroke.  If you are 53-22 years old, ask your health care provider if you should take aspirin to prevent a heart attack or a stroke.  Do not use any tobacco products, including cigarettes, chewing tobacco, or electronic cigarettes. If you need help quitting, ask your health care provider.  It is important to eat a healthy diet and maintain a healthy weight. ? Be sure to include plenty of vegetables, fruits, low-fat dairy products, and lean protein. ? Avoid eating foods that are high in solid fats, added sugars, or salt (sodium).  Get regular exercise. This is one of the most important things that you can do for your health. ? Try to exercise for at least 150 minutes each week. The type of exercise that you do should increase your heart rate and make you sweat. This is known as moderate-intensity exercise. ? Try to do strengthening exercises at least twice each week. Do these in addition to the moderate-intensity exercise.  Know your numbers.Ask your health care provider to check your cholesterol and your blood glucose. Continue to have your blood tested as directed by your health care provider.  What should I know about cancer screening? There are several types of cancer. Take the following steps to reduce your risk and to catch any cancer development as early as possible. Breast Cancer  Practice breast self-awareness. ? This means understanding how your breasts normally appear and feel. ? It also means doing regular breast self-exams. Let your health care provider know about any changes, no matter how small.  If you are 40  or older, have a clinician do a breast exam (clinical breast exam or CBE) every year. Depending on your age, family history, and medical history, it may be recommended that you also have a yearly breast X-ray (mammogram).  If you  have a family history of breast cancer, talk with your health care provider about genetic screening.  If you are at high risk for breast cancer, talk with your health care provider about having an MRI and a mammogram every year.  Breast cancer (BRCA) gene test is recommended for women who have family members with BRCA-related cancers. Results of the assessment will determine the need for genetic counseling and BRCA1 and for BRCA2 testing. BRCA-related cancers include these types: ? Breast. This occurs in males or females. ? Ovarian. ? Tubal. This may also be called fallopian tube cancer. ? Cancer of the abdominal or pelvic lining (peritoneal cancer). ? Prostate. ? Pancreatic.  Cervical, Uterine, and Ovarian Cancer Your health care provider may recommend that you be screened regularly for cancer of the pelvic organs. These include your ovaries, uterus, and vagina. This screening involves a pelvic exam, which includes checking for microscopic changes to the surface of your cervix (Pap test).  For women ages 21-65, health care providers may recommend a pelvic exam and a Pap test every three years. For women ages 76-65, they may recommend the Pap test and pelvic exam, combined with testing for human papilloma virus (HPV), every five years. Some types of HPV increase your risk of cervical cancer. Testing for HPV may also be done on women of any age who have unclear Pap test results.  Other health care providers may not recommend any screening for nonpregnant women who are considered low risk for pelvic cancer and have no symptoms. Ask your health care provider if a screening pelvic exam is right for you.  If you have had past treatment for cervical cancer or a condition that could lead to cancer, you need Pap tests and screening for cancer for at least 20 years after your treatment. If Pap tests have been discontinued for you, your risk factors (such as having a new sexual partner) need to be  reassessed to determine if you should start having screenings again. Some women have medical problems that increase the chance of getting cervical cancer. In these cases, your health care provider may recommend that you have screening and Pap tests more often.  If you have a family history of uterine cancer or ovarian cancer, talk with your health care provider about genetic screening.  If you have vaginal bleeding after reaching menopause, tell your health care provider.  There are currently no reliable tests available to screen for ovarian cancer.  Lung Cancer Lung cancer screening is recommended for adults 70-14 years old who are at high risk for lung cancer because of a history of smoking. A yearly low-dose CT scan of the lungs is recommended if you:  Currently smoke.  Have a history of at least 30 pack-years of smoking and you currently smoke or have quit within the past 15 years. A pack-year is smoking an average of one pack of cigarettes per day for one year.  Yearly screening should:  Continue until it has been 15 years since you quit.  Stop if you develop a health problem that would prevent you from having lung cancer treatment.  Colorectal Cancer  This type of cancer can be detected and can often be prevented.  Routine colorectal cancer screening usually begins at  age 42 and continues through age 45.  If you have risk factors for colon cancer, your health care provider may recommend that you be screened at an earlier age.  If you have a family history of colorectal cancer, talk with your health care provider about genetic screening.  Your health care provider may also recommend using home test kits to check for hidden blood in your stool.  A small camera at the end of a tube can be used to examine your colon directly (sigmoidoscopy or colonoscopy). This is done to check for the earliest forms of colorectal cancer.  Direct examination of the colon should be repeated every  5-10 years until age 71. However, if early forms of precancerous polyps or small growths are found or if you have a family history or genetic risk for colorectal cancer, you may need to be screened more often.  Skin Cancer  Check your skin from head to toe regularly.  Monitor any moles. Be sure to tell your health care provider: ? About any new moles or changes in moles, especially if there is a change in a mole's shape or color. ? If you have a mole that is larger than the size of a pencil eraser.  If any of your family members has a history of skin cancer, especially at a young age, talk with your health care provider about genetic screening.  Always use sunscreen. Apply sunscreen liberally and repeatedly throughout the day.  Whenever you are outside, protect yourself by wearing long sleeves, pants, a wide-brimmed hat, and sunglasses.  What should I know about osteoporosis? Osteoporosis is a condition in which bone destruction happens more quickly than new bone creation. After menopause, you may be at an increased risk for osteoporosis. To help prevent osteoporosis or the bone fractures that can happen because of osteoporosis, the following is recommended:  If you are 46-71 years old, get at least 1,000 mg of calcium and at least 600 mg of vitamin D per day.  If you are older than age 55 but younger than age 65, get at least 1,200 mg of calcium and at least 600 mg of vitamin D per day.  If you are older than age 54, get at least 1,200 mg of calcium and at least 800 mg of vitamin D per day.  Smoking and excessive alcohol intake increase the risk of osteoporosis. Eat foods that are rich in calcium and vitamin D, and do weight-bearing exercises several times each week as directed by your health care provider. What should I know about how menopause affects my mental health? Depression may occur at any age, but it is more common as you become older. Common symptoms of depression  include:  Low or sad mood.  Changes in sleep patterns.  Changes in appetite or eating patterns.  Feeling an overall lack of motivation or enjoyment of activities that you previously enjoyed.  Frequent crying spells.  Talk with your health care provider if you think that you are experiencing depression. What should I know about immunizations? It is important that you get and maintain your immunizations. These include:  Tetanus, diphtheria, and pertussis (Tdap) booster vaccine.  Influenza every year before the flu season begins.  Pneumonia vaccine.  Shingles vaccine.  Your health care provider may also recommend other immunizations. This information is not intended to replace advice given to you by your health care provider. Make sure you discuss any questions you have with your health care provider. Document Released: 05/19/2005  Document Revised: 10/15/2015 Document Reviewed: 12/29/2014 Elsevier Interactive Patient Education  2018 [employer]  

## 2017-06-15 NOTE — Assessment & Plan Note (Signed)
Will change to simvastatin due to cost. Rx sent to her pharmacy. Call with any concerns. Continue to monitor. Call with any concerns.

## 2017-06-16 LAB — COMPREHENSIVE METABOLIC PANEL
ALT: 17 IU/L (ref 0–32)
AST: 19 IU/L (ref 0–40)
Albumin/Globulin Ratio: 2.1 (ref 1.2–2.2)
Albumin: 4.4 g/dL (ref 3.5–4.7)
Alkaline Phosphatase: 81 IU/L (ref 39–117)
BUN/Creatinine Ratio: 21 (ref 12–28)
BUN: 27 mg/dL (ref 8–27)
Bilirubin Total: 0.5 mg/dL (ref 0.0–1.2)
CHLORIDE: 107 mmol/L — AB (ref 96–106)
CO2: 24 mmol/L (ref 20–29)
CREATININE: 1.31 mg/dL — AB (ref 0.57–1.00)
Calcium: 9.8 mg/dL (ref 8.7–10.3)
GFR, EST AFRICAN AMERICAN: 43 mL/min/{1.73_m2} — AB (ref >59)
GFR, EST NON AFRICAN AMERICAN: 37 mL/min/{1.73_m2} — AB (ref >59)
GLUCOSE: 91 mg/dL (ref 65–99)
Globulin, Total: 2.1 g/dL (ref 1.5–4.5)
Potassium: 4.8 mmol/L (ref 3.5–5.2)
Sodium: 145 mmol/L — ABNORMAL HIGH (ref 134–144)
TOTAL PROTEIN: 6.5 g/dL (ref 6.0–8.5)

## 2017-06-16 LAB — CBC WITH DIFFERENTIAL/PLATELET
Basophils Absolute: 0 10*3/uL (ref 0.0–0.2)
Basos: 0 %
EOS (ABSOLUTE): 0.4 10*3/uL (ref 0.0–0.4)
Eos: 6 %
Hematocrit: 34.6 % (ref 34.0–46.6)
Hemoglobin: 11.6 g/dL (ref 11.1–15.9)
IMMATURE GRANS (ABS): 0 10*3/uL (ref 0.0–0.1)
Immature Granulocytes: 0 %
LYMPHS: 24 %
Lymphocytes Absolute: 1.7 10*3/uL (ref 0.7–3.1)
MCH: 30.5 pg (ref 26.6–33.0)
MCHC: 33.5 g/dL (ref 31.5–35.7)
MCV: 91 fL (ref 79–97)
MONOCYTES: 9 %
Monocytes Absolute: 0.6 10*3/uL (ref 0.1–0.9)
Neutrophils Absolute: 4.2 10*3/uL (ref 1.4–7.0)
Neutrophils: 61 %
PLATELETS: 239 10*3/uL (ref 150–379)
RBC: 3.8 x10E6/uL (ref 3.77–5.28)
RDW: 13.2 % (ref 12.3–15.4)
WBC: 6.9 10*3/uL (ref 3.4–10.8)

## 2017-06-16 LAB — LIPID PANEL W/O CHOL/HDL RATIO
Cholesterol, Total: 151 mg/dL (ref 100–199)
HDL: 47 mg/dL (ref >39)
LDL CALC: 75 mg/dL (ref 0–99)
Triglycerides: 144 mg/dL (ref 0–149)
VLDL CHOLESTEROL CAL: 29 mg/dL (ref 5–40)

## 2017-06-16 LAB — TSH: TSH: 1.58 u[IU]/mL (ref 0.450–4.500)

## 2017-06-18 ENCOUNTER — Encounter: Payer: Self-pay | Admitting: Family Medicine

## 2017-07-23 DIAGNOSIS — Z85828 Personal history of other malignant neoplasm of skin: Secondary | ICD-10-CM | POA: Diagnosis not present

## 2017-07-23 DIAGNOSIS — C44519 Basal cell carcinoma of skin of other part of trunk: Secondary | ICD-10-CM | POA: Diagnosis not present

## 2017-07-23 DIAGNOSIS — Z08 Encounter for follow-up examination after completed treatment for malignant neoplasm: Secondary | ICD-10-CM | POA: Diagnosis not present

## 2017-07-23 DIAGNOSIS — D485 Neoplasm of uncertain behavior of skin: Secondary | ICD-10-CM | POA: Diagnosis not present

## 2017-07-23 DIAGNOSIS — C4401 Basal cell carcinoma of skin of lip: Secondary | ICD-10-CM | POA: Diagnosis not present

## 2017-07-23 DIAGNOSIS — L821 Other seborrheic keratosis: Secondary | ICD-10-CM | POA: Diagnosis not present

## 2017-07-23 DIAGNOSIS — D0439 Carcinoma in situ of skin of other parts of face: Secondary | ICD-10-CM | POA: Diagnosis not present

## 2017-07-23 DIAGNOSIS — C44719 Basal cell carcinoma of skin of left lower limb, including hip: Secondary | ICD-10-CM | POA: Diagnosis not present

## 2017-08-16 DIAGNOSIS — C44321 Squamous cell carcinoma of skin of nose: Secondary | ICD-10-CM | POA: Diagnosis not present

## 2017-08-16 DIAGNOSIS — Z85828 Personal history of other malignant neoplasm of skin: Secondary | ICD-10-CM | POA: Diagnosis not present

## 2017-08-16 DIAGNOSIS — C4401 Basal cell carcinoma of skin of lip: Secondary | ICD-10-CM | POA: Diagnosis not present

## 2017-08-21 ENCOUNTER — Other Ambulatory Visit: Payer: Self-pay | Admitting: Family Medicine

## 2017-08-21 DIAGNOSIS — Z1231 Encounter for screening mammogram for malignant neoplasm of breast: Secondary | ICD-10-CM

## 2017-09-11 DIAGNOSIS — C44519 Basal cell carcinoma of skin of other part of trunk: Secondary | ICD-10-CM | POA: Diagnosis not present

## 2017-09-24 DIAGNOSIS — C44719 Basal cell carcinoma of skin of left lower limb, including hip: Secondary | ICD-10-CM | POA: Diagnosis not present

## 2017-10-01 ENCOUNTER — Ambulatory Visit
Admission: RE | Admit: 2017-10-01 | Discharge: 2017-10-01 | Disposition: A | Discharge status: Home / Self Care | Payer: Medicare Other | Source: Ambulatory Visit | Attending: Family Medicine | Admitting: Family Medicine

## 2017-10-01 ENCOUNTER — Encounter: Payer: Self-pay | Admitting: Family Medicine

## 2017-10-01 DIAGNOSIS — Z1231 Encounter for screening mammogram for malignant neoplasm of breast: Secondary | ICD-10-CM | POA: Insufficient documentation

## 2017-12-18 ENCOUNTER — Ambulatory Visit (INDEPENDENT_AMBULATORY_CARE_PROVIDER_SITE_OTHER): Payer: Medicare Other | Admitting: Family Medicine

## 2017-12-18 ENCOUNTER — Encounter: Payer: Self-pay | Admitting: Family Medicine

## 2017-12-18 VITALS — BP 138/64 | HR 65 | Temp 97.8°F | Wt 182.2 lb

## 2017-12-18 DIAGNOSIS — I129 Hypertensive chronic kidney disease with stage 1 through stage 4 chronic kidney disease, or unspecified chronic kidney disease: Secondary | ICD-10-CM | POA: Diagnosis not present

## 2017-12-18 DIAGNOSIS — E782 Mixed hyperlipidemia: Secondary | ICD-10-CM | POA: Diagnosis not present

## 2017-12-18 DIAGNOSIS — Z23 Encounter for immunization: Secondary | ICD-10-CM

## 2017-12-18 MED ORDER — SIMVASTATIN 20 MG PO TABS: 20.0000 mg | ORAL_TABLET | Freq: Every day | ORAL | 1 refills | Status: DC

## 2017-12-18 MED ORDER — LISINOPRIL-HYDROCHLOROTHIAZIDE 20-25 MG PO TABS: 1.0000 | ORAL_TABLET | Freq: Every day | ORAL | 1 refills | Status: DC

## 2017-12-18 NOTE — Patient Instructions (Signed)

## 2017-12-18 NOTE — Assessment & Plan Note (Signed)
Under good control on current regimen. Continue current regimen. Continue to monitor. Call with any concerns. Refills given.   

## 2017-12-18 NOTE — Progress Notes (Signed)
BP 138/64 (BP Location: Left Arm, Cuff Size: Normal)   Pulse 65   Temp 97.8 F (36.6 C)   Wt 182 lb 3 oz (82.6 kg)   SpO2 97%   BMI 30.32 kg/m    Subjective:    Patient ID: Faith Cox, female    DOB: 03-May-1929, 84 y.o.   MRN: [id number]  HPI: Faith Cox is a 84 y.o. female  Chief Complaint  Patient presents with  . Hyperlipidemia  . Hypertension   HYPERTENSION / [place] Satisfied with current treatment? yes Duration of hypertension: chronic BP monitoring frequency: not checking BP medication side effects: no Past BP meds: lisinopril-hctz Duration of hyperlipidemia: chronic Cholesterol medication side effects: no Cholesterol supplements: fish oil Past cholesterol medications: simvastatin (zocor) Medication compliance: excellent compliance Aspirin: yes Recent stressors: no Recurrent headaches: no Visual changes: no Palpitations: no Dyspnea: no Chest pain: no Lower extremity edema: no Dizzy/lightheaded: no  Relevant past medical, surgical, family and social history reviewed and updated as indicated. Interim medical history since our last visit reviewed. Allergies and medications reviewed and updated.  Review of Systems  Constitutional: Positive for fatigue. Negative for activity change, appetite change, chills, diaphoresis, fever and unexpected weight change.  Respiratory: Negative.   Cardiovascular: Negative.   Psychiatric/Behavioral: Negative.     Per HPI unless specifically indicated above     Objective:    BP 138/64 (BP Location: Left Arm, Cuff Size: Normal)   Pulse 65   Temp 97.8 F (36.6 C)   Wt 182 lb 3 oz (82.6 kg)   SpO2 97%   BMI 30.32 kg/m   Wt Readings from Last 3 Encounters:  12/18/17 182 lb 3 oz (82.6 kg)  06/15/17 178 lb 3 oz (80.8 kg)  12/06/16 175 lb 4 oz (79.5 kg)    Physical Exam  Constitutional: She is oriented to person, place, and time. She appears well-developed and well-nourished. No distress.  HENT:  Head:  Normocephalic and atraumatic.  Right Ear: Hearing normal.  Left Ear: Hearing normal.  Nose: Nose normal.  Eyes: Conjunctivae and lids are normal. Right eye exhibits no discharge. Left eye exhibits no discharge. No scleral icterus.  Cardiovascular: Normal rate, regular rhythm, normal heart sounds and intact distal pulses. Exam reveals no gallop and no friction rub.  No murmur heard. Pulmonary/Chest: Effort normal and breath sounds normal. No stridor. No respiratory distress. She has no wheezes. She has no rales. She exhibits no tenderness.  Musculoskeletal: Normal range of motion.  Neurological: She is alert and oriented to person, place, and time.  Skin: Skin is warm, dry and intact. Capillary refill takes less than 2 seconds. No rash noted. She is not diaphoretic. No erythema. No pallor.  Psychiatric: She has a normal mood and affect. Her speech is normal and behavior is normal. Judgment and thought content normal. Cognition and memory are normal.  Nursing note and vitals reviewed.   Results for orders placed or performed in visit on 06/15/17  CBC with Differential/Platelet  Result Value Ref Range   WBC 6.9 3.4 - 10.8 x10E3/uL   RBC 3.80 3.77 - 5.28 x10E6/uL   Hemoglobin 11.6 11.1 - 15.9 g/dL   Hematocrit 34.6 34.0 - 46.6 %   MCV 91 79 - 97 fL   MCH 30.5 26.6 - 33.0 pg   MCHC 33.5 31.5 - 35.7 g/dL   RDW 13.2 12.3 - 15.4 %   Platelets 239 150 - 379 x10E3/uL   Neutrophils 61 Not Estab. %  Lymphs 24 Not Estab. %   Monocytes 9 Not Estab. %   Eos 6 Not Estab. %   Basos 0 Not Estab. %   Neutrophils Absolute 4.2 1.4 - 7.0 x10E3/uL   Lymphocytes Absolute 1.7 0.7 - 3.1 x10E3/uL   Monocytes Absolute 0.6 0.1 - 0.9 x10E3/uL   EOS (ABSOLUTE) 0.4 0.0 - 0.4 x10E3/uL   Basophils Absolute 0.0 0.0 - 0.2 x10E3/uL   Immature Granulocytes 0 Not Estab. %   Immature Grans (Abs) 0.0 0.0 - 0.1 x10E3/uL  Comprehensive metabolic panel  Result Value Ref Range   Glucose 91 65 - 99 mg/dL   BUN 27 8 -  27 mg/dL   Creatinine, Ser 1.31 (H) 0.57 - 1.00 mg/dL   GFR calc non Af Amer 37 (L) >59 mL/min/1.73   GFR calc Af Amer 43 (L) >59 mL/min/1.73   BUN/Creatinine Ratio 21 12 - 28   Sodium 145 (H) 134 - 144 mmol/L   Potassium 4.8 3.5 - 5.2 mmol/L   Chloride 107 (H) 96 - 106 mmol/L   CO2 24 20 - 29 mmol/L   Calcium 9.8 8.7 - 10.3 mg/dL   Total Protein 6.5 6.0 - 8.5 g/dL   Albumin 4.4 3.5 - 4.7 g/dL   Globulin, Total 2.1 1.5 - 4.5 g/dL   Albumin/Globulin Ratio 2.1 1.2 - 2.2   Bilirubin Total 0.5 0.0 - 1.2 mg/dL   Alkaline Phosphatase 81 39 - 117 IU/L   AST 19 0 - 40 IU/L   ALT 17 0 - 32 IU/L  Lipid Panel w/o Chol/HDL Ratio  Result Value Ref Range   Cholesterol, Total 151 100 - 199 mg/dL   Triglycerides 144 0 - 149 mg/dL   HDL 47 >39 mg/dL   VLDL Cholesterol Cal 29 5 - 40 mg/dL   LDL Calculated 75 0 - 99 mg/dL  Microalbumin, Urine Waived  Result Value Ref Range   Microalb, Ur Waived 10 0 - 19 mg/L   Creatinine, Urine Waived 100 10 - 300 mg/dL   Microalb/Creat Ratio <30 <30 mg/g  TSH  Result Value Ref Range   TSH 1.580 0.450 - 4.500 uIU/mL  UA/M w/rflx Culture, Routine  Result Value Ref Range   Specific Gravity, UA 1.015 1.005 - 1.030   pH, UA 6.0 5.0 - 7.5   Color, UA Yellow Yellow   Appearance Ur Clear Clear   Leukocytes, UA Negative Negative   Protein, UA Negative Negative/Trace   Glucose, UA Negative Negative   Ketones, UA Negative Negative   RBC, UA Negative Negative   Bilirubin, UA Negative Negative   Urobilinogen, Ur 0.2 0.2 - 1.0 mg/dL   Nitrite, UA Negative Negative      Assessment & Plan:   Problem List Items Addressed This Visit      Genitourinary   Benign hypertensive renal disease    Under good control on current regimen. Continue current regimen. Continue to monitor. Call with any concerns. Refills given.        Relevant Orders   Comprehensive metabolic panel   Lipid Panel w/o Chol/HDL Ratio     Other   Hyperlipidemia - Primary    Under good  control on current regimen. Continue current regimen. Continue to monitor. Call with any concerns. Refills given.        Relevant Medications   lisinopril-hydrochlorothiazide (PRINZIDE,ZESTORETIC) 20-25 MG tablet   simvastatin (ZOCOR) 20 MG tablet   Other Relevant Orders   Comprehensive metabolic panel    Other Visit Diagnoses  Immunization due       Flu shot given today.   Relevant Orders   Flu vaccine HIGH DOSE PF (Fluzone High dose) (Completed)       Follow up plan: Return in about 6 months (around 06/18/2018) for Physical/wellness.

## 2017-12-19 ENCOUNTER — Encounter: Payer: Self-pay | Admitting: Family Medicine

## 2017-12-19 LAB — LIPID PANEL W/O CHOL/HDL RATIO
CHOLESTEROL TOTAL: 148 mg/dL (ref 100–199)
HDL: 37 mg/dL — ABNORMAL LOW (ref >39)
LDL CALC: 60 mg/dL (ref 0–99)
TRIGLYCERIDES: 256 mg/dL — AB (ref 0–149)
VLDL CHOLESTEROL CAL: 51 mg/dL — AB (ref 5–40)

## 2017-12-19 LAB — COMPREHENSIVE METABOLIC PANEL
ALK PHOS: 74 IU/L (ref 39–117)
ALT: 17 IU/L (ref 0–32)
AST: 19 IU/L (ref 0–40)
Albumin/Globulin Ratio: 2 (ref 1.2–2.2)
Albumin: 4.4 g/dL (ref 3.5–4.7)
BILIRUBIN TOTAL: 0.5 mg/dL (ref 0.0–1.2)
BUN / CREAT RATIO: 15 (ref 12–28)
BUN: 22 mg/dL (ref 8–27)
CHLORIDE: 109 mmol/L — AB (ref 96–106)
CO2: 20 mmol/L (ref 20–29)
CREATININE: 1.42 mg/dL — AB (ref 0.57–1.00)
Calcium: 9.7 mg/dL (ref 8.7–10.3)
GFR calc Af Amer: 39 mL/min/{1.73_m2} — ABNORMAL LOW (ref >59)
GFR calc non Af Amer: 33 mL/min/{1.73_m2} — ABNORMAL LOW (ref >59)
GLUCOSE: 100 mg/dL — AB (ref 65–99)
Globulin, Total: 2.2 g/dL (ref 1.5–4.5)
Potassium: 4.6 mmol/L (ref 3.5–5.2)
SODIUM: 146 mmol/L — AB (ref 134–144)
Total Protein: 6.6 g/dL (ref 6.0–8.5)

## 2017-12-25 DIAGNOSIS — Z08 Encounter for follow-up examination after completed treatment for malignant neoplasm: Secondary | ICD-10-CM | POA: Diagnosis not present

## 2017-12-25 DIAGNOSIS — D485 Neoplasm of uncertain behavior of skin: Secondary | ICD-10-CM | POA: Diagnosis not present

## 2017-12-25 DIAGNOSIS — Z85828 Personal history of other malignant neoplasm of skin: Secondary | ICD-10-CM | POA: Diagnosis not present

## 2017-12-25 DIAGNOSIS — C44319 Basal cell carcinoma of skin of other parts of face: Secondary | ICD-10-CM | POA: Diagnosis not present

## 2017-12-25 DIAGNOSIS — L570 Actinic keratosis: Secondary | ICD-10-CM | POA: Diagnosis not present

## 2018-02-06 DIAGNOSIS — Z85828 Personal history of other malignant neoplasm of skin: Secondary | ICD-10-CM | POA: Diagnosis not present

## 2018-02-06 DIAGNOSIS — C44319 Basal cell carcinoma of skin of other parts of face: Secondary | ICD-10-CM | POA: Diagnosis not present

## 2018-05-13 DIAGNOSIS — Z08 Encounter for follow-up examination after completed treatment for malignant neoplasm: Secondary | ICD-10-CM | POA: Diagnosis not present

## 2018-05-13 DIAGNOSIS — Z85828 Personal history of other malignant neoplasm of skin: Secondary | ICD-10-CM | POA: Diagnosis not present

## 2018-05-13 DIAGNOSIS — C44519 Basal cell carcinoma of skin of other part of trunk: Secondary | ICD-10-CM | POA: Diagnosis not present

## 2018-05-13 DIAGNOSIS — L300 Nummular dermatitis: Secondary | ICD-10-CM | POA: Diagnosis not present

## 2018-05-13 DIAGNOSIS — D485 Neoplasm of uncertain behavior of skin: Secondary | ICD-10-CM | POA: Diagnosis not present

## 2018-05-13 DIAGNOSIS — C44319 Basal cell carcinoma of skin of other parts of face: Secondary | ICD-10-CM | POA: Diagnosis not present

## 2018-06-05 DIAGNOSIS — D0439 Carcinoma in situ of skin of other parts of face: Secondary | ICD-10-CM | POA: Diagnosis not present

## 2018-06-05 DIAGNOSIS — C44319 Basal cell carcinoma of skin of other parts of face: Secondary | ICD-10-CM | POA: Diagnosis not present

## 2018-06-12 DIAGNOSIS — C44519 Basal cell carcinoma of skin of other part of trunk: Secondary | ICD-10-CM | POA: Diagnosis not present

## 2018-06-18 ENCOUNTER — Encounter: Payer: Self-pay | Admitting: Family Medicine

## 2018-06-18 ENCOUNTER — Ambulatory Visit (INDEPENDENT_AMBULATORY_CARE_PROVIDER_SITE_OTHER): Payer: Medicare Other | Admitting: Family Medicine

## 2018-06-18 VITALS — BP 132/76 | HR 75 | Temp 98.4°F | Ht 63.78 in | Wt 175.0 lb

## 2018-06-18 DIAGNOSIS — Z0000 Encounter for general adult medical examination without abnormal findings: Secondary | ICD-10-CM

## 2018-06-18 DIAGNOSIS — I129 Hypertensive chronic kidney disease with stage 1 through stage 4 chronic kidney disease, or unspecified chronic kidney disease: Secondary | ICD-10-CM | POA: Diagnosis not present

## 2018-06-18 DIAGNOSIS — Z6830 Body mass index (BMI) 30.0-30.9, adult: Secondary | ICD-10-CM

## 2018-06-18 DIAGNOSIS — D692 Other nonthrombocytopenic purpura: Secondary | ICD-10-CM | POA: Insufficient documentation

## 2018-06-18 DIAGNOSIS — Z7189 Other specified counseling: Secondary | ICD-10-CM | POA: Insufficient documentation

## 2018-06-18 DIAGNOSIS — Z780 Asymptomatic menopausal state: Secondary | ICD-10-CM

## 2018-06-18 DIAGNOSIS — Z1239 Encounter for other screening for malignant neoplasm of breast: Secondary | ICD-10-CM

## 2018-06-18 DIAGNOSIS — E6609 Other obesity due to excess calories: Secondary | ICD-10-CM | POA: Insufficient documentation

## 2018-06-18 DIAGNOSIS — E782 Mixed hyperlipidemia: Secondary | ICD-10-CM

## 2018-06-18 MED ORDER — SIMVASTATIN 20 MG PO TABS: 20.0000 mg | ORAL_TABLET | Freq: Every day | ORAL | 1 refills | Status: DC

## 2018-06-18 MED ORDER — LISINOPRIL-HYDROCHLOROTHIAZIDE 20-25 MG PO TABS: 1.0000 | ORAL_TABLET | Freq: Every day | ORAL | 1 refills | Status: DC

## 2018-06-18 NOTE — Assessment & Plan Note (Signed)
A voluntary discussion about advance care planning including the explanation and discussion of advance directives was extensively discussed  with the patient for 7 minutes with patient present.  Explanation about the health care proxy and Living will was reviewed and packet with forms with explanation of how to fill them out was given.  During this discussion, the patient was able to identify a health care proxy as her daughter and plans to fill out the paperwork required.  Patient was offered a separate [place] visit for further assistance with forms.

## 2018-06-18 NOTE — Patient Instructions (Addendum)
Call to make mammogram and bone density appointment: [facility] at [facility]  Address: [address], [place], [place] [address]  Phone: [phone]   Preventative Services:  Health Risk Assessment and Personalized Prevention Plan: Done today Bone Mass Measurements: Ordered today Breast Cancer Screening: Ordered today CVD Screening: Done today Cervical Cancer Screening: N/A Colon Cancer Screening: N/A Depression Screening: Done today Diabetes Screening: Done today Glaucoma Screening: See your eye doctor Hepatitis B vaccine: N/A Hepatitis C screening: N/A HIV Screening: N/A  Flu Vaccine: Up to date Lung cancer Screening: N/A Obesity Screening: Done today Pneumonia Vaccines (2): Up to date STI Screening: N/A   Health Maintenance After Age 67 After age 91, you are at a higher risk for certain long-term diseases and infections as well as injuries from falls. Falls are a major cause of broken bones and head injuries in people who are older than age 41. Getting regular preventive care can help to keep you healthy and well. Preventive care includes getting regular testing and making lifestyle changes as recommended by your health care provider. Talk with your health care provider about:  Which screenings and tests you should have. A screening is a test that checks for a disease when you have no symptoms.  A diet and exercise plan that is right for you. What should I know about screenings and tests to prevent falls? Screening and testing are the best ways to find a health problem early. Early diagnosis and treatment give you the best chance of managing medical conditions that are common after age 2. Certain conditions and lifestyle choices may make you more likely to have a fall. Your health care provider may recommend:  Regular vision checks. Poor vision and conditions such as cataracts can make you more likely to have a fall. If you wear glasses, make sure  to get your prescription updated if your vision changes.  Medicine review. Work with your health care provider to regularly review all of the medicines you are taking, including over-the-counter medicines. Ask your health care provider about any side effects that may make you more likely to have a fall. Tell your health care provider if any medicines that you take make you feel dizzy or sleepy.  Osteoporosis screening. Osteoporosis is a condition that causes the bones to get weaker. This can make the bones weak and cause them to break more easily.  Blood pressure screening. Blood pressure changes and medicines to control blood pressure can make you feel dizzy.  Strength and balance checks. Your health care provider may recommend certain tests to check your strength and balance while standing, walking, or changing positions.  Foot health exam. Foot pain and numbness, as well as not wearing proper footwear, can make you more likely to have a fall.  Depression screening. You may be more likely to have a fall if you have a fear of falling, feel emotionally low, or feel unable to do activities that you used to do.  Alcohol use screening. Using too much alcohol can affect your balance and may make you more likely to have a fall. What actions can I take to lower my risk of falls? General instructions  Talk with your health care provider about your risks for falling. Tell your health care provider if: ? You fall. Be sure to tell your health care provider about all falls, even ones that seem minor. ? You feel dizzy, sleepy, or off-balance.  Take over-the-counter and prescription medicines only as  told by your health care provider. These include any supplements.  Eat a healthy diet and maintain a healthy weight. A healthy diet includes low-fat dairy products, low-fat (lean) meats, and fiber from whole grains, beans, and lots of fruits and vegetables. Home safety  Remove any tripping hazards, such as  rugs, cords, and clutter.  Install safety equipment such as grab bars in bathrooms and safety rails on stairs.  Keep rooms and walkways well-lit. Activity   Follow a regular exercise program to stay fit. This will help you maintain your balance. Ask your health care provider what types of exercise are appropriate for you.  If you need a cane or walker, use it as recommended by your health care provider.  Wear supportive shoes that have nonskid soles. Lifestyle  Do not drink alcohol if your health care provider tells you not to drink.  If you drink alcohol, limit how much you have: ? 0-1 drink a day for women. ? 0-2 drinks a day for men.  Be aware of how much alcohol is in your drink. In the [place] one drink equals one typical bottle of beer (12 oz), one-half glass of wine (5 oz), or one shot of hard liquor (1 oz).  Do not use any products that contain nicotine or tobacco, such as cigarettes and e-cigarettes. If you need help quitting, ask your health care provider. Summary  Having a healthy lifestyle and getting preventive care can help to protect your health and wellness after age 37.  Screening and testing are the best way to find a health problem early and help you avoid having a fall. Early diagnosis and treatment give you the best chance for managing medical conditions that are more common for people who are older than age 33.  Falls are a major cause of broken bones and head injuries in people who are older than age 50. Take precautions to prevent a fall at home.  Work with your health care provider to learn what changes you can make to improve your health and wellness and to prevent falls. This information is not intended to replace advice given to you by your health care provider. Make sure you discuss any questions you have with your health care provider. Document Released: 02/07/2017 Document Revised: 02/07/2017 Document Reviewed: 02/07/2017 Elsevier Interactive Patient  Education  2019 [employer].

## 2018-06-18 NOTE — Assessment & Plan Note (Signed)
Reassured patient. Call with any concerns.  

## 2018-06-18 NOTE — Assessment & Plan Note (Signed)
Under good control on current regimen. Continue current regimen. Continue to monitor. Call with any concerns. Refills given. Labs drawn today.   

## 2018-06-18 NOTE — Assessment & Plan Note (Signed)
Stable. Continue to monitor. Call with any concerns. No weight loss recommended.

## 2018-06-18 NOTE — Progress Notes (Signed)
BP 132/76   Pulse 75   Temp 98.4 F (36.9 C) (Oral)   Ht 5' 3.78" (1.62 m)   Wt 175 lb (79.4 kg)   SpO2 96%   BMI 30.25 kg/m    Subjective:    Patient ID: Faith Cox, female    DOB: 10-May-1929, 85 y.o.   MRN: [id number]  HPI: Faith Cox is a 85 y.o. female presenting on 06/18/2018 for comprehensive medical examination. Current medical complaints include:  HYPERTENSION / HYPERLIPIDEMIA Satisfied with current treatment? yes Duration of hypertension: chronic BP monitoring frequency: not checking BP medication side effects: no Past BP meds: lisinopril- hctz Duration of hyperlipidemia: chronic Cholesterol medication side effects: no Cholesterol supplements: fish oil Past cholesterol medications: simvastatin Medication compliance: excellent compliance Aspirin: yes Recent stressors: no Recurrent headaches: no Visual changes: no Palpitations: no Dyspnea: no Chest pain: no Lower extremity edema: no Dizzy/lightheaded: no  Menopausal Symptoms: no  Functional Status Survey: Is the patient deaf or have difficulty hearing?: Yes Does the patient have difficulty seeing, even when wearing glasses/contacts?: No Does the patient have difficulty concentrating, remembering, or making decisions?: No Does the patient have difficulty walking or climbing stairs?: No Does the patient have difficulty dressing or bathing?: No Does the patient have difficulty doing errands alone such as visiting a doctor's office or shopping?: No  Fall Risk  06/18/2018 06/18/2018 06/06/2016 06/01/2015  Falls in the past year? 0 0 No No  Number falls in past yr: 0 - - -  Injury with Fall? 0 - - -  Follow up - Falls evaluation completed - -    Depression Screen Depression screen [facility] Hights 2/9 06/18/2018 06/15/2017 06/06/2016 06/01/2015  Decreased Interest 0 0 0 0  Down, Depressed, Hopeless 0 0 0 0  PHQ - 2 Score 0 0 0 0  Altered sleeping 0 1 - -  Tired, decreased energy 0 0 - -  Change in appetite 0 0 - -    Feeling bad or failure about yourself  0 0 - -  Trouble concentrating 0 0 - -  Moving slowly or fidgety/restless 0 0 - -  Suicidal thoughts 0 0 - -  PHQ-9 Score 0 1 - -  Difficult doing work/chores Not difficult at all Not difficult at all - -   Advanced Directives Does patient have a HCPOA?    no If yes, name and contact information: daughter Does patient have a living will or MOST form?  no  Past Medical History:  Past Medical History:  Diagnosis Date  . Hyperlipidemia   . Hypertension     Surgical History:  Past Surgical History:  Procedure Laterality Date  . ABDOMINAL HYSTERECTOMY    . APPENDECTOMY    . bladder repair    . BREAST EXCISIONAL BIOPSY Left 08/09/1978  . GALLBLADDER SURGERY    . TOE SURGERY      Medications:  Current Outpatient Medications on File Prior to Visit  Medication Sig  . aspirin 81 MG tablet Take 81 mg by mouth daily.  . Calcium Carb-Cholecalciferol [phone] MG-UNIT CAPS Take by mouth.  . IRON PO Take 500 mg by mouth daily.  . Omega-3 Fatty Acids (FISH OIL) 1000 MG CPDR Take by mouth.  . vitamin B-12 (CYANOCOBALAMIN) 1000 MCG tablet Take 1,000 mcg by mouth daily.   No current facility-administered medications on file prior to visit.     Allergies:  No Known Allergies  Social History:  Social History   Socioeconomic History  .  Marital status: Married    Spouse name: Not on file  . Number of children: Not on file  . Years of education: Not on file  . Highest education level: Not on file  Occupational History  . Not on file  Social Needs  . Financial resource strain: Not on file  . Food insecurity:    Worry: Not on file    Inability: Not on file  . Transportation needs:    Medical: Not on file    Non-medical: Not on file  Tobacco Use  . Smoking status: Former Smoker    Last attempt to quit: 04/11/1979    Years since quitting: 39.2  . Smokeless tobacco: Never Used  . Tobacco comment: quit 30 years ago   Substance and Sexual  Activity  . Alcohol use: Yes    Alcohol/week: 0.0 standard drinks    Comment: beer on occasion  . Drug use: No  . Sexual activity: Not Currently  Lifestyle  . Physical activity:    Days per week: Not on file    Minutes per session: Not on file  . Stress: Not on file  Relationships  . Social connections:    Talks on phone: Not on file    Gets together: Not on file    Attends religious service: Not on file    Active member of club or organization: Not on file    Attends meetings of clubs or organizations: Not on file    Relationship status: Not on file  . Intimate partner violence:    Fear of current or ex partner: Not on file    Emotionally abused: Not on file    Physically abused: Not on file    Forced sexual activity: Not on file  Other Topics Concern  . Not on file  Social History Narrative  . Not on file   Social History   Tobacco Use  Smoking Status Former Smoker  . Last attempt to quit: 04/11/1979  . Years since quitting: 39.2  Smokeless Tobacco Never Used  Tobacco Comment   quit 30 years ago    Social History   Substance and Sexual Activity  Alcohol Use Yes  . Alcohol/week: 0.0 standard drinks   Comment: beer on occasion    Family History:  Family History  Problem Relation Age of Onset  . Pancreatic cancer Mother   . Stroke Father   . Alzheimer's disease Paternal Grandmother   . Leukemia Brother   . Breast cancer Neg Hx     Past medical history, surgical history, medications, allergies, family history and social history reviewed with patient today and changes made to appropriate areas of the chart.   Review of Systems  Constitutional: Negative.   HENT: Negative.   Eyes: Negative.   Respiratory: Negative.   Cardiovascular: Negative.   Gastrointestinal: Negative.   Genitourinary: Negative.   Musculoskeletal: Negative.   Skin: Negative.   Neurological: Negative.   Endo/Heme/Allergies: Positive for environmental allergies. Negative for polydipsia.  Bruises/bleeds easily.  Psychiatric/Behavioral: Negative.     All other ROS negative except what is listed above and in the HPI.      Objective:    BP 132/76   Pulse 75   Temp 98.4 F (36.9 C) (Oral)   Ht 5' 3.78" (1.62 m)   Wt 175 lb (79.4 kg)   SpO2 96%   BMI 30.25 kg/m   Wt Readings from Last 3 Encounters:  06/18/18 175 lb (79.4 kg)  12/18/17 182 lb 3  oz (82.6 kg)  06/15/17 178 lb 3 oz (80.8 kg)    Physical Exam Vitals signs and nursing note reviewed.  Constitutional:      General: She is not in acute distress.    Appearance: Normal appearance. She is not ill-appearing, toxic-appearing or diaphoretic.  HENT:     Head: Normocephalic and atraumatic.     Right Ear: Tympanic membrane, ear canal and external ear normal. There is no impacted cerumen.     Left Ear: Tympanic membrane, ear canal and external ear normal. There is no impacted cerumen.     Nose: Nose normal. No congestion or rhinorrhea.     Mouth/Throat:     Mouth: Mucous membranes are moist.     Pharynx: Oropharynx is clear. No oropharyngeal exudate or posterior oropharyngeal erythema.  Eyes:     General: No scleral icterus.       Right eye: No discharge.        Left eye: No discharge.     Extraocular Movements: Extraocular movements intact.     Conjunctiva/sclera: Conjunctivae normal.     Pupils: Pupils are equal, round, and reactive to light.  Neck:     Musculoskeletal: Normal range of motion and neck supple. No neck rigidity or muscular tenderness.     Vascular: No carotid bruit.  Cardiovascular:     Rate and Rhythm: Normal rate and regular rhythm.     Pulses: Normal pulses.     Heart sounds: No murmur. No friction rub. No gallop.   Pulmonary:     Effort: Pulmonary effort is normal. No respiratory distress.     Breath sounds: Normal breath sounds. No stridor. No wheezing, rhonchi or rales.  Chest:     Chest wall: No tenderness.  Abdominal:     General: Abdomen is flat. Bowel sounds are normal. There  is no distension.     Palpations: Abdomen is soft. There is no mass.     Tenderness: There is no abdominal tenderness. There is no right CVA tenderness, left CVA tenderness, guarding or rebound.     Hernia: No hernia is present.  Genitourinary:    Comments: Breast and pelvic exams deferred with shared decision making Musculoskeletal:        General: No swelling, tenderness, deformity or signs of injury.     Right lower leg: No edema.     Left lower leg: No edema.  Lymphadenopathy:     Cervical: No cervical adenopathy.  Skin:    General: Skin is warm and dry.     Capillary Refill: Capillary refill takes less than 2 seconds.     Coloration: Skin is not jaundiced or pale.     Findings: No bruising, erythema, lesion or rash.  Neurological:     General: No focal deficit present.     Mental Status: She is alert and oriented to person, place, and time. Mental status is at baseline.     Cranial Nerves: No cranial nerve deficit.     Sensory: No sensory deficit.     Motor: No weakness.     Coordination: Coordination normal.     Gait: Gait normal.     Deep Tendon Reflexes: Reflexes normal.  Psychiatric:        Mood and Affect: Mood normal.        Behavior: Behavior normal.        Thought Content: Thought content normal.        Judgment: Judgment normal.     6CIT Screen  06/18/2018 06/15/2017 06/06/2016  What Year? 0 points 0 points 0 points  What month? 0 points 0 points 0 points  What time? 0 points 0 points 0 points  Count back from 20 2 points 0 points 0 points  Months in reverse 0 points 0 points 2 points  Repeat phrase 2 points 2 points 2 points  Total Score 4 2 4     Results for orders placed or performed in visit on 12/18/17  Comprehensive metabolic panel  Result Value Ref Range   Glucose 100 (H) 65 - 99 mg/dL   BUN 22 8 - 27 mg/dL   Creatinine, Ser 1.42 (H) 0.57 - 1.00 mg/dL   GFR calc non Af Amer 33 (L) >59 mL/min/1.73   GFR calc Af Amer 39 (L) >59 mL/min/1.73    BUN/Creatinine Ratio 15 12 - 28   Sodium 146 (H) 134 - 144 mmol/L   Potassium 4.6 3.5 - 5.2 mmol/L   Chloride 109 (H) 96 - 106 mmol/L   CO2 20 20 - 29 mmol/L   Calcium 9.7 8.7 - 10.3 mg/dL   Total Protein 6.6 6.0 - 8.5 g/dL   Albumin 4.4 3.5 - 4.7 g/dL   Globulin, Total 2.2 1.5 - 4.5 g/dL   Albumin/Globulin Ratio 2.0 1.2 - 2.2   Bilirubin Total 0.5 0.0 - 1.2 mg/dL   Alkaline Phosphatase 74 39 - 117 IU/L   AST 19 0 - 40 IU/L   ALT 17 0 - 32 IU/L  Lipid Panel w/o Chol/HDL Ratio  Result Value Ref Range   Cholesterol, Total 148 100 - 199 mg/dL   Triglycerides 256 (H) 0 - 149 mg/dL   HDL 37 (L) >39 mg/dL   VLDL Cholesterol Cal 51 (H) 5 - 40 mg/dL   LDL Calculated 60 0 - 99 mg/dL      Assessment & Plan:   Problem List Items Addressed This Visit      Cardiovascular and Mediastinum   Senile purpura ([place])    Reassured patient. Call with any concerns.       Relevant Medications   lisinopril-hydrochlorothiazide (PRINZIDE,ZESTORETIC) 20-25 MG tablet   simvastatin (ZOCOR) 20 MG tablet     Genitourinary   Benign hypertensive renal disease    Under good control on current regimen. Continue current regimen. Continue to monitor. Call with any concerns. Refills given. Labs drawn today.       Relevant Orders   CBC with Differential/Platelet   Comprehensive metabolic panel   Lipid Panel w/o Chol/HDL Ratio   Microalbumin, Urine Waived   TSH   UA/M w/rflx Culture, Routine     Other   Hyperlipidemia    Under good control on current regimen. Continue current regimen. Continue to monitor. Call with any concerns. Refills given. Labs drawn today.       Relevant Medications   lisinopril-hydrochlorothiazide (PRINZIDE,ZESTORETIC) 20-25 MG tablet   simvastatin (ZOCOR) 20 MG tablet   Other Relevant Orders   CBC with Differential/Platelet   Comprehensive metabolic panel   Lipid Panel w/o Chol/HDL Ratio   Microalbumin, Urine Waived   TSH   UA/M w/rflx Culture, Routine   Class 1  obesity due to excess calories with serious comorbidity and body mass index (BMI) of 30.0 to 30.9 in adult    Stable. Continue to monitor. Call with any concerns. No weight loss recommended.      Advance care planning    A voluntary discussion about advance care planning including the explanation and discussion of advance directives  was extensively discussed  with the patient for 7 minutes with patient present.  Explanation about the health care proxy and Living will was reviewed and packet with forms with explanation of how to fill them out was given.  During this discussion, the patient was able to identify a health care proxy as her daughter and plans to fill out the paperwork required.  Patient was offered a separate [place] visit for further assistance with forms.          Other Visit Diagnoses    Medicare annual wellness visit, subsequent    -  Primary   Preventative care discussed today as below. Call with any concerns.    Routine general medical examination at a health care facility       Vaccines up to date. Screening labs checked today. Mammo ordered by patient request. DEXA ordered. Colon N/A. Continue to monitor. Call with concerns.    Screening for breast cancer       Mammogram ordered today.   Relevant Orders   MM DIGITAL SCREENING BILATERAL   Postmenopausal estrogen deficiency       DEXA ordered today.   Relevant Orders   DG Bone Density       Preventative Services:  Health Risk Assessment and Personalized Prevention Plan: Done today Bone Mass Measurements: Ordered today Breast Cancer Screening: Ordered today CVD Screening: Done today Cervical Cancer Screening: N/A Colon Cancer Screening: N/A Depression Screening: Done today Diabetes Screening: Done today Glaucoma Screening: See your eye doctor Hepatitis B vaccine: N/A Hepatitis C screening: N/A HIV Screening: N/A  Flu Vaccine: Up to date Lung cancer Screening: N/A Obesity Screening: Done  today Pneumonia Vaccines (2): Up to date STI Screening: N/A  Follow up plan: Return in about 6 months (around 12/19/2018) for FOllow up.   LABORATORY TESTING:  - Pap smear: not applicable  IMMUNIZATIONS:   - Tdap: Tetanus vaccination status reviewed: last tetanus booster within 10 years. - Influenza: Up to date - Pneumovax: Up to date - Prevnar: Up to date  SCREENING: -Mammogram: Up to date  - Colonoscopy: Not applicable  - Bone Density: Ordered today   PATIENT COUNSELING:   Advised to take 1 mg of folate supplement per day if capable of pregnancy.   Sexuality: Discussed sexually transmitted diseases, partner selection, use of condoms, avoidance of unintended pregnancy  and contraceptive alternatives.   Advised to avoid cigarette smoking.  I discussed with the patient that most people either abstain from alcohol or drink within safe limits (<=14/week and <=4 drinks/occasion for males, <=7/weeks and <= 3 drinks/occasion for females) and that the risk for alcohol disorders and other health effects rises proportionally with the number of drinks per week and how often a drinker exceeds daily limits.  Discussed cessation/primary prevention of drug use and availability of treatment for abuse.   Diet: Encouraged to adjust caloric intake to maintain  or achieve ideal body weight, to reduce intake of dietary saturated fat and total fat, to limit sodium intake by avoiding high sodium foods and not adding table salt, and to maintain adequate dietary potassium and calcium preferably from fresh fruits, vegetables, and low-fat dairy products.    stressed the importance of regular exercise  Injury prevention: Discussed safety belts, safety helmets, smoke detector, smoking near bedding or upholstery.   Dental health: Discussed importance of regular tooth brushing, flossing, and dental visits.    NEXT PREVENTATIVE PHYSICAL DUE IN 1 YEAR. Return in about 6 months (around 12/19/2018) for  FOllow  up.

## 2018-06-19 ENCOUNTER — Encounter: Payer: Self-pay | Admitting: Family Medicine

## 2018-06-19 LAB — CBC WITH DIFFERENTIAL/PLATELET
BASOS ABS: 0.1 10*3/uL (ref 0.0–0.2)
BASOS: 1 %
EOS (ABSOLUTE): 0.3 10*3/uL (ref 0.0–0.4)
Eos: 4 %
HEMOGLOBIN: 12.5 g/dL (ref 11.1–15.9)
Hematocrit: 37 % (ref 34.0–46.6)
Immature Grans (Abs): 0 10*3/uL (ref 0.0–0.1)
Immature Granulocytes: 1 %
LYMPHS ABS: 1.7 10*3/uL (ref 0.7–3.1)
Lymphs: 21 %
MCH: 30.9 pg (ref 26.6–33.0)
MCHC: 33.8 g/dL (ref 31.5–35.7)
MCV: 92 fL (ref 79–97)
Monocytes Absolute: 0.6 10*3/uL (ref 0.1–0.9)
Monocytes: 8 %
NEUTROS ABS: 5.1 10*3/uL (ref 1.4–7.0)
Neutrophils: 65 %
Platelets: 239 10*3/uL (ref 150–450)
RBC: 4.04 x10E6/uL (ref 3.77–5.28)
RDW: 12.2 % (ref 11.7–15.4)
WBC: 7.8 10*3/uL (ref 3.4–10.8)

## 2018-06-19 LAB — LIPID PANEL W/O CHOL/HDL RATIO
Cholesterol, Total: 154 mg/dL (ref 100–199)
HDL: 39 mg/dL — ABNORMAL LOW (ref >39)
LDL CALC: 69 mg/dL (ref 0–99)
TRIGLYCERIDES: 231 mg/dL — AB (ref 0–149)
VLDL CHOLESTEROL CAL: 46 mg/dL — AB (ref 5–40)

## 2018-06-19 LAB — COMPREHENSIVE METABOLIC PANEL
ALBUMIN: 4.5 g/dL (ref 3.6–4.6)
ALK PHOS: 80 IU/L (ref 39–117)
ALT: 15 IU/L (ref 0–32)
AST: 20 IU/L (ref 0–40)
Albumin/Globulin Ratio: 1.9 (ref 1.2–2.2)
BUN / CREAT RATIO: 19 (ref 12–28)
BUN: 28 mg/dL — AB (ref 8–27)
Bilirubin Total: 0.6 mg/dL (ref 0.0–1.2)
CALCIUM: 10 mg/dL (ref 8.7–10.3)
CO2: 17 mmol/L — AB (ref 20–29)
CREATININE: 1.47 mg/dL — AB (ref 0.57–1.00)
Chloride: 109 mmol/L — ABNORMAL HIGH (ref 96–106)
GFR calc Af Amer: 37 mL/min/{1.73_m2} — ABNORMAL LOW (ref >59)
GFR, EST NON AFRICAN AMERICAN: 32 mL/min/{1.73_m2} — AB (ref >59)
GLOBULIN, TOTAL: 2.4 g/dL (ref 1.5–4.5)
GLUCOSE: 100 mg/dL — AB (ref 65–99)
Potassium: 4.6 mmol/L (ref 3.5–5.2)
SODIUM: 146 mmol/L — AB (ref 134–144)
Total Protein: 6.9 g/dL (ref 6.0–8.5)

## 2018-06-19 LAB — TSH: TSH: 1.44 u[IU]/mL (ref 0.450–4.500)

## 2018-06-20 LAB — MICROSCOPIC EXAMINATION: RBC, UA: NONE SEEN /hpf (ref 0–2)

## 2018-06-20 LAB — UA/M W/RFLX CULTURE, ROUTINE
BILIRUBIN UA: NEGATIVE
GLUCOSE, UA: NEGATIVE
KETONES UA: NEGATIVE
NITRITE UA: NEGATIVE
PROTEIN UA: NEGATIVE
RBC, UA: NEGATIVE
SPEC GRAV UA: 1.015 (ref 1.005–1.030)
UUROB: 0.2 mg/dL (ref 0.2–1.0)
pH, UA: 5.5 (ref 5.0–7.5)

## 2018-06-20 LAB — URINE CULTURE, REFLEX

## 2018-06-20 LAB — MICROALBUMIN, URINE WAIVED
Creatinine, Urine Waived: 200 mg/dL (ref 10–300)
Microalb, Ur Waived: 30 mg/L — ABNORMAL HIGH (ref 0–19)

## 2018-06-21 ENCOUNTER — Telehealth: Payer: Self-pay | Admitting: Family Medicine

## 2018-06-21 MED ORDER — AMOXICILLIN 500 MG PO CAPS: 500.0000 mg | ORAL_CAPSULE | Freq: Two times a day (BID) | ORAL | 0 refills | Status: DC

## 2018-06-21 NOTE — Telephone Encounter (Signed)
Please let her know that her urine grew out some bacteria, so I've sent her an antibiotic to her pharmacy. Everything else looks good. Thanks!

## 2018-06-21 NOTE — Telephone Encounter (Signed)
Patient notified

## 2018-10-14 DIAGNOSIS — C44729 Squamous cell carcinoma of skin of left lower limb, including hip: Secondary | ICD-10-CM | POA: Diagnosis not present

## 2018-10-14 DIAGNOSIS — Z08 Encounter for follow-up examination after completed treatment for malignant neoplasm: Secondary | ICD-10-CM | POA: Diagnosis not present

## 2018-10-14 DIAGNOSIS — Z85828 Personal history of other malignant neoplasm of skin: Secondary | ICD-10-CM | POA: Diagnosis not present

## 2018-10-14 DIAGNOSIS — R208 Other disturbances of skin sensation: Secondary | ICD-10-CM | POA: Diagnosis not present

## 2018-10-14 DIAGNOSIS — D485 Neoplasm of uncertain behavior of skin: Secondary | ICD-10-CM | POA: Diagnosis not present

## 2018-10-17 ENCOUNTER — Ambulatory Visit
Admission: RE | Admit: 2018-10-17 | Discharge: 2018-10-17 | Disposition: A | Discharge status: Home / Self Care | Payer: Medicare Other | Source: Ambulatory Visit | Attending: Family Medicine | Admitting: Family Medicine

## 2018-10-17 DIAGNOSIS — Z1231 Encounter for screening mammogram for malignant neoplasm of breast: Secondary | ICD-10-CM | POA: Insufficient documentation

## 2018-10-17 DIAGNOSIS — Z13820 Encounter for screening for osteoporosis: Secondary | ICD-10-CM | POA: Insufficient documentation

## 2018-10-17 DIAGNOSIS — M810 Age-related osteoporosis without current pathological fracture: Secondary | ICD-10-CM | POA: Insufficient documentation

## 2018-10-17 DIAGNOSIS — M85851 Other specified disorders of bone density and structure, right thigh: Secondary | ICD-10-CM | POA: Diagnosis not present

## 2018-10-17 DIAGNOSIS — Z780 Asymptomatic menopausal state: Secondary | ICD-10-CM | POA: Diagnosis not present

## 2018-10-17 DIAGNOSIS — Z1239 Encounter for other screening for malignant neoplasm of breast: Secondary | ICD-10-CM

## 2018-10-17 HISTORY — DX: Malignant (primary) neoplasm, unspecified: C80.1

## 2018-10-20 ENCOUNTER — Encounter: Payer: Self-pay | Admitting: Family Medicine

## 2018-10-20 ENCOUNTER — Telehealth: Payer: Self-pay | Admitting: Family Medicine

## 2018-10-20 NOTE — Telephone Encounter (Signed)
Please let her know that her bone density came back showing osteoporosis. I'd like to start her on some medicine to strengthen her bones. If she's OK with this, let me know and I'll send it in for her. Thanks!

## 2018-10-21 ENCOUNTER — Encounter: Payer: Self-pay | Admitting: Family Medicine

## 2018-10-21 DIAGNOSIS — M810 Age-related osteoporosis without current pathological fracture: Secondary | ICD-10-CM | POA: Insufficient documentation

## 2018-10-21 NOTE — Telephone Encounter (Signed)
Message relayed to patient. Verbalized understanding and denied questions. Patient is declining medication at this time.

## 2018-10-21 NOTE — Telephone Encounter (Signed)
Noted. Thanks.

## 2018-11-04 DIAGNOSIS — C44729 Squamous cell carcinoma of skin of left lower limb, including hip: Secondary | ICD-10-CM | POA: Diagnosis not present

## 2018-12-19 ENCOUNTER — Ambulatory Visit (INDEPENDENT_AMBULATORY_CARE_PROVIDER_SITE_OTHER): Payer: Medicare Other | Admitting: Family Medicine

## 2018-12-19 ENCOUNTER — Other Ambulatory Visit: Payer: Self-pay

## 2018-12-19 ENCOUNTER — Encounter: Payer: Self-pay | Admitting: Family Medicine

## 2018-12-19 VITALS — BP 122/74 | HR 64

## 2018-12-19 DIAGNOSIS — I129 Hypertensive chronic kidney disease with stage 1 through stage 4 chronic kidney disease, or unspecified chronic kidney disease: Secondary | ICD-10-CM | POA: Diagnosis not present

## 2018-12-19 DIAGNOSIS — M810 Age-related osteoporosis without current pathological fracture: Secondary | ICD-10-CM | POA: Diagnosis not present

## 2018-12-19 DIAGNOSIS — Z23 Encounter for immunization: Secondary | ICD-10-CM

## 2018-12-19 DIAGNOSIS — E782 Mixed hyperlipidemia: Secondary | ICD-10-CM | POA: Diagnosis not present

## 2018-12-19 MED ORDER — LISINOPRIL-HYDROCHLOROTHIAZIDE 20-25 MG PO TABS: 1.0000 | ORAL_TABLET | Freq: Every day | ORAL | 1 refills | Status: DC

## 2018-12-19 MED ORDER — ALENDRONATE SODIUM 70 MG PO TABS: 70.0000 mg | ORAL_TABLET | ORAL | 11 refills | Status: DC

## 2018-12-19 MED ORDER — SIMVASTATIN 20 MG PO TABS: 20.0000 mg | ORAL_TABLET | Freq: Every day | ORAL | 1 refills | Status: DC

## 2018-12-19 NOTE — Progress Notes (Signed)
BP 122/74 (BP Location: Left Arm, Cuff Size: Normal)    Pulse 64    SpO2 96%    Subjective:    Patient ID: Faith Cox, female    DOB: 01/21/1930, 85 y.o.   MRN: [id number]  HPI: [name] is a 85 y.o. female  Chief Complaint  Patient presents with   Hypertension   Hyperlipidemia   HYPERTENSION / [place] Satisfied with current treatment? yes Duration of hypertension: chronic BP monitoring frequency: not checking BP medication side effects: no Past BP meds: lisinopril-HCTZ Duration of hyperlipidemia: chronic Cholesterol medication side effects: no Cholesterol supplements: fish oil Past cholesterol medications: simvastatin Medication compliance: excellent compliance Aspirin: yes Recent stressors: no Recurrent headaches: no Visual changes: no Palpitations: no Dyspnea: no Chest pain: no Lower extremity edema: no Dizzy/lightheaded: no  OSTEOPOROSIS Satisfied with current treatment?: no Medication side effects: not on anything Past osteoporosis medications/treatments: none Adequate calcium & vitamin D: yes Intolerance to bisphosphonates:no Weight bearing exercises: yes  Relevant past medical, surgical, family and social history reviewed and updated as indicated. Interim medical history since our last visit reviewed. Allergies and medications reviewed and updated.  Review of Systems  Constitutional: Negative.   Respiratory: Negative.   Cardiovascular: Negative.   Neurological: Negative.   Psychiatric/Behavioral: Negative.     Per HPI unless specifically indicated above     Objective:    BP 122/74 (BP Location: Left Arm, Cuff Size: Normal)    Pulse 64    SpO2 96%   Wt Readings from Last 3 Encounters:  06/18/18 175 lb (79.4 kg)  12/18/17 182 lb 3 oz (82.6 kg)  06/15/17 178 lb 3 oz (80.8 kg)    Physical Exam Vitals signs and nursing note reviewed.  Constitutional:      General: She is not in acute distress.    Appearance: Normal appearance.  She is not ill-appearing, toxic-appearing or diaphoretic.  HENT:     Head: Normocephalic and atraumatic.     Right Ear: External ear normal.     Left Ear: External ear normal.     Nose: Nose normal.     Mouth/Throat:     Mouth: Mucous membranes are moist.     Pharynx: Oropharynx is clear.  Eyes:     General: No scleral icterus.       Right eye: No discharge.        Left eye: No discharge.     Extraocular Movements: Extraocular movements intact.     Conjunctiva/sclera: Conjunctivae normal.     Pupils: Pupils are equal, round, and reactive to light.  Neck:     Musculoskeletal: Normal range of motion and neck supple.  Cardiovascular:     Rate and Rhythm: Normal rate and regular rhythm.     Pulses: Normal pulses.     Heart sounds: Normal heart sounds. No murmur. No friction rub. No gallop.   Pulmonary:     Effort: Pulmonary effort is normal. No respiratory distress.     Breath sounds: Normal breath sounds. No stridor. No wheezing, rhonchi or rales.  Chest:     Chest wall: No tenderness.  Musculoskeletal: Normal range of motion.  Skin:    General: Skin is warm and dry.     Capillary Refill: Capillary refill takes less than 2 seconds.     Coloration: Skin is not jaundiced or pale.     Findings: No bruising, erythema, lesion or rash.  Neurological:     General: No focal deficit present.  Mental Status: She is alert and oriented to person, place, and time. Mental status is at baseline.  Psychiatric:        Mood and Affect: Mood normal.        Behavior: Behavior normal.        Thought Content: Thought content normal.        Judgment: Judgment normal.     Results for orders placed or performed in visit on 06/18/18  Microscopic Examination   URINE  Result Value Ref Range   WBC, UA 0-5 0 - 5 /hpf   RBC, UA None seen 0 - 2 /hpf   Epithelial Cells (non renal) 0-10 0 - 10 /hpf   Casts Present None seen /lpf   Cast Type Hyaline casts N/A   Bacteria, UA Moderate (A) None  seen/Few  Urine Culture, Reflex   URINE  Result Value Ref Range   Urine Culture, Routine Final report (A)    Organism ID, Bacteria Comment (A)   CBC with Differential/Platelet  Result Value Ref Range   WBC 7.8 3.4 - 10.8 x10E3/uL   RBC 4.04 3.77 - 5.28 x10E6/uL   Hemoglobin 12.5 11.1 - 15.9 g/dL   Hematocrit 37.0 34.0 - 46.6 %   MCV 92 79 - 97 fL   MCH 30.9 26.6 - 33.0 pg   MCHC 33.8 31.5 - 35.7 g/dL   RDW 12.2 11.7 - 15.4 %   Platelets 239 150 - 450 x10E3/uL   Neutrophils 65 Not Estab. %   Lymphs 21 Not Estab. %   Monocytes 8 Not Estab. %   Eos 4 Not Estab. %   Basos 1 Not Estab. %   Neutrophils Absolute 5.1 1.4 - 7.0 x10E3/uL   Lymphocytes Absolute 1.7 0.7 - 3.1 x10E3/uL   Monocytes Absolute 0.6 0.1 - 0.9 x10E3/uL   EOS (ABSOLUTE) 0.3 0.0 - 0.4 x10E3/uL   Basophils Absolute 0.1 0.0 - 0.2 x10E3/uL   Immature Granulocytes 1 Not Estab. %   Immature Grans (Abs) 0.0 0.0 - 0.1 x10E3/uL  Comprehensive metabolic panel  Result Value Ref Range   Glucose 100 (H) 65 - 99 mg/dL   BUN 28 (H) 8 - 27 mg/dL   Creatinine, Ser 1.47 (H) 0.57 - 1.00 mg/dL   GFR calc non Af Amer 32 (L) >59 mL/min/1.73   GFR calc Af Amer 37 (L) >59 mL/min/1.73   BUN/Creatinine Ratio 19 12 - 28   Sodium 146 (H) 134 - 144 mmol/L   Potassium 4.6 3.5 - 5.2 mmol/L   Chloride 109 (H) 96 - 106 mmol/L   CO2 17 (L) 20 - 29 mmol/L   Calcium 10.0 8.7 - 10.3 mg/dL   Total Protein 6.9 6.0 - 8.5 g/dL   Albumin 4.5 3.6 - 4.6 g/dL   Globulin, Total 2.4 1.5 - 4.5 g/dL   Albumin/Globulin Ratio 1.9 1.2 - 2.2   Bilirubin Total 0.6 0.0 - 1.2 mg/dL   Alkaline Phosphatase 80 39 - 117 IU/L   AST 20 0 - 40 IU/L   ALT 15 0 - 32 IU/L  Lipid Panel w/o Chol/HDL Ratio  Result Value Ref Range   Cholesterol, Total 154 100 - 199 mg/dL   Triglycerides 231 (H) 0 - 149 mg/dL   HDL 39 (L) >39 mg/dL   VLDL Cholesterol Cal 46 (H) 5 - 40 mg/dL   LDL Calculated 69 0 - 99 mg/dL  Microalbumin, Urine Waived  Result Value Ref Range    Microalb, Ur Waived 30 (H) 0 -  19 mg/L   Creatinine, Urine Waived 200 10 - 300 mg/dL   Microalb/Creat Ratio <30 <30 mg/g  TSH  Result Value Ref Range   TSH 1.440 0.450 - 4.500 uIU/mL  UA/M w/rflx Culture, Routine   Specimen: Urine   URINE  Result Value Ref Range   Specific Gravity, UA 1.015 1.005 - 1.030   pH, UA 5.5 5.0 - 7.5   Color, UA Yellow Yellow   Appearance Ur Clear Clear   Leukocytes, UA Trace (A) Negative   Protein, UA Negative Negative/Trace   Glucose, UA Negative Negative   Ketones, UA Negative Negative   RBC, UA Negative Negative   Bilirubin, UA Negative Negative   Urobilinogen, Ur 0.2 0.2 - 1.0 mg/dL   Nitrite, UA Negative Negative   Microscopic Examination See below:    Urinalysis Reflex Comment       Assessment & Plan:   Problem List Items Addressed This Visit      Musculoskeletal and Integument   Osteoporosis    Would like to try fosamax. Rx sent to her pharmacy. Call with any concerns.       Relevant Medications   alendronate (FOSAMAX) 70 MG tablet     Genitourinary   Benign hypertensive renal disease - Primary    Under good control on current regimen. Continue current regimen. Continue to monitor. Call with any concerns. Refills given. Labs drawn today.        Relevant Orders   Comprehensive metabolic panel     Other   Hyperlipidemia    Under good control on current regimen. Continue current regimen. Continue to monitor. Call with any concerns. Refills given. Labs drawn today.        Relevant Medications   simvastatin (ZOCOR) 20 MG tablet   lisinopril-hydrochlorothiazide (ZESTORETIC) 20-25 MG tablet   Other Relevant Orders   Comprehensive metabolic panel   Lipid Panel w/o Chol/HDL Ratio    Other Visit Diagnoses    Immunization due       Flu shot given today.    Relevant Orders   Flu Vaccine QUAD High Dose(Fluad) (Completed)       Follow up plan: Return in about 6 months (around 06/18/2019) for Physical/wellness.

## 2018-12-19 NOTE — Assessment & Plan Note (Signed)
Under good control on current regimen. Continue current regimen. Continue to monitor. Call with any concerns. Refills given. Labs drawn today.   

## 2018-12-19 NOTE — Assessment & Plan Note (Signed)
Would like to try fosamax. Rx sent to her pharmacy. Call with any concerns.

## 2018-12-20 LAB — LIPID PANEL W/O CHOL/HDL RATIO
Cholesterol, Total: 159 mg/dL (ref 100–199)
HDL: 44 mg/dL (ref >39)
LDL Chol Calc (NIH): 86 mg/dL (ref 0–99)
Triglycerides: 170 mg/dL — ABNORMAL HIGH (ref 0–149)
VLDL Cholesterol Cal: 29 mg/dL (ref 5–40)

## 2018-12-20 LAB — COMPREHENSIVE METABOLIC PANEL
ALT: 12 IU/L (ref 0–32)
AST: 16 IU/L (ref 0–40)
Albumin/Globulin Ratio: 1.9 (ref 1.2–2.2)
Albumin: 4.3 g/dL (ref 3.6–4.6)
Alkaline Phosphatase: 77 IU/L (ref 39–117)
BUN/Creatinine Ratio: 21 (ref 12–28)
BUN: 25 mg/dL (ref 8–27)
Bilirubin Total: 0.6 mg/dL (ref 0.0–1.2)
CO2: 23 mmol/L (ref 20–29)
Calcium: 9.6 mg/dL (ref 8.7–10.3)
Chloride: 106 mmol/L (ref 96–106)
Creatinine, Ser: 1.18 mg/dL — ABNORMAL HIGH (ref 0.57–1.00)
GFR calc Af Amer: 48 mL/min/{1.73_m2} — ABNORMAL LOW (ref >59)
GFR calc non Af Amer: 42 mL/min/{1.73_m2} — ABNORMAL LOW (ref >59)
Globulin, Total: 2.3 g/dL (ref 1.5–4.5)
Glucose: 87 mg/dL (ref 65–99)
Potassium: 4.5 mmol/L (ref 3.5–5.2)
Sodium: 142 mmol/L (ref 134–144)
Total Protein: 6.6 g/dL (ref 6.0–8.5)

## 2018-12-22 ENCOUNTER — Encounter: Payer: Self-pay | Admitting: Family Medicine

## 2019-03-10 ENCOUNTER — Telehealth: Payer: Self-pay

## 2019-03-10 NOTE — Telephone Encounter (Signed)
Routing to provider Copied from [place] [phone]. Topic: General - Call Back - No Documentation >> Mar 10, 2019 12:23 PM Erick Blinks wrote: Pt stopped medication for bone density 02/23/2019, she was experiencing leg cramps and aches. Since she has stopped taking the medication she is not experiencing these symptoms. Best contact: [phone]

## 2019-03-14 NOTE — Telephone Encounter (Signed)
Called patient, no answer, left a message asking patient to return my call.

## 2019-03-14 NOTE — Telephone Encounter (Signed)
OK to stop- does she want to see endocrine to discuss the infusions or shots?

## 2019-03-14 NOTE — Telephone Encounter (Signed)
Patient returned call and spoke with her. Dr. Durenda Age message relayed. Patient states she will think about seeing the endocrine and she will call us back. FYI

## 2019-06-20 ENCOUNTER — Ambulatory Visit: Payer: Medicare Other | Admitting: Family Medicine

## 2019-06-23 ENCOUNTER — Ambulatory Visit (INDEPENDENT_AMBULATORY_CARE_PROVIDER_SITE_OTHER): Payer: Medicare Other | Admitting: Family Medicine

## 2019-06-23 ENCOUNTER — Encounter: Payer: Self-pay | Admitting: Family Medicine

## 2019-06-23 ENCOUNTER — Ambulatory Visit: Payer: Medicare Other

## 2019-06-23 ENCOUNTER — Other Ambulatory Visit: Payer: Self-pay

## 2019-06-23 VITALS — BP 141/75 | HR 64 | Temp 97.7°F | Ht 64.29 in | Wt 176.5 lb

## 2019-06-23 DIAGNOSIS — I129 Hypertensive chronic kidney disease with stage 1 through stage 4 chronic kidney disease, or unspecified chronic kidney disease: Secondary | ICD-10-CM | POA: Diagnosis not present

## 2019-06-23 DIAGNOSIS — M810 Age-related osteoporosis without current pathological fracture: Secondary | ICD-10-CM | POA: Diagnosis not present

## 2019-06-23 DIAGNOSIS — Z0000 Encounter for general adult medical examination without abnormal findings: Secondary | ICD-10-CM | POA: Diagnosis not present

## 2019-06-23 DIAGNOSIS — D692 Other nonthrombocytopenic purpura: Secondary | ICD-10-CM

## 2019-06-23 DIAGNOSIS — E782 Mixed hyperlipidemia: Secondary | ICD-10-CM

## 2019-06-23 LAB — UA/M W/RFLX CULTURE, ROUTINE
Bilirubin, UA: NEGATIVE
Glucose, UA: NEGATIVE
Ketones, UA: NEGATIVE
Leukocytes,UA: NEGATIVE
Nitrite, UA: NEGATIVE
Protein,UA: NEGATIVE
RBC, UA: NEGATIVE
Specific Gravity, UA: 1.02 (ref 1.005–1.030)
Urobilinogen, Ur: 0.2 mg/dL (ref 0.2–1.0)
pH, UA: 5.5 (ref 5.0–7.5)

## 2019-06-23 LAB — MICROALBUMIN, URINE WAIVED
Creatinine, Urine Waived: 100 mg/dL (ref 10–300)
Microalb, Ur Waived: 30 mg/L — ABNORMAL HIGH (ref 0–19)
Microalb/Creat Ratio: <30 mg/g (ref <30)

## 2019-06-23 MED ORDER — LISINOPRIL-HYDROCHLOROTHIAZIDE 20-25 MG PO TABS: 1.0000 | ORAL_TABLET | Freq: Every day | ORAL | 1 refills | Status: DC

## 2019-06-23 MED ORDER — SIMVASTATIN 20 MG PO TABS: 20.0000 mg | ORAL_TABLET | Freq: Every day | ORAL | 1 refills | Status: DC

## 2019-06-23 NOTE — Assessment & Plan Note (Signed)
Due for repeat DEXA in 2023. Tolerating fosamax well. Continue to monitor.

## 2019-06-23 NOTE — Patient Instructions (Addendum)
Preventative Services:  Health Risk Assessment and Personalized Prevention Plan: Done today Bone Mass Measurements: up to date- due 2023 Breast Cancer Screening: N/A CVD Screening: Done today Cervical Cancer Screening: N/a Colon Cancer Screening: N/A Depression Screening: Done today Diabetes Screening: Done today Glaucoma Screening: See your eye doctor Hepatitis B vaccine: N/A Hepatitis C screening: N/A HIV Screening: N/A Flu Vaccine: Up to date Lung cancer Screening: N/A Obesity Screening: Done today Pneumonia Vaccines (2): Up to date STI Screening: N/A   Health Maintenance After Age 12 After age 48, you are at a higher risk for certain long-term diseases and infections as well as injuries from falls. Falls are a major cause of broken bones and head injuries in people who are older than age 43. Getting regular preventive care can help to keep you healthy and well. Preventive care includes getting regular testing and making lifestyle changes as recommended by your health care provider. Talk with your health care provider about:  Which screenings and tests you should have. A screening is a test that checks for a disease when you have no symptoms.  A diet and exercise plan that is right for you. What should I know about screenings and tests to prevent falls? Screening and testing are the best ways to find a health problem early. Early diagnosis and treatment give you the best chance of managing medical conditions that are common after age 67. Certain conditions and lifestyle choices may make you more likely to have a fall. Your health care provider may recommend:  Regular vision checks. Poor vision and conditions such as cataracts can make you more likely to have a fall. If you wear glasses, make sure to get your prescription updated if your vision changes.  Medicine review. Work with your health care provider to regularly review all of the medicines you are taking, including  over-the-counter medicines. Ask your health care provider about any side effects that may make you more likely to have a fall. Tell your health care provider if any medicines that you take make you feel dizzy or sleepy.  Osteoporosis screening. Osteoporosis is a condition that causes the bones to get weaker. This can make the bones weak and cause them to break more easily.  Blood pressure screening. Blood pressure changes and medicines to control blood pressure can make you feel dizzy.  Strength and balance checks. Your health care provider may recommend certain tests to check your strength and balance while standing, walking, or changing positions.  Foot health exam. Foot pain and numbness, as well as not wearing proper footwear, can make you more likely to have a fall.  Depression screening. You may be more likely to have a fall if you have a fear of falling, feel emotionally low, or feel unable to do activities that you used to do.  Alcohol use screening. Using too much alcohol can affect your balance and may make you more likely to have a fall. What actions can I take to lower my risk of falls? General instructions  Talk with your health care provider about your risks for falling. Tell your health care provider if: ? You fall. Be sure to tell your health care provider about all falls, even ones that seem minor. ? You feel dizzy, sleepy, or off-balance.  Take over-the-counter and prescription medicines only as told by your health care provider. These include any supplements.  Eat a healthy diet and maintain a healthy weight. A healthy diet includes low-fat dairy products, low-fat (lean)  meats, and fiber from whole grains, beans, and lots of fruits and vegetables. Home safety  Remove any tripping hazards, such as rugs, cords, and clutter.  Install safety equipment such as grab bars in bathrooms and safety rails on stairs.  Keep rooms and walkways well-lit. Activity   Follow a  regular exercise program to stay fit. This will help you maintain your balance. Ask your health care provider what types of exercise are appropriate for you.  If you need a cane or walker, use it as recommended by your health care provider.  Wear supportive shoes that have nonskid soles. Lifestyle  Do not drink alcohol if your health care provider tells you not to drink.  If you drink alcohol, limit how much you have: ? 0-1 drink a day for women. ? 0-2 drinks a day for men.  Be aware of how much alcohol is in your drink. In the [place] one drink equals one typical bottle of beer (12 oz), one-half glass of wine (5 oz), or one shot of hard liquor (1 oz).  Do not use any products that contain nicotine or tobacco, such as cigarettes and e-cigarettes. If you need help quitting, ask your health care provider. Summary  Having a healthy lifestyle and getting preventive care can help to protect your health and wellness after age 38.  Screening and testing are the best way to find a health problem early and help you avoid having a fall. Early diagnosis and treatment give you the best chance for managing medical conditions that are more common for people who are older than age 54.  Falls are a major cause of broken bones and head injuries in people who are older than age 38. Take precautions to prevent a fall at home.  Work with your health care provider to learn what changes you can make to improve your health and wellness and to prevent falls. This information is not intended to replace advice given to you by your health care provider. Make sure you discuss any questions you have with your health care provider. Document Revised: 07/18/2018 Document Reviewed: 02/07/2017 Elsevier Patient Education  2020 [employer].

## 2019-06-23 NOTE — Assessment & Plan Note (Signed)
Reassured patient. Continue to monitor.  

## 2019-06-23 NOTE — Assessment & Plan Note (Signed)
Under good control on current regimen. Continue current regimen. Continue to monitor. Call with any concerns. Refills given. Labs drawn today.   

## 2019-06-23 NOTE — Progress Notes (Signed)
BP (!) 141/75   Pulse 64   Temp 97.7 F (36.5 C)   Ht 5' 4.29" (1.633 m)   Wt 176 lb 8 oz (80.1 kg)   SpO2 96%   BMI 30.02 kg/m    Subjective:    Patient ID: Faith Cox, female    DOB: Nov 12, 1929, 86 y.o.   MRN: [id number]  HPI: Faith Cox is a 86 y.o. female presenting on 06/23/2019 for comprehensive medical examination. Current medical complaints include:  HYPERTENSION / HYPERLIPIDEMIA Satisfied with current treatment? yes Duration of hypertension: chronic BP monitoring frequency: not checking BP medication side effects: no Past BP meds: lisinopril-HCTZ Duration of hyperlipidemia: chronic Cholesterol medication side effects: no Cholesterol supplements: fish oil Past cholesterol medications: simvastatin Medication compliance: excellent compliance Aspirin: yes Recent stressors: no Recurrent headaches: no Visual changes: no Palpitations: no Dyspnea: no Chest pain: no Lower extremity edema: no Dizzy/lightheaded: no  Menopausal Symptoms: no  Functional Status Survey: Is the patient deaf or have difficulty hearing?: Yes Does the patient have difficulty seeing, even when wearing glasses/contacts?: No Does the patient have difficulty concentrating, remembering, or making decisions?: No Does the patient have difficulty walking or climbing stairs?: No Does the patient have difficulty dressing or bathing?: No Does the patient have difficulty doing errands alone such as visiting a doctor's office or shopping?: No  Fall Risk  06/23/2019 06/18/2018 06/18/2018 06/06/2016 06/01/2015  Falls in the past year? 0 0 0 No No  Number falls in past yr: 0 0 - - -  Injury with Fall? 0 0 - - -  Follow up - - Falls evaluation completed - -    Depression Screen Depression screen [facility] 2/9 06/23/2019 06/18/2018 06/15/2017 06/06/2016 06/01/2015  Decreased Interest 0 0 0 0 0  Down, Depressed, Hopeless 0 0 0 0 0  PHQ - 2 Score 0 0 0 0 0  Altered sleeping 0 0 1 - -  Tired, decreased energy 0 0  0 - -  Change in appetite 0 0 0 - -  Feeling bad or failure about yourself  0 0 0 - -  Trouble concentrating 0 0 0 - -  Moving slowly or fidgety/restless 0 0 0 - -  Suicidal thoughts 0 0 0 - -  PHQ-9 Score 0 0 1 - -  Difficult doing work/chores Not difficult at all Not difficult at all Not difficult at all - -   Advanced Directives Does patient have a HCPOA?    yes Does patient have a living will or MOST form?  no  Past Medical History:  Past Medical History:  Diagnosis Date  . Cancer ([place])    skin  . Hyperlipidemia   . Hypertension     Surgical History:  Past Surgical History:  Procedure Laterality Date  . ABDOMINAL HYSTERECTOMY    . APPENDECTOMY    . bladder repair    . BREAST EXCISIONAL BIOPSY Left 08/09/1978   neg  . GALLBLADDER SURGERY    . TOE SURGERY      Medications:  Current Outpatient Medications on File Prior to Visit  Medication Sig  . aspirin 81 MG tablet Take 81 mg by mouth daily.  . Calcium Carb-Cholecalciferol [phone] MG-UNIT CAPS Take by mouth.  . IRON PO Take 500 mg by mouth daily.  . Omega-3 Fatty Acids (FISH OIL) 1000 MG CPDR Take by mouth.  . vitamin B-12 (CYANOCOBALAMIN) 1000 MCG tablet Take 1,000 mcg by mouth daily.   No current facility-administered medications on  file prior to visit.    Allergies:  No Known Allergies  Social History:  Social History   Socioeconomic History  . Marital status: Married    Spouse name: Not on file  . Number of children: Not on file  . Years of education: Not on file  . Highest education level: Not on file  Occupational History  . Not on file  Tobacco Use  . Smoking status: Former Smoker    Quit date: 04/11/1979    Years since quitting: 40.2  . Smokeless tobacco: Never Used  . Tobacco comment: quit 30 years ago   Substance and Sexual Activity  . Alcohol use: Yes    Alcohol/week: 0.0 standard drinks    Comment: beer on occasion  . Drug use: No  . Sexual activity: Not Currently  Other Topics  Concern  . Not on file  Social History Narrative  . Not on file   Social Determinants of Health   Financial Resource Strain:   . Difficulty of Paying Living Expenses:   Food Insecurity:   . Worried About Charity fundraiser in the Last Year:   . Arboriculturist in the Last Year:   Transportation Needs:   . Film/video editor (Medical):   Marland Kitchen Lack of Transportation (Non-Medical):   Physical Activity:   . Days of Exercise per Week:   . Minutes of Exercise per Session:   Stress:   . Feeling of Stress :   Social Connections:   . Frequency of Communication with Friends and Family:   . Frequency of Social Gatherings with Friends and Family:   . Attends Religious Services:   . Active Member of Clubs or Organizations:   . Attends Archivist Meetings:   Marland Kitchen Marital Status:   Intimate Partner Violence:   . Fear of Current or Ex-Partner:   . Emotionally Abused:   Marland Kitchen Physically Abused:   . Sexually Abused:    Social History   Tobacco Use  Smoking Status Former Smoker  . Quit date: 04/11/1979  . Years since quitting: 40.2  Smokeless Tobacco Never Used  Tobacco Comment   quit 30 years ago    Social History   Substance and Sexual Activity  Alcohol Use Yes  . Alcohol/week: 0.0 standard drinks   Comment: beer on occasion    Family History:  Family History  Problem Relation Age of Onset  . Pancreatic cancer Mother   . Stroke Father   . Alzheimer's disease Paternal Grandmother   . Leukemia Brother   . Breast cancer Neg Hx     Past medical history, surgical history, medications, allergies, family history and social history reviewed with patient today and changes made to appropriate areas of the chart.   Review of Systems  Constitutional: Negative.   HENT: Positive for hearing loss. Negative for congestion, ear discharge, ear pain, nosebleeds, sinus pain, sore throat and tinnitus.   Eyes: Negative.   Respiratory: Negative.  Negative for stridor.   Cardiovascular:  Negative.   Gastrointestinal: Negative.   Genitourinary: Negative.   Musculoskeletal: Negative.   Skin: Negative.   Neurological: Negative.   Endo/Heme/Allergies: Negative for environmental allergies and polydipsia. Bruises/bleeds easily.  Psychiatric/Behavioral: Negative.     All other ROS negative except what is listed above and in the HPI.      Objective:    BP (!) 141/75   Pulse 64   Temp 97.7 F (36.5 C)   Ht 5' 4.29" (1.633 m)  Wt 176 lb 8 oz (80.1 kg)   SpO2 96%   BMI 30.02 kg/m   Wt Readings from Last 3 Encounters:  06/23/19 176 lb 8 oz (80.1 kg)  06/18/18 175 lb (79.4 kg)  12/18/17 182 lb 3 oz (82.6 kg)    Physical Exam Vitals and nursing note reviewed.  Constitutional:      General: She is not in acute distress.    Appearance: Normal appearance. She is not ill-appearing, toxic-appearing or diaphoretic.  HENT:     Head: Normocephalic and atraumatic.     Right Ear: Tympanic membrane, ear canal and external ear normal. There is no impacted cerumen.     Left Ear: Tympanic membrane, ear canal and external ear normal. There is no impacted cerumen.     Nose: Nose normal. No congestion or rhinorrhea.     Mouth/Throat:     Mouth: Mucous membranes are moist.     Pharynx: Oropharynx is clear. No oropharyngeal exudate or posterior oropharyngeal erythema.  Eyes:     General: No scleral icterus.       Right eye: No discharge.        Left eye: No discharge.     Extraocular Movements: Extraocular movements intact.     Conjunctiva/sclera: Conjunctivae normal.     Pupils: Pupils are equal, round, and reactive to light.  Neck:     Vascular: No carotid bruit.  Cardiovascular:     Rate and Rhythm: Normal rate and regular rhythm.     Pulses: Normal pulses.     Heart sounds: No murmur. No friction rub. No gallop.   Pulmonary:     Effort: Pulmonary effort is normal. No respiratory distress.     Breath sounds: Normal breath sounds. No stridor. No wheezing, rhonchi or rales.   Chest:     Chest wall: No tenderness.  Abdominal:     General: Abdomen is flat. Bowel sounds are normal. There is no distension.     Palpations: Abdomen is soft. There is no mass.     Tenderness: There is no abdominal tenderness. There is no right CVA tenderness, left CVA tenderness, guarding or rebound.     Hernia: No hernia is present.  Genitourinary:    Comments: Breast and pelvic exams deferred with shared decision making Musculoskeletal:        General: No swelling, tenderness, deformity or signs of injury.     Cervical back: Normal range of motion and neck supple. No rigidity. No muscular tenderness.     Right lower leg: No edema.     Left lower leg: No edema.  Lymphadenopathy:     Cervical: No cervical adenopathy.  Skin:    General: Skin is warm and dry.     Capillary Refill: Capillary refill takes less than 2 seconds.     Coloration: Skin is not jaundiced or pale.     Findings: No bruising, erythema, lesion or rash.  Neurological:     General: No focal deficit present.     Mental Status: She is alert and oriented to person, place, and time. Mental status is at baseline.     Cranial Nerves: No cranial nerve deficit.     Sensory: No sensory deficit.     Motor: No weakness.     Coordination: Coordination normal.     Gait: Gait normal.     Deep Tendon Reflexes: Reflexes normal.  Psychiatric:        Mood and Affect: Mood normal.  Behavior: Behavior normal.        Thought Content: Thought content normal.        Judgment: Judgment normal.     6CIT Screen 06/23/2019 06/18/2018 06/15/2017 06/06/2016  What Year? 0 points 0 points 0 points 0 points  What month? 0 points 0 points 0 points 0 points  What time? 0 points 0 points 0 points 0 points  Count back from 20 0 points 2 points 0 points 0 points  Months in reverse 0 points 0 points 0 points 2 points  Repeat phrase 0 points 2 points 2 points 2 points  Total Score 0 4 2 4     Results for orders placed or performed in  visit on 12/19/18  Comprehensive metabolic panel  Result Value Ref Range   Glucose 87 65 - 99 mg/dL   BUN 25 8 - 27 mg/dL   Creatinine, Ser 1.18 (H) 0.57 - 1.00 mg/dL   GFR calc non Af Amer 42 (L) >59 mL/min/1.73   GFR calc Af Amer 48 (L) >59 mL/min/1.73   BUN/Creatinine Ratio 21 12 - 28   Sodium 142 134 - 144 mmol/L   Potassium 4.5 3.5 - 5.2 mmol/L   Chloride 106 96 - 106 mmol/L   CO2 23 20 - 29 mmol/L   Calcium 9.6 8.7 - 10.3 mg/dL   Total Protein 6.6 6.0 - 8.5 g/dL   Albumin 4.3 3.6 - 4.6 g/dL   Globulin, Total 2.3 1.5 - 4.5 g/dL   Albumin/Globulin Ratio 1.9 1.2 - 2.2   Bilirubin Total 0.6 0.0 - 1.2 mg/dL   Alkaline Phosphatase 77 39 - 117 IU/L   AST 16 0 - 40 IU/L   ALT 12 0 - 32 IU/L  Lipid Panel w/o Chol/HDL Ratio  Result Value Ref Range   Cholesterol, Total 159 100 - 199 mg/dL   Triglycerides 170 (H) 0 - 149 mg/dL   HDL 44 >39 mg/dL   VLDL Cholesterol Cal 29 5 - 40 mg/dL   LDL Chol Calc (NIH) 86 0 - 99 mg/dL      Assessment & Plan:   Problem List Items Addressed This Visit      Cardiovascular and Mediastinum   Senile purpura ([place])    Reassured patient. Continue to monitor.       Relevant Medications   lisinopril-hydrochlorothiazide (ZESTORETIC) 20-25 MG tablet   simvastatin (ZOCOR) 20 MG tablet   Other Relevant Orders   CBC with Differential/Platelet   Comprehensive metabolic panel     Musculoskeletal and Integument   Osteoporosis    Due for repeat DEXA in 2023. Tolerating fosamax well. Continue to monitor.       Relevant Orders   CBC with Differential/Platelet   Comprehensive metabolic panel   TSH   VITAMIN D 25 Hydroxy (Vit-D Deficiency, Fractures)     Genitourinary   Benign hypertensive renal disease    Under good control on current regimen. Continue current regimen. Continue to monitor. Call with any concerns. Refills given. Labs drawn today.       Relevant Orders   CBC with Differential/Platelet   Comprehensive metabolic panel    Microalbumin, Urine Waived   TSH   UA/M w/rflx Culture, Routine     Other   Hyperlipidemia    Under good control on current regimen. Continue current regimen. Continue to monitor. Call with any concerns. Refills given. Labs drawn today.      Relevant Medications   lisinopril-hydrochlorothiazide (ZESTORETIC) 20-25 MG tablet   simvastatin (ZOCOR)  20 MG tablet   Other Relevant Orders   CBC with Differential/Platelet   Comprehensive metabolic panel   Lipid Panel w/o Chol/HDL Ratio    Other Visit Diagnoses    Wellness examination    -  Primary   Preventative care discussed today as below.    Routine general medical examination at a health care facility       Vaccines up to date. Screening labs checked today. DEXA up to date. Continue diet and exercise. Call with any concerns.       Preventative Services:  Health Risk Assessment and Personalized Prevention Plan: Done today Bone Mass Measurements: up to date- due 2023 Breast Cancer Screening: N/A CVD Screening: Done today Cervical Cancer Screening: N/a Colon Cancer Screening: N/A Depression Screening: Done today Diabetes Screening: Done today Glaucoma Screening: See your eye doctor Hepatitis B vaccine: N/A Hepatitis C screening: N/A HIV Screening: N/A Flu Vaccine: Up to date Lung cancer Screening: N/A Obesity Screening: Done today Pneumonia Vaccines (2): Up to date STI Screening: N/A  Follow up plan: Return in about 6 months (around 12/24/2019).   LABORATORY TESTING:  - Pap smear: not applicable  IMMUNIZATIONS:   - Tdap: Tetanus vaccination status reviewed: last tetanus booster within 10 years. - Influenza: Up to date - Pneumovax: Up to date - Prevnar: Up to date  SCREENING: -Mammogram: Not applicable  - Colonoscopy: Not applicable  - Bone Density: Up to date   PATIENT COUNSELING:   Advised to take 1 mg of folate supplement per day if capable of pregnancy.   Sexuality: Discussed sexually transmitted diseases,  partner selection, use of condoms, avoidance of unintended pregnancy  and contraceptive alternatives.   Advised to avoid cigarette smoking.  I discussed with the patient that most people either abstain from alcohol or drink within safe limits (<=14/week and <=4 drinks/occasion for males, <=7/weeks and <= 3 drinks/occasion for females) and that the risk for alcohol disorders and other health effects rises proportionally with the number of drinks per week and how often a drinker exceeds daily limits.  Discussed cessation/primary prevention of drug use and availability of treatment for abuse.   Diet: Encouraged to adjust caloric intake to maintain  or achieve ideal body weight, to reduce intake of dietary saturated fat and total fat, to limit sodium intake by avoiding high sodium foods and not adding table salt, and to maintain adequate dietary potassium and calcium preferably from fresh fruits, vegetables, and low-fat dairy products.    stressed the importance of regular exercise  Injury prevention: Discussed safety belts, safety helmets, smoke detector, smoking near bedding or upholstery.   Dental health: Discussed importance of regular tooth brushing, flossing, and dental visits.    NEXT PREVENTATIVE PHYSICAL DUE IN 1 YEAR. Return in about 6 months (around 12/24/2019).

## 2019-06-24 ENCOUNTER — Other Ambulatory Visit: Payer: Self-pay | Admitting: Family Medicine

## 2019-06-24 ENCOUNTER — Other Ambulatory Visit: Payer: Self-pay

## 2019-06-24 DIAGNOSIS — N289 Disorder of kidney and ureter, unspecified: Secondary | ICD-10-CM

## 2019-06-24 LAB — COMPREHENSIVE METABOLIC PANEL
ALT: 14 IU/L (ref 0–32)
AST: 13 IU/L (ref 0–40)
Albumin/Globulin Ratio: 2.4 — ABNORMAL HIGH (ref 1.2–2.2)
Albumin: 4.6 g/dL (ref 3.6–4.6)
Alkaline Phosphatase: 74 IU/L (ref 39–117)
BUN/Creatinine Ratio: 14 (ref 12–28)
BUN: 20 mg/dL (ref 8–27)
Bilirubin Total: 0.5 mg/dL (ref 0.0–1.2)
CO2: 22 mmol/L (ref 20–29)
Calcium: 9.6 mg/dL (ref 8.7–10.3)
Chloride: 106 mmol/L (ref 96–106)
Creatinine, Ser: 1.45 mg/dL — ABNORMAL HIGH (ref 0.57–1.00)
GFR calc Af Amer: 37 mL/min/{1.73_m2} — ABNORMAL LOW (ref >59)
GFR calc non Af Amer: 32 mL/min/{1.73_m2} — ABNORMAL LOW (ref >59)
Globulin, Total: 1.9 g/dL (ref 1.5–4.5)
Glucose: 88 mg/dL (ref 65–99)
Potassium: 4.4 mmol/L (ref 3.5–5.2)
Sodium: 143 mmol/L (ref 134–144)
Total Protein: 6.5 g/dL (ref 6.0–8.5)

## 2019-06-24 LAB — CBC WITH DIFFERENTIAL/PLATELET
Basophils Absolute: 0.1 10*3/uL (ref 0.0–0.2)
Basos: 1 %
EOS (ABSOLUTE): 0.3 10*3/uL (ref 0.0–0.4)
Eos: 5 %
Hematocrit: 36 % (ref 34.0–46.6)
Hemoglobin: 12.3 g/dL (ref 11.1–15.9)
Immature Grans (Abs): 0 10*3/uL (ref 0.0–0.1)
Immature Granulocytes: 0 %
Lymphocytes Absolute: 1.7 10*3/uL (ref 0.7–3.1)
Lymphs: 24 %
MCH: 31.9 pg (ref 26.6–33.0)
MCHC: 34.2 g/dL (ref 31.5–35.7)
MCV: 93 fL (ref 79–97)
Monocytes Absolute: 0.6 10*3/uL (ref 0.1–0.9)
Monocytes: 8 %
Neutrophils Absolute: 4.4 10*3/uL (ref 1.4–7.0)
Neutrophils: 62 %
Platelets: 217 10*3/uL (ref 150–450)
RBC: 3.86 x10E6/uL (ref 3.77–5.28)
RDW: 12.1 % (ref 11.7–15.4)
WBC: 7.1 10*3/uL (ref 3.4–10.8)

## 2019-06-24 LAB — TSH: TSH: 1.56 u[IU]/mL (ref 0.450–4.500)

## 2019-06-24 LAB — LIPID PANEL W/O CHOL/HDL RATIO
Cholesterol, Total: 146 mg/dL (ref 100–199)
HDL: 44 mg/dL (ref >39)
LDL Chol Calc (NIH): 72 mg/dL (ref 0–99)
Triglycerides: 179 mg/dL — ABNORMAL HIGH (ref 0–149)
VLDL Cholesterol Cal: 30 mg/dL (ref 5–40)

## 2019-06-24 LAB — VITAMIN D 25 HYDROXY (VIT D DEFICIENCY, FRACTURES): Vit D, 25-Hydroxy: 37.3 ng/mL (ref 30.0–100.0)

## 2019-06-25 ENCOUNTER — Other Ambulatory Visit: Payer: Self-pay | Admitting: Family Medicine

## 2019-07-08 ENCOUNTER — Other Ambulatory Visit: Payer: Self-pay

## 2019-07-08 ENCOUNTER — Other Ambulatory Visit: Payer: Medicare Other

## 2019-07-08 DIAGNOSIS — N289 Disorder of kidney and ureter, unspecified: Secondary | ICD-10-CM

## 2019-07-09 LAB — BASIC METABOLIC PANEL
BUN/Creatinine Ratio: 13 (ref 12–28)
BUN: 17 mg/dL (ref 8–27)
CO2: 21 mmol/L (ref 20–29)
Calcium: 9.7 mg/dL (ref 8.7–10.3)
Chloride: 105 mmol/L (ref 96–106)
Creatinine, Ser: 1.27 mg/dL — ABNORMAL HIGH (ref 0.57–1.00)
GFR calc Af Amer: 44 mL/min/{1.73_m2} — ABNORMAL LOW (ref >59)
GFR calc non Af Amer: 38 mL/min/{1.73_m2} — ABNORMAL LOW (ref >59)
Glucose: 86 mg/dL (ref 65–99)
Potassium: 4.5 mmol/L (ref 3.5–5.2)
Sodium: 143 mmol/L (ref 134–144)

## 2019-07-10 ENCOUNTER — Ambulatory Visit: Payer: Medicare Other

## 2019-07-17 DIAGNOSIS — D2261 Melanocytic nevi of right upper limb, including shoulder: Secondary | ICD-10-CM | POA: Diagnosis not present

## 2019-07-17 DIAGNOSIS — Z85828 Personal history of other malignant neoplasm of skin: Secondary | ICD-10-CM | POA: Diagnosis not present

## 2019-07-17 DIAGNOSIS — D2271 Melanocytic nevi of right lower limb, including hip: Secondary | ICD-10-CM | POA: Diagnosis not present

## 2019-07-17 DIAGNOSIS — D2262 Melanocytic nevi of left upper limb, including shoulder: Secondary | ICD-10-CM | POA: Diagnosis not present

## 2019-08-13 DIAGNOSIS — D0471 Carcinoma in situ of skin of right lower limb, including hip: Secondary | ICD-10-CM | POA: Diagnosis not present

## 2019-08-25 DIAGNOSIS — C44319 Basal cell carcinoma of skin of other parts of face: Secondary | ICD-10-CM | POA: Diagnosis not present

## 2019-08-25 DIAGNOSIS — Z85828 Personal history of other malignant neoplasm of skin: Secondary | ICD-10-CM | POA: Diagnosis not present

## 2019-09-15 ENCOUNTER — Other Ambulatory Visit: Payer: Self-pay | Admitting: Family Medicine

## 2019-09-15 DIAGNOSIS — Z1231 Encounter for screening mammogram for malignant neoplasm of breast: Secondary | ICD-10-CM

## 2019-10-20 ENCOUNTER — Ambulatory Visit
Admission: RE | Admit: 2019-10-20 | Discharge: 2019-10-20 | Disposition: A | Discharge status: Home / Self Care | Payer: Medicare Other | Source: Ambulatory Visit | Attending: Family Medicine | Admitting: Family Medicine

## 2019-10-20 DIAGNOSIS — Z1231 Encounter for screening mammogram for malignant neoplasm of breast: Secondary | ICD-10-CM | POA: Diagnosis not present

## 2019-10-23 ENCOUNTER — Encounter: Payer: Self-pay | Admitting: Family Medicine

## 2019-11-15 ENCOUNTER — Other Ambulatory Visit: Payer: Self-pay | Admitting: Family Medicine

## 2019-11-15 NOTE — Telephone Encounter (Signed)
Requested Prescriptions  Pending Prescriptions Disp Refills   simvastatin (ZOCOR) 20 MG tablet [Pharmacy Med Name: Simvastatin 20 MG Oral Tablet] 90 tablet 0    Sig: TAKE 1 TABLET BY MOUTH AT BEDTIME     Cardiovascular:  Antilipid - Statins Failed - 11/15/2019 11:07 AM      Failed - LDL in normal range and within 360 days    LDL Chol Calc (NIH)  Date Value Ref Range Status  06/23/2019 72 0 - 99 mg/dL Final         Failed - Triglycerides in normal range and within 360 days    Triglycerides  Date Value Ref Range Status  06/23/2019 179 (H) 0 - 149 mg/dL Final   Triglycerides Piccolo,Waived  Date Value Ref Range Status  12/08/2015 149 <150 mg/dL Final    Comment:                            Normal                   <150                         Borderline High     150 - 199                         High                200 - 499                         Very High                >499          Passed - Total Cholesterol in normal range and within 360 days    Cholesterol, Total  Date Value Ref Range Status  06/23/2019 146 100 - 199 mg/dL Final   Cholesterol Piccolo, Waived  Date Value Ref Range Status  12/08/2015 166 <200 mg/dL Final    Comment:                            Desirable                <200                         Borderline High      200- 239                         High                     >239          Passed - HDL in normal range and within 360 days    HDL  Date Value Ref Range Status  06/23/2019 44 >39 mg/dL Final         Passed - Patient is not pregnant      Passed - Valid encounter within last 12 months    Recent Outpatient Visits          4 months ago Wellness examination   [place], Megan P, DO   11 months ago Benign hypertensive renal disease   [facility] [place], Rafael Hernandez, DO  1 year ago Medicare annual wellness visit, subsequent   [place], [place], DO   1 year ago Mixed hyperlipidemia    [place], [place], DO   2 years ago Encounter for [employer] annual wellness exam   [place], DO      Future Appointments            In 1 month [name], Barb Merino, DO [facility], [place]   In 7 months  [facility], [place]

## 2019-11-16 ENCOUNTER — Other Ambulatory Visit: Payer: Self-pay | Admitting: Family Medicine

## 2019-11-17 ENCOUNTER — Other Ambulatory Visit: Payer: Self-pay | Admitting: Family Medicine

## 2019-11-17 ENCOUNTER — Ambulatory Visit: Payer: Medicare Other

## 2019-11-18 ENCOUNTER — Other Ambulatory Visit: Payer: Self-pay | Admitting: Family Medicine

## 2019-11-19 ENCOUNTER — Other Ambulatory Visit: Payer: Self-pay | Admitting: Family Medicine

## 2019-12-24 DIAGNOSIS — D225 Melanocytic nevi of trunk: Secondary | ICD-10-CM | POA: Diagnosis not present

## 2019-12-24 DIAGNOSIS — Z85828 Personal history of other malignant neoplasm of skin: Secondary | ICD-10-CM | POA: Diagnosis not present

## 2019-12-24 DIAGNOSIS — D2262 Melanocytic nevi of left upper limb, including shoulder: Secondary | ICD-10-CM | POA: Diagnosis not present

## 2019-12-24 DIAGNOSIS — D2261 Melanocytic nevi of right upper limb, including shoulder: Secondary | ICD-10-CM | POA: Diagnosis not present

## 2019-12-24 HISTORY — PX: BASAL CELL CARCINOMA EXCISION: SHX1214

## 2019-12-26 ENCOUNTER — Encounter: Payer: Self-pay | Admitting: Family Medicine

## 2019-12-26 ENCOUNTER — Other Ambulatory Visit: Payer: Self-pay

## 2019-12-26 ENCOUNTER — Ambulatory Visit: Payer: Medicare Other | Admitting: Family Medicine

## 2019-12-26 VITALS — BP 154/79 | HR 66 | Temp 98.1°F | Wt 180.0 lb

## 2019-12-26 DIAGNOSIS — Z23 Encounter for immunization: Secondary | ICD-10-CM

## 2019-12-26 DIAGNOSIS — I129 Hypertensive chronic kidney disease with stage 1 through stage 4 chronic kidney disease, or unspecified chronic kidney disease: Secondary | ICD-10-CM | POA: Diagnosis not present

## 2019-12-26 DIAGNOSIS — E782 Mixed hyperlipidemia: Secondary | ICD-10-CM

## 2019-12-26 MED ORDER — SIMVASTATIN 20 MG PO TABS: 20.0000 mg | ORAL_TABLET | Freq: Every day | ORAL | 1 refills | Status: DC

## 2019-12-26 MED ORDER — LISINOPRIL-HYDROCHLOROTHIAZIDE 20-25 MG PO TABS: 1.0000 | ORAL_TABLET | Freq: Every day | ORAL | 1 refills | Status: DC

## 2019-12-26 NOTE — Assessment & Plan Note (Addendum)
Running a little high today, but better at home. Will continue to monitor at home and recheck 3 months, if still high consider low dose amlodipine.

## 2019-12-26 NOTE — Assessment & Plan Note (Signed)
Under good control on current regimen. Continue current regimen. Continue to monitor. Call with any concerns. Refills given. Labs drawn today.   

## 2019-12-26 NOTE — Progress Notes (Signed)
BP (!) 154/79    Pulse 66    Temp 98.1 F (36.7 C) (Oral)    Wt 180 lb (81.6 kg)    SpO2 96%    BMI 30.62 kg/m    Subjective:    Patient ID: Faith Cox, female    DOB: 11/21/1929, 86 y.o.   MRN: [id number]  HPI: Faith Cox is a 86 y.o. female  Chief Complaint  Patient presents with   Hypertension   Hyperlipidemia   HYPERTENSION / [place] Satisfied with current treatment? yes Duration of hypertension: chronic BP monitoring frequency: a few times a week BP range: 130s/70s BP medication side effects: no Past BP meds: lisinopril-HCTZ Duration of hyperlipidemia: chronic Cholesterol medication side effects: no Cholesterol supplements: fish oil Past cholesterol medications: simvastatin Medication compliance: excellent compliance Aspirin: no Recent stressors: no Recurrent headaches: no Visual changes: no Palpitations: no Dyspnea: no Chest pain: no Lower extremity edema: no Dizzy/lightheaded: no   Relevant past medical, surgical, family and social history reviewed and updated as indicated. Interim medical history since our last visit reviewed. Allergies and medications reviewed and updated.  Review of Systems  Constitutional: Negative.   Respiratory: Negative.   Cardiovascular: Negative.   Gastrointestinal: Negative.   Musculoskeletal: Negative.   Neurological: Negative.   Psychiatric/Behavioral: Negative.     Per HPI unless specifically indicated above     Objective:    BP (!) 154/79    Pulse 66    Temp 98.1 F (36.7 C) (Oral)    Wt 180 lb (81.6 kg)    SpO2 96%    BMI 30.62 kg/m   Wt Readings from Last 3 Encounters:  12/26/19 180 lb (81.6 kg)  06/23/19 176 lb 8 oz (80.1 kg)  06/18/18 175 lb (79.4 kg)    Physical Exam Vitals and nursing note reviewed.  Constitutional:      General: She is not in acute distress.    Appearance: Normal appearance. She is not ill-appearing, toxic-appearing or diaphoretic.  HENT:     Head: Normocephalic and  atraumatic.     Right Ear: External ear normal.     Left Ear: External ear normal.     Nose: Nose normal.     Mouth/Throat:     Mouth: Mucous membranes are moist.     Pharynx: Oropharynx is clear.  Eyes:     General: No scleral icterus.       Right eye: No discharge.        Left eye: No discharge.     Extraocular Movements: Extraocular movements intact.     Conjunctiva/sclera: Conjunctivae normal.     Pupils: Pupils are equal, round, and reactive to light.  Cardiovascular:     Rate and Rhythm: Normal rate and regular rhythm.     Pulses: Normal pulses.     Heart sounds: Normal heart sounds. No murmur heard.  No friction rub. No gallop.   Pulmonary:     Effort: Pulmonary effort is normal. No respiratory distress.     Breath sounds: Normal breath sounds. No stridor. No wheezing, rhonchi or rales.  Chest:     Chest wall: No tenderness.  Musculoskeletal:        General: Normal range of motion.     Cervical back: Normal range of motion and neck supple.  Skin:    General: Skin is warm and dry.     Capillary Refill: Capillary refill takes less than 2 seconds.     Coloration: Skin is not jaundiced  or pale.     Findings: No bruising, erythema, lesion or rash.  Neurological:     General: No focal deficit present.     Mental Status: She is alert and oriented to person, place, and time. Mental status is at baseline.  Psychiatric:        Mood and Affect: Mood normal.        Behavior: Behavior normal.        Thought Content: Thought content normal.        Judgment: Judgment normal.     Results for orders placed or performed in visit on 89/16/94  Basic metabolic panel  Result Value Ref Range   Glucose 86 65 - 99 mg/dL   BUN 17 8 - 27 mg/dL   Creatinine, Ser 1.27 (H) 0.57 - 1.00 mg/dL   GFR calc non Af Amer 38 (L) >59 mL/min/1.73   GFR calc Af Amer 44 (L) >59 mL/min/1.73   BUN/Creatinine Ratio 13 12 - 28   Sodium 143 134 - 144 mmol/L   Potassium 4.5 3.5 - 5.2 mmol/L   Chloride  105 96 - 106 mmol/L   CO2 21 20 - 29 mmol/L   Calcium 9.7 8.7 - 10.3 mg/dL      Assessment & Plan:   Problem List Items Addressed This Visit      Genitourinary   Benign hypertensive renal disease - Primary    Running a little high today, but better at home. Will continue to monitor at home and recheck 3 months, if still high consider low dose amlodipine.        Relevant Orders   Comprehensive metabolic panel     Other   Hyperlipidemia    Under good control on current regimen. Continue current regimen. Continue to monitor. Call with any concerns. Refills given. Labs drawn today.       Relevant Medications   lisinopril-hydrochlorothiazide (ZESTORETIC) 20-25 MG tablet   simvastatin (ZOCOR) 20 MG tablet   Other Relevant Orders   Comprehensive metabolic panel   Lipid Panel w/o Chol/HDL Ratio    Other Visit Diagnoses    Flu vaccine need       Flu shot given today.   Relevant Orders   Flu Vaccine QUAD High Dose(Fluad) (Completed)       Follow up plan: Return in about 3 months (around 03/26/2020).

## 2019-12-27 LAB — COMPREHENSIVE METABOLIC PANEL
ALT: 14 IU/L (ref 0–32)
AST: 16 IU/L (ref 0–40)
Albumin/Globulin Ratio: 2 (ref 1.2–2.2)
Albumin: 4.4 g/dL (ref 3.6–4.6)
Alkaline Phosphatase: 72 IU/L (ref 44–121)
BUN/Creatinine Ratio: 20 (ref 12–28)
BUN: 26 mg/dL (ref 8–27)
Bilirubin Total: 0.5 mg/dL (ref 0.0–1.2)
CO2: 21 mmol/L (ref 20–29)
Calcium: 9.4 mg/dL (ref 8.7–10.3)
Chloride: 105 mmol/L (ref 96–106)
Creatinine, Ser: 1.3 mg/dL — ABNORMAL HIGH (ref 0.57–1.00)
GFR calc Af Amer: 42 mL/min/{1.73_m2} — ABNORMAL LOW (ref >59)
GFR calc non Af Amer: 37 mL/min/{1.73_m2} — ABNORMAL LOW (ref >59)
Globulin, Total: 2.2 g/dL (ref 1.5–4.5)
Glucose: 101 mg/dL — ABNORMAL HIGH (ref 65–99)
Potassium: 4.6 mmol/L (ref 3.5–5.2)
Sodium: 140 mmol/L (ref 134–144)
Total Protein: 6.6 g/dL (ref 6.0–8.5)

## 2019-12-27 LAB — LIPID PANEL W/O CHOL/HDL RATIO
Cholesterol, Total: 150 mg/dL (ref 100–199)
HDL: 41 mg/dL (ref >39)
LDL Chol Calc (NIH): 77 mg/dL (ref 0–99)
Triglycerides: 189 mg/dL — ABNORMAL HIGH (ref 0–149)
VLDL Cholesterol Cal: 32 mg/dL (ref 5–40)

## 2019-12-29 ENCOUNTER — Encounter: Payer: Self-pay | Admitting: Family Medicine

## 2020-01-12 ENCOUNTER — Ambulatory Visit (INDEPENDENT_AMBULATORY_CARE_PROVIDER_SITE_OTHER): Payer: Medicare Other | Admitting: Nurse Practitioner

## 2020-01-12 ENCOUNTER — Encounter: Payer: Self-pay | Admitting: Nurse Practitioner

## 2020-01-12 ENCOUNTER — Other Ambulatory Visit: Payer: Self-pay

## 2020-01-12 VITALS — BP 161/76 | HR 74 | Temp 98.0°F | Ht 64.0 in | Wt 182.0 lb

## 2020-01-12 DIAGNOSIS — I129 Hypertensive chronic kidney disease with stage 1 through stage 4 chronic kidney disease, or unspecified chronic kidney disease: Secondary | ICD-10-CM | POA: Diagnosis not present

## 2020-01-12 MED ORDER — AMLODIPINE BESYLATE 2.5 MG PO TABS
2.5000 mg | ORAL_TABLET | Freq: Every day | ORAL | 0 refills | Status: DC
Start: 2020-01-12 — End: 2020-02-03

## 2020-01-12 NOTE — Patient Instructions (Signed)
DASH Eating Plan DASH stands for "Dietary Approaches to Stop Hypertension." The DASH eating plan is a healthy eating plan that has been shown to reduce high blood pressure (hypertension). It may also reduce your risk for type 2 diabetes, heart disease, and stroke. The DASH eating plan may also help with weight loss. What are tips for following this plan?  General guidelines  Avoid eating more than 2,300 mg (milligrams) of salt (sodium) a day. If you have hypertension, you may need to reduce your sodium intake to 1,500 mg a day.  Limit alcohol intake to no more than 1 drink a day for nonpregnant women and 2 drinks a day for men. One drink equals 12 oz of beer, 5 oz of wine, or 1 oz of hard liquor.  Work with your health care provider to maintain a healthy body weight or to lose weight. Ask what an ideal weight is for you.  Get at least 30 minutes of exercise that causes your heart to beat faster (aerobic exercise) most days of the week. Activities may include walking, swimming, or biking.  Work with your health care provider or diet and nutrition specialist (dietitian) to adjust your eating plan to your individual calorie needs. Reading food labels   Check food labels for the amount of sodium per serving. Choose foods with less than 5 percent of the Daily Value of sodium. Generally, foods with less than 300 mg of sodium per serving fit into this eating plan.  To find whole grains, look for the word "whole" as the first word in the ingredient list. Shopping  Buy products labeled as "low-sodium" or "no salt added."  Buy fresh foods. Avoid canned foods and premade or frozen meals. Cooking  Avoid adding salt when cooking. Use salt-free seasonings or herbs instead of table salt or sea salt. Check with your health care provider or pharmacist before using salt substitutes.  Do not fry foods. Cook foods using healthy methods such as baking, boiling, grilling, and broiling instead.  Cook with  heart-healthy oils, such as olive, canola, soybean, or sunflower oil. Meal planning  Eat a balanced diet that includes: ? 5 or more servings of fruits and vegetables each day. At each meal, try to fill half of your plate with fruits and vegetables. ? Up to 6-8 servings of whole grains each day. ? Less than 6 oz of lean meat, poultry, or fish each day. A 3-oz serving of meat is about the same size as a deck of cards. One egg equals 1 oz. ? 2 servings of low-fat dairy each day. ? A serving of nuts, seeds, or beans 5 times each week. ? Heart-healthy fats. Healthy fats called Omega-3 fatty acids are found in foods such as flaxseeds and coldwater fish, like sardines, salmon, and mackerel.  Limit how much you eat of the following: ? Canned or prepackaged foods. ? Food that is high in trans fat, such as fried foods. ? Food that is high in saturated fat, such as fatty meat. ? Sweets, desserts, sugary drinks, and other foods with added sugar. ? Full-fat dairy products.  Do not salt foods before eating.  Try to eat at least 2 vegetarian meals each week.  Eat more home-cooked food and less restaurant, buffet, and fast food.  When eating at a restaurant, ask that your food be prepared with less salt or no salt, if possible. What foods are recommended? The items listed may not be a complete list. Talk with your dietitian about   what dietary choices are best for you. Grains Whole-grain or whole-wheat bread. Whole-grain or whole-wheat pasta. Brown rice. Oatmeal. Quinoa. Bulgur. Whole-grain and low-sodium cereals. Pita bread. Low-fat, low-sodium crackers. Whole-wheat flour tortillas. Vegetables Fresh or frozen vegetables (raw, steamed, roasted, or grilled). Low-sodium or reduced-sodium tomato and vegetable juice. Low-sodium or reduced-sodium tomato sauce and tomato paste. Low-sodium or reduced-sodium canned vegetables. Fruits All fresh, dried, or frozen fruit. Canned fruit in natural juice (without  added sugar). Meat and other protein foods Skinless chicken or turkey. Ground chicken or turkey. Pork with fat trimmed off. Fish and seafood. Egg whites. Dried beans, peas, or lentils. Unsalted nuts, nut butters, and seeds. Unsalted canned beans. Lean cuts of beef with fat trimmed off. Low-sodium, lean deli meat. Dairy Low-fat (1%) or fat-free (skim) milk. Fat-free, low-fat, or reduced-fat cheeses. Nonfat, low-sodium ricotta or cottage cheese. Low-fat or nonfat yogurt. Low-fat, low-sodium cheese. Fats and oils Soft margarine without trans fats. Vegetable oil. Low-fat, reduced-fat, or light mayonnaise and salad dressings (reduced-sodium). Canola, safflower, olive, soybean, and sunflower oils. Avocado. Seasoning and other foods Herbs. Spices. Seasoning mixes without salt. Unsalted popcorn and pretzels. Fat-free sweets. What foods are not recommended? The items listed may not be a complete list. Talk with your dietitian about what dietary choices are best for you. Grains Baked goods made with fat, such as croissants, muffins, or some breads. Dry pasta or rice meal packs. Vegetables Creamed or fried vegetables. Vegetables in a cheese sauce. Regular canned vegetables (not low-sodium or reduced-sodium). Regular canned tomato sauce and paste (not low-sodium or reduced-sodium). Regular tomato and vegetable juice (not low-sodium or reduced-sodium). Pickles. Olives. Fruits Canned fruit in a light or heavy syrup. Fried fruit. Fruit in cream or butter sauce. Meat and other protein foods Fatty cuts of meat. Ribs. Fried meat. Bacon. Sausage. Bologna and other processed lunch meats. Salami. Fatback. Hotdogs. Bratwurst. Salted nuts and seeds. Canned beans with added salt. Canned or smoked fish. Whole eggs or egg yolks. Chicken or turkey with skin. Dairy Whole or 2% milk, cream, and half-and-half. Whole or full-fat cream cheese. Whole-fat or sweetened yogurt. Full-fat cheese. Nondairy creamers. Whipped toppings.  Processed cheese and cheese spreads. Fats and oils Butter. Stick margarine. Lard. Shortening. Ghee. Bacon fat. Tropical oils, such as coconut, palm kernel, or palm oil. Seasoning and other foods Salted popcorn and pretzels. Onion salt, garlic salt, seasoned salt, table salt, and sea salt. Worcestershire sauce. Tartar sauce. Barbecue sauce. Teriyaki sauce. Soy sauce, including reduced-sodium. Steak sauce. Canned and packaged gravies. Fish sauce. Oyster sauce. Cocktail sauce. Horseradish that you find on the shelf. Ketchup. Mustard. Meat flavorings and tenderizers. Bouillon cubes. Hot sauce and Tabasco sauce. Premade or packaged marinades. Premade or packaged taco seasonings. Relishes. Regular salad dressings. Where to find more information:  National Heart, Lung, and Blood Institute: www.nhlbi.nih.gov  American Heart Association: www.heart.org Summary  The DASH eating plan is a healthy eating plan that has been shown to reduce high blood pressure (hypertension). It may also reduce your risk for type 2 diabetes, heart disease, and stroke.  With the DASH eating plan, you should limit salt (sodium) intake to 2,300 mg a day. If you have hypertension, you may need to reduce your sodium intake to 1,500 mg a day.  When on the DASH eating plan, aim to eat more fresh fruits and vegetables, whole grains, lean proteins, low-fat dairy, and heart-healthy fats.  Work with your health care provider or diet and nutrition specialist (dietitian) to adjust your eating plan to your   individual calorie needs. This information is not intended to replace advice given to you by your health care provider. Make sure you discuss any questions you have with your health care provider. Document Revised: 03/09/2017 Document Reviewed: 03/20/2016 Elsevier Patient Education  2020 Elsevier Inc.  

## 2020-01-12 NOTE — Progress Notes (Signed)
BP (!) 161/76 (BP Location: Left Arm, Patient Position: Sitting)   Pulse 74   Temp 98 F (36.7 C) (Oral)   Ht 5\' 4"  (1.626 m)   Wt 182 lb (82.6 kg)   SpO2 96%   BMI 31.24 kg/m    Subjective:    Patient ID: Faith Cox, female    DOB: Sep 04, 1929, 86 y.o.   MRN: [id number]  HPI: Faith Cox is a 86 y.o. female presenting with blood pressure concerns.  Chief Complaint  Patient presents with  . Hypertension    yesterday morning 173/66, this morning 182/66, light headed, pt has taken bp medication today    HYPERTENSION Hypertension status: uncontrolled  Satisfied with current treatment? no Duration of hypertension: chronic BP monitoring frequency:  daily BP range:  173/66; normally is 130/60s BP medication side effects:  no Medication compliance: excellent compliance Previous BP meds: lisinopril-HCTZ 20-25 Aspirin: yes Recurrent headaches: yes Visual changes: no Palpitations: no Dyspnea: no Chest pain: no Lower extremity edema: no Dizzy/lightheaded: yes  No Known Allergies  Outpatient Encounter Medications as of 01/12/2020  Medication Sig  . aspirin 81 MG tablet Take 81 mg by mouth daily.  . Calcium Carb-Cholecalciferol [phone] MG-UNIT CAPS Take by mouth.  . IRON PO Take 500 mg by mouth daily.  Marland Kitchen lisinopril-hydrochlorothiazide (ZESTORETIC) 20-25 MG tablet Take 1 tablet by mouth daily.  . Omega-3 Fatty Acids (FISH OIL) 1000 MG CPDR Take by mouth.  . simvastatin (ZOCOR) 20 MG tablet Take 1 tablet (20 mg total) by mouth at bedtime.  . vitamin B-12 (CYANOCOBALAMIN) 1000 MCG tablet Take 1,000 mcg by mouth daily.  Marland Kitchen amLODipine (NORVASC) 2.5 MG tablet Take 1 tablet (2.5 mg total) by mouth daily.   No facility-administered encounter medications on file as of 01/12/2020.   Patient Active Problem List   Diagnosis Date Noted  . Osteoporosis 10/21/2018  . Senile purpura ([place]) 06/18/2018  . Class 1 obesity due to excess calories with serious comorbidity and body mass  index (BMI) of 30.0 to 30.9 in adult 06/18/2018  . Advance care planning 06/18/2018  . Knee pain 03/14/2016  . Hyperlipidemia   . Benign hypertensive renal disease   . Burning feet syndrome 05/20/2014  . CA skin, basal cell 03/15/2012   Past Medical History:  Diagnosis Date  . Cancer ([place])    skin  . Hyperlipidemia   . Hypertension    Relevant past medical, surgical, family and social history reviewed and updated as indicated. Interim medical history since our last visit reviewed.  Review of Systems  Constitutional: Negative.  Negative for activity change, fatigue and fever.  Respiratory: Negative.  Negative for chest tightness, shortness of breath and wheezing.   Cardiovascular: Negative.  Negative for chest pain, palpitations and leg swelling.  Skin: Negative.   Neurological: Positive for dizziness and headaches. Negative for facial asymmetry, weakness, light-headedness and numbness.  Psychiatric/Behavioral: Negative.  Negative for agitation and sleep disturbance. The patient is not nervous/anxious.     Per HPI unless specifically indicated above     Objective:    BP (!) 161/76 (BP Location: Left Arm, Patient Position: Sitting)   Pulse 74   Temp 98 F (36.7 C) (Oral)   Ht 5\' 4"  (1.626 m)   Wt 182 lb (82.6 kg)   SpO2 96%   BMI 31.24 kg/m   Wt Readings from Last 3 Encounters:  01/12/20 182 lb (82.6 kg)  12/26/19 180 lb (81.6 kg)  06/23/19 176 lb 8 oz (80.1  kg)    Physical Exam Vitals and nursing note reviewed.  Constitutional:      General: She is not in acute distress.    Appearance: Normal appearance. She is not toxic-appearing.  HENT:     Head: Normocephalic and atraumatic.  Eyes:     General: No scleral icterus.       Right eye: No discharge.        Left eye: No discharge.     Extraocular Movements: Extraocular movements intact.     Pupils: Pupils are equal, round, and reactive to light.  Cardiovascular:     Rate and Rhythm: Normal rate and regular  rhythm.     Heart sounds: Normal heart sounds.  Pulmonary:     Effort: Pulmonary effort is normal. No respiratory distress.     Breath sounds: Normal breath sounds. No wheezing, rhonchi or rales.  Skin:    General: Skin is warm and dry.     Coloration: Skin is not jaundiced or pale.     Findings: No erythema.  Neurological:     Mental Status: She is alert and oriented to person, place, and time.     Gait: Gait normal.  Psychiatric:        Mood and Affect: Mood normal.        Behavior: Behavior normal.        Thought Content: Thought content normal.        Judgment: Judgment normal.        Assessment & Plan:   Problem List Items Addressed This Visit      Genitourinary   Benign hypertensive renal disease - Primary    Chronic, uncontrolled.  BP high at home and in clinic today.  Will start amlodipine 2.5 mg daily and recheck in 2 weeks.           Follow up plan: Return in about 2 weeks (around 01/26/2020) for BP follow up.

## 2020-01-12 NOTE — Assessment & Plan Note (Addendum)
Chronic, uncontrolled.  BP high at home and in clinic today.  Will start amlodipine 2.5 mg daily and recheck in 2 weeks.

## 2020-01-19 ENCOUNTER — Other Ambulatory Visit: Payer: Self-pay | Admitting: Family Medicine

## 2020-01-20 ENCOUNTER — Other Ambulatory Visit: Payer: Self-pay | Admitting: Family Medicine

## 2020-01-21 ENCOUNTER — Ambulatory Visit: Payer: Self-pay

## 2020-01-21 NOTE — Telephone Encounter (Signed)
Patient advised as below. Patient verbalizes understanding and is in agreement with treatment plan.  

## 2020-01-21 NOTE — Telephone Encounter (Signed)
Start taking 2 of her amlodipine and we'll see her at her appointment.

## 2020-01-21 NOTE — Telephone Encounter (Signed)
Pt. Reports she started Amlodipine 1 week ago. Still having elevated BP readings. Yesterday - [id number], 163/60. This morning 150/72. Has pressure "on my head, not really a headache." "Feel woozy sometimes." No availability in the practice. Please advise. Spoke with [place].  Reason for Disposition  Systolic BP  >= 012 OR Diastolic >= 224  Answer Assessment - Initial Assessment Questions 1. BLOOD PRESSURE: "What is the blood pressure?" "Did you take at least two measurements 5 minutes apart?"     150/72 2. ONSET: "When did you take your blood pressure?"     Yesterday 3. HOW: "How did you obtain the blood pressure?" (e.g., visiting nurse, automatic home BP monitor)     Home monitor 4. HISTORY: "Do you have a history of high blood pressure?"     Yes 5. MEDICATIONS: "Are you taking any medications for blood pressure?" "Have you missed any doses recently?"     No 6. OTHER SYMPTOMS: "Do you have any symptoms?" (e.g., headache, chest pain, blurred vision, difficulty breathing, weakness)     Pressure 7. PREGNANCY: "Is there any chance you are pregnant?" "When was your last menstrual period?"     No  Protocols used: BLOOD PRESSURE - HIGH-A-AH

## 2020-01-22 DIAGNOSIS — C44319 Basal cell carcinoma of skin of other parts of face: Secondary | ICD-10-CM | POA: Diagnosis not present

## 2020-01-29 DIAGNOSIS — D044 Carcinoma in situ of skin of scalp and neck: Secondary | ICD-10-CM | POA: Diagnosis not present

## 2020-02-02 NOTE — Progress Notes (Signed)
BP 139/68   Pulse 69   Wt 179 lb 12.8 oz (81.6 kg)   SpO2 99%   BMI 30.86 kg/m    Subjective:    Patient ID: Faith Cox, female    DOB: 09-Mar-1930, 86 y.o.   MRN: [id number]  HPI: Faith Cox is a 86 y.o. female presenting for blood pressure follow up.  Chief Complaint  Patient presents with  . Hypertension    Just started Amlodipine 2.5 mg, was taking 2 tabs up until Saturday when she went back down to 1 QD so she would not run out of medication   HYPERTENSION Was recently started back on her amlodipine in conjunction with lisinopril-hctz.  When she was taking the 5 mg, 130-150s/ Hypertension status: uncontrolled  Satisfied with current treatment? no Duration of hypertension: chronic BP monitoring frequency:  daily BP range:  BP medication side effects:  no Medication compliance: excellent compliance Previous BP meds: amlodipine 2.5 mg,  Aspirin: yes Recurrent headaches: no Visual changes: no Palpitations: no Dyspnea: no Chest pain: no Lower extremity edema: no Dizzy/lightheaded: no  No Known Allergies  Outpatient Encounter Medications as of 02/03/2020  Medication Sig  . amLODipine (NORVASC) 5 MG tablet Take 1 tablet (5 mg total) by mouth daily.  Marland Kitchen aspirin 81 MG tablet Take 81 mg by mouth daily.  . Calcium Carb-Cholecalciferol [phone] MG-UNIT CAPS Take by mouth.  . IRON PO Take 500 mg by mouth daily.  Marland Kitchen lisinopril-hydrochlorothiazide (ZESTORETIC) 20-25 MG tablet Take 1 tablet by mouth daily.  . Omega-3 Fatty Acids (FISH OIL) 1000 MG CPDR Take by mouth.  . simvastatin (ZOCOR) 20 MG tablet Take 1 tablet (20 mg total) by mouth at bedtime.  . vitamin B-12 (CYANOCOBALAMIN) 1000 MCG tablet Take 1,000 mcg by mouth daily.  . [DISCONTINUED] amLODipine (NORVASC) 2.5 MG tablet Take 1 tablet (2.5 mg total) by mouth daily.   No facility-administered encounter medications on file as of 02/03/2020.   Patient Active Problem List   Diagnosis Date Noted  . Osteoporosis  10/21/2018  . Senile purpura ([place]) 06/18/2018  . Class 1 obesity due to excess calories with serious comorbidity and body mass index (BMI) of 30.0 to 30.9 in adult 06/18/2018  . Advance care planning 06/18/2018  . Knee pain 03/14/2016  . Hyperlipidemia   . Benign hypertensive renal disease   . Burning feet syndrome 05/20/2014  . CA skin, basal cell 03/15/2012   Past Medical History:  Diagnosis Date  . Cancer ([place])    skin  . Hyperlipidemia   . Hypertension    Relevant past medical, surgical, family and social history reviewed and updated as indicated. Interim medical history since our last visit reviewed. Allergies and medications reviewed and updated.  Review of Systems  Constitutional: Negative.  Negative for fever.  Eyes: Negative.  Negative for visual disturbance.  Respiratory: Negative.  Negative for cough, chest tightness, shortness of breath and wheezing.   Cardiovascular: Negative.  Negative for chest pain, palpitations and leg swelling.  Neurological: Negative.  Negative for dizziness, light-headedness and headaches.  Psychiatric/Behavioral: Negative.     Per HPI unless specifically indicated above     Objective:    BP 139/68   Pulse 69   Wt 179 lb 12.8 oz (81.6 kg)   SpO2 99%   BMI 30.86 kg/m   Wt Readings from Last 3 Encounters:  02/03/20 179 lb 12.8 oz (81.6 kg)  01/12/20 182 lb (82.6 kg)  12/26/19 180 lb (81.6 kg)  Physical Exam Vitals and nursing note reviewed.  Constitutional:      General: She is not in acute distress.    Appearance: Normal appearance. She is not toxic-appearing.  Cardiovascular:     Rate and Rhythm: Normal rate and regular rhythm.     Heart sounds: Normal heart sounds. No murmur heard.   Pulmonary:     Effort: Pulmonary effort is normal. No respiratory distress.     Breath sounds: Normal breath sounds. No wheezing, rhonchi or rales.  Neurological:     General: No focal deficit present.     Mental Status: She is alert and  oriented to person, place, and time.     Motor: No weakness.     Gait: Gait normal.  Psychiatric:        Mood and Affect: Mood normal.        Behavior: Behavior normal.        Thought Content: Thought content normal.        Judgment: Judgment normal.       Assessment & Plan:   Problem List Items Addressed This Visit      Genitourinary   Benign hypertensive renal disease - Primary    Chronic, ongoing.  Blood pressure was slightly better at home after increasing amlodipine from 2.5 mg to 5 mg.  We will continue on 5 mg and follow-up in 2 more weeks.  Patient encouraged to monitor blood pressure at home and notify clinic if running higher than 140/90 and/or lower than 100/60.          Follow up plan: Return in about 2 weeks (around 02/17/2020) for BP check.

## 2020-02-02 NOTE — Patient Instructions (Signed)
DASH Eating Plan DASH stands for "Dietary Approaches to Stop Hypertension." The DASH eating plan is a healthy eating plan that has been shown to reduce high blood pressure (hypertension). It may also reduce your risk for type 2 diabetes, heart disease, and stroke. The DASH eating plan may also help with weight loss. What are tips for following this plan?  General guidelines  Avoid eating more than 2,300 mg (milligrams) of salt (sodium) a day. If you have hypertension, you may need to reduce your sodium intake to 1,500 mg a day.  Limit alcohol intake to no more than 1 drink a day for nonpregnant women and 2 drinks a day for men. One drink equals 12 oz of beer, 5 oz of wine, or 1 oz of hard liquor.  Work with your health care provider to maintain a healthy body weight or to lose weight. Ask what an ideal weight is for you.  Get at least 30 minutes of exercise that causes your heart to beat faster (aerobic exercise) most days of the week. Activities may include walking, swimming, or biking.  Work with your health care provider or diet and nutrition specialist (dietitian) to adjust your eating plan to your individual calorie needs. Reading food labels   Check food labels for the amount of sodium per serving. Choose foods with less than 5 percent of the Daily Value of sodium. Generally, foods with less than 300 mg of sodium per serving fit into this eating plan.  To find whole grains, look for the word "whole" as the first word in the ingredient list. Shopping  Buy products labeled as "low-sodium" or "no salt added."  Buy fresh foods. Avoid canned foods and premade or frozen meals. Cooking  Avoid adding salt when cooking. Use salt-free seasonings or herbs instead of table salt or sea salt. Check with your health care provider or pharmacist before using salt substitutes.  Do not fry foods. Cook foods using healthy methods such as baking, boiling, grilling, and broiling instead.  Cook with  heart-healthy oils, such as olive, canola, soybean, or sunflower oil. Meal planning  Eat a balanced diet that includes: ? 5 or more servings of fruits and vegetables each day. At each meal, try to fill half of your plate with fruits and vegetables. ? Up to 6-8 servings of whole grains each day. ? Less than 6 oz of lean meat, poultry, or fish each day. A 3-oz serving of meat is about the same size as a deck of cards. One egg equals 1 oz. ? 2 servings of low-fat dairy each day. ? A serving of nuts, seeds, or beans 5 times each week. ? Heart-healthy fats. Healthy fats called Omega-3 fatty acids are found in foods such as flaxseeds and coldwater fish, like sardines, salmon, and mackerel.  Limit how much you eat of the following: ? Canned or prepackaged foods. ? Food that is high in trans fat, such as fried foods. ? Food that is high in saturated fat, such as fatty meat. ? Sweets, desserts, sugary drinks, and other foods with added sugar. ? Full-fat dairy products.  Do not salt foods before eating.  Try to eat at least 2 vegetarian meals each week.  Eat more home-cooked food and less restaurant, buffet, and fast food.  When eating at a restaurant, ask that your food be prepared with less salt or no salt, if possible. What foods are recommended? The items listed may not be a complete list. Talk with your dietitian about   what dietary choices are best for you. Grains Whole-grain or whole-wheat bread. Whole-grain or whole-wheat pasta. Brown rice. Oatmeal. Quinoa. Bulgur. Whole-grain and low-sodium cereals. Pita bread. Low-fat, low-sodium crackers. Whole-wheat flour tortillas. Vegetables Fresh or frozen vegetables (raw, steamed, roasted, or grilled). Low-sodium or reduced-sodium tomato and vegetable juice. Low-sodium or reduced-sodium tomato sauce and tomato paste. Low-sodium or reduced-sodium canned vegetables. Fruits All fresh, dried, or frozen fruit. Canned fruit in natural juice (without  added sugar). Meat and other protein foods Skinless chicken or turkey. Ground chicken or turkey. Pork with fat trimmed off. Fish and seafood. Egg whites. Dried beans, peas, or lentils. Unsalted nuts, nut butters, and seeds. Unsalted canned beans. Lean cuts of beef with fat trimmed off. Low-sodium, lean deli meat. Dairy Low-fat (1%) or fat-free (skim) milk. Fat-free, low-fat, or reduced-fat cheeses. Nonfat, low-sodium ricotta or cottage cheese. Low-fat or nonfat yogurt. Low-fat, low-sodium cheese. Fats and oils Soft margarine without trans fats. Vegetable oil. Low-fat, reduced-fat, or light mayonnaise and salad dressings (reduced-sodium). Canola, safflower, olive, soybean, and sunflower oils. Avocado. Seasoning and other foods Herbs. Spices. Seasoning mixes without salt. Unsalted popcorn and pretzels. Fat-free sweets. What foods are not recommended? The items listed may not be a complete list. Talk with your dietitian about what dietary choices are best for you. Grains Baked goods made with fat, such as croissants, muffins, or some breads. Dry pasta or rice meal packs. Vegetables Creamed or fried vegetables. Vegetables in a cheese sauce. Regular canned vegetables (not low-sodium or reduced-sodium). Regular canned tomato sauce and paste (not low-sodium or reduced-sodium). Regular tomato and vegetable juice (not low-sodium or reduced-sodium). Pickles. Olives. Fruits Canned fruit in a light or heavy syrup. Fried fruit. Fruit in cream or butter sauce. Meat and other protein foods Fatty cuts of meat. Ribs. Fried meat. Bacon. Sausage. Bologna and other processed lunch meats. Salami. Fatback. Hotdogs. Bratwurst. Salted nuts and seeds. Canned beans with added salt. Canned or smoked fish. Whole eggs or egg yolks. Chicken or turkey with skin. Dairy Whole or 2% milk, cream, and half-and-half. Whole or full-fat cream cheese. Whole-fat or sweetened yogurt. Full-fat cheese. Nondairy creamers. Whipped toppings.  Processed cheese and cheese spreads. Fats and oils Butter. Stick margarine. Lard. Shortening. Ghee. Bacon fat. Tropical oils, such as coconut, palm kernel, or palm oil. Seasoning and other foods Salted popcorn and pretzels. Onion salt, garlic salt, seasoned salt, table salt, and sea salt. Worcestershire sauce. Tartar sauce. Barbecue sauce. Teriyaki sauce. Soy sauce, including reduced-sodium. Steak sauce. Canned and packaged gravies. Fish sauce. Oyster sauce. Cocktail sauce. Horseradish that you find on the shelf. Ketchup. Mustard. Meat flavorings and tenderizers. Bouillon cubes. Hot sauce and Tabasco sauce. Premade or packaged marinades. Premade or packaged taco seasonings. Relishes. Regular salad dressings. Where to find more information:  National Heart, Lung, and Blood Institute: www.nhlbi.nih.gov  American Heart Association: www.heart.org Summary  The DASH eating plan is a healthy eating plan that has been shown to reduce high blood pressure (hypertension). It may also reduce your risk for type 2 diabetes, heart disease, and stroke.  With the DASH eating plan, you should limit salt (sodium) intake to 2,300 mg a day. If you have hypertension, you may need to reduce your sodium intake to 1,500 mg a day.  When on the DASH eating plan, aim to eat more fresh fruits and vegetables, whole grains, lean proteins, low-fat dairy, and heart-healthy fats.  Work with your health care provider or diet and nutrition specialist (dietitian) to adjust your eating plan to your   individual calorie needs. This information is not intended to replace advice given to you by your health care provider. Make sure you discuss any questions you have with your health care provider. Document Revised: 03/09/2017 Document Reviewed: 03/20/2016 Elsevier Patient Education  2020 Elsevier Inc.  

## 2020-02-03 ENCOUNTER — Ambulatory Visit (INDEPENDENT_AMBULATORY_CARE_PROVIDER_SITE_OTHER): Payer: Medicare Other | Admitting: Nurse Practitioner

## 2020-02-03 ENCOUNTER — Other Ambulatory Visit: Payer: Self-pay

## 2020-02-03 ENCOUNTER — Encounter: Payer: Self-pay | Admitting: Nurse Practitioner

## 2020-02-03 VITALS — BP 139/68 | HR 69 | Wt 179.8 lb

## 2020-02-03 DIAGNOSIS — I129 Hypertensive chronic kidney disease with stage 1 through stage 4 chronic kidney disease, or unspecified chronic kidney disease: Secondary | ICD-10-CM

## 2020-02-03 MED ORDER — AMLODIPINE BESYLATE 5 MG PO TABS: 5.0000 mg | ORAL_TABLET | Freq: Every day | ORAL | 0 refills | Status: DC

## 2020-02-03 NOTE — Assessment & Plan Note (Addendum)
Chronic, ongoing.  Blood pressure was slightly better at home after increasing amlodipine from 2.5 mg to 5 mg.  We will continue on 5 mg and follow-up in 2 more weeks.  Patient encouraged to monitor blood pressure at home and notify clinic if running higher than 140/90 and/or lower than 100/60.

## 2020-02-17 ENCOUNTER — Other Ambulatory Visit: Payer: Self-pay

## 2020-02-17 ENCOUNTER — Encounter: Payer: Self-pay | Admitting: Family Medicine

## 2020-02-17 ENCOUNTER — Ambulatory Visit: Payer: Medicare Other | Admitting: Family Medicine

## 2020-02-17 VITALS — BP 132/73 | HR 56 | Temp 98.2°F | Wt 182.4 lb

## 2020-02-17 DIAGNOSIS — I129 Hypertensive chronic kidney disease with stage 1 through stage 4 chronic kidney disease, or unspecified chronic kidney disease: Secondary | ICD-10-CM

## 2020-02-17 MED ORDER — AMLODIPINE BESYLATE 5 MG PO TABS
5.0000 mg | ORAL_TABLET | Freq: Every day | ORAL | 1 refills | Status: DC
Start: 2020-02-17 — End: 2020-04-05

## 2020-02-17 NOTE — Progress Notes (Signed)
BP 132/73   Pulse (!) 56   Temp 98.2 F (36.8 C)   Wt 182 lb 6.4 oz (82.7 kg)   SpO2 97%   BMI 31.31 kg/m    Subjective:    Patient ID: Faith Cox, female    DOB: [birth year], 86 y.o.   MRN: [id number]  HPI: Faith Cox is a 86 y.o. female  Chief Complaint  Patient presents with  . Hypertension   HYPERTENSION Hypertension status: controlled  Satisfied with current treatment? yes Duration of hypertension: chronic BP monitoring frequency:  not checking BP medication side effects:  no Medication compliance: excellent compliance Previous BP meds: lisinopril-HCTZ, amlodipine Aspirin: no Recurrent headaches: no Visual changes: no Palpitations: no Dyspnea: no Chest pain: no Lower extremity edema: no Dizzy/lightheaded: yes  Relevant past medical, surgical, family and social history reviewed and updated as indicated. Interim medical history since our last visit reviewed. Allergies and medications reviewed and updated.  Review of Systems  Constitutional: Negative.   Respiratory: Negative.   Cardiovascular: Negative.   Gastrointestinal: Negative.   Musculoskeletal: Negative.   Psychiatric/Behavioral: Negative.     Per HPI unless specifically indicated above     Objective:    BP 132/73   Pulse (!) 56   Temp 98.2 F (36.8 C)   Wt 182 lb 6.4 oz (82.7 kg)   SpO2 97%   BMI 31.31 kg/m   Wt Readings from Last 3 Encounters:  02/17/20 182 lb 6.4 oz (82.7 kg)  02/03/20 179 lb 12.8 oz (81.6 kg)  01/12/20 182 lb (82.6 kg)    Physical Exam Vitals and nursing note reviewed.  Constitutional:      General: She is not in acute distress.    Appearance: Normal appearance. She is not ill-appearing, toxic-appearing or diaphoretic.  HENT:     Head: Normocephalic and atraumatic.     Right Ear: External ear normal.     Left Ear: External ear normal.     Nose: Nose normal.     Mouth/Throat:     Mouth: Mucous membranes are moist.     Pharynx: Oropharynx is clear.    Eyes:     General: No scleral icterus.       Right eye: No discharge.        Left eye: No discharge.     Extraocular Movements: Extraocular movements intact.     Conjunctiva/sclera: Conjunctivae normal.     Pupils: Pupils are equal, round, and reactive to light.  Cardiovascular:     Rate and Rhythm: Normal rate and regular rhythm.     Pulses: Normal pulses.     Heart sounds: Normal heart sounds. No murmur heard.  No friction rub. No gallop.   Pulmonary:     Effort: Pulmonary effort is normal. No respiratory distress.     Breath sounds: Normal breath sounds. No stridor. No wheezing, rhonchi or rales.  Chest:     Chest wall: No tenderness.  Musculoskeletal:        General: Normal range of motion.     Cervical back: Normal range of motion and neck supple.  Skin:    General: Skin is warm and dry.     Capillary Refill: Capillary refill takes less than 2 seconds.     Coloration: Skin is not jaundiced or pale.     Findings: No bruising, erythema, lesion or rash.  Neurological:     General: No focal deficit present.     Mental Status: She is alert and  oriented to person, place, and time. Mental status is at baseline.  Psychiatric:        Mood and Affect: Mood normal.        Behavior: Behavior normal.        Thought Content: Thought content normal.        Judgment: Judgment normal.     Results for orders placed or performed in visit on 12/26/19  Comprehensive metabolic panel  Result Value Ref Range   Glucose 101 (H) 65 - 99 mg/dL   BUN 26 8 - 27 mg/dL   Creatinine, Ser 1.30 (H) 0.57 - 1.00 mg/dL   GFR calc non Af Amer 37 (L) >59 mL/min/1.73   GFR calc Af Amer 42 (L) >59 mL/min/1.73   BUN/Creatinine Ratio 20 12 - 28   Sodium 140 134 - 144 mmol/L   Potassium 4.6 3.5 - 5.2 mmol/L   Chloride 105 96 - 106 mmol/L   CO2 21 20 - 29 mmol/L   Calcium 9.4 8.7 - 10.3 mg/dL   Total Protein 6.6 6.0 - 8.5 g/dL   Albumin 4.4 3.6 - 4.6 g/dL   Globulin, Total 2.2 1.5 - 4.5 g/dL    Albumin/Globulin Ratio 2.0 1.2 - 2.2   Bilirubin Total 0.5 0.0 - 1.2 mg/dL   Alkaline Phosphatase 72 44 - 121 IU/L   AST 16 0 - 40 IU/L   ALT 14 0 - 32 IU/L  Lipid Panel w/o Chol/HDL Ratio  Result Value Ref Range   Cholesterol, Total 150 100 - 199 mg/dL   Triglycerides 189 (H) 0 - 149 mg/dL   HDL 41 >39 mg/dL   VLDL Cholesterol Cal 32 5 - 40 mg/dL   LDL Chol Calc (NIH) 77 0 - 99 mg/dL      Assessment & Plan:   Problem List Items Addressed This Visit      Genitourinary   Benign hypertensive renal disease - Primary    Under good control on current regimen. Continue current regimen. Continue to monitor. Call with any concerns. Refills given. Labs drawn today.       Relevant Orders   Basic metabolic panel       Follow up plan: Return OK to cancel appt in Dec- 6 month follow up in March.

## 2020-02-17 NOTE — Assessment & Plan Note (Signed)
Under good control on current regimen. Continue current regimen. Continue to monitor. Call with any concerns. Refills given. Labs drawn today.   

## 2020-02-18 LAB — BASIC METABOLIC PANEL
BUN/Creatinine Ratio: 17 (ref 12–28)
BUN: 25 mg/dL (ref 8–27)
CO2: 22 mmol/L (ref 20–29)
Calcium: 9.8 mg/dL (ref 8.7–10.3)
Chloride: 102 mmol/L (ref 96–106)
Creatinine, Ser: 1.51 mg/dL — ABNORMAL HIGH (ref 0.57–1.00)
GFR calc Af Amer: 35 mL/min/{1.73_m2} — ABNORMAL LOW (ref >59)
GFR calc non Af Amer: 31 mL/min/{1.73_m2} — ABNORMAL LOW (ref >59)
Glucose: 102 mg/dL — ABNORMAL HIGH (ref 65–99)
Potassium: 5 mmol/L (ref 3.5–5.2)
Sodium: 137 mmol/L (ref 134–144)

## 2020-02-19 ENCOUNTER — Other Ambulatory Visit: Payer: Self-pay | Admitting: Family Medicine

## 2020-02-19 MED ORDER — LISINOPRIL 20 MG PO TABS: 20.0000 mg | ORAL_TABLET | Freq: Every day | ORAL | 1 refills | Status: DC

## 2020-02-26 ENCOUNTER — Telehealth: Payer: Self-pay

## 2020-02-26 NOTE — Telephone Encounter (Signed)
Patient returned my call. Discussed medications and cleared them up with her. Patient did not realize that the Lisinopril/HCTZ that she was taking was a combo pill, 2 medications in one. Explained that she is now only supposed to be taking lisinopril and a new prescription was sent in for this to [facility]. Patient verbalized understanding.

## 2020-02-26 NOTE — Telephone Encounter (Signed)
Called to discuss with patient, LVM asking for patient to please return my call.   Patient was taken off of Lisinopril HCTZ due to kidney function, see result note.

## 2020-02-26 NOTE — Telephone Encounter (Signed)
Copied from [place] [id number]. Topic: General - Other >> Feb 26, 2020  8:21 AM Leward Quan A wrote: Reason for CRM: Patient called in to inform Dr [name] that she have stopped the medication discussed and that her BP is now elevated since stopping it. She is asking for a call back with an answer to why she was taken off this particular medication lisinopril-hydrochlorothiazide (PRINZIDE,ZESTORETIC) 20-25 MG per tablet. Please call  Ph# [phone]   Last office visit 02/17/2020, I did not see information of stopping any medication in her chart ,would patient need appointment for this?  S.Faith Cox.

## 2020-04-05 ENCOUNTER — Encounter: Payer: Self-pay | Admitting: Family Medicine

## 2020-04-05 ENCOUNTER — Other Ambulatory Visit: Payer: Self-pay

## 2020-04-05 ENCOUNTER — Ambulatory Visit: Payer: Medicare Other | Admitting: Family Medicine

## 2020-04-05 VITALS — BP 143/78 | HR 74 | Temp 98.0°F | Wt 181.6 lb

## 2020-04-05 DIAGNOSIS — I129 Hypertensive chronic kidney disease with stage 1 through stage 4 chronic kidney disease, or unspecified chronic kidney disease: Secondary | ICD-10-CM | POA: Diagnosis not present

## 2020-04-05 MED ORDER — LISINOPRIL 40 MG PO TABS
40.0000 mg | ORAL_TABLET | Freq: Every day | ORAL | 1 refills | Status: DC
Start: 2020-04-05 — End: 2020-07-07

## 2020-04-05 NOTE — Progress Notes (Signed)
BP (!) 143/78 (BP Location: Left Arm, Cuff Size: Normal)   Pulse 74   Temp 98 F (36.7 C) (Oral)   Wt 181 lb 9.6 oz (82.4 kg)   SpO2 99%   BMI 31.17 kg/m    Subjective:    Patient ID: Faith Cox, female    DOB: 04/29/1929, 86 y.o.   MRN: [id number]  HPI: [name] is a 86 y.o. female  Chief Complaint  Patient presents with  . Hypertension    1 month f/up- pt states she has been getting readings in the 120's and 130's at home   HYPERTENSION- has been taking her HCTZ for about a month, stopped it initially, but then ended up with a lot of swelling since starting the amlodipine. Did not call- just went back on her HCTZ Hypertension status: stable  Satisfied with current treatment? yes Duration of hypertension: chronic BP monitoring frequency:  a few times a week BP range: 120s-130s BP medication side effects:  Yes- swelling on the amlodipine Medication compliance: excellent compliance Previous BP meds: lisinopril and amlodipine, HCTZ Aspirin: yes Recurrent headaches: no Visual changes: no Palpitations: no Dyspnea: no Chest pain: no Lower extremity edema: yes- when she was travelling and since starting the amlodpine Dizzy/lightheaded: no  Relevant past medical, surgical, family and social history reviewed and updated as indicated. Interim medical history since our last visit reviewed. Allergies and medications reviewed and updated.  Review of Systems  Constitutional: Negative.   Respiratory: Negative.   Cardiovascular: Negative.   Gastrointestinal: Negative.   Musculoskeletal: Negative.   Psychiatric/Behavioral: Negative.     Per HPI unless specifically indicated above     Objective:    BP (!) 143/78 (BP Location: Left Arm, Cuff Size: Normal)   Pulse 74   Temp 98 F (36.7 C) (Oral)   Wt 181 lb 9.6 oz (82.4 kg)   SpO2 99%   BMI 31.17 kg/m   Wt Readings from Last 3 Encounters:  04/05/20 181 lb 9.6 oz (82.4 kg)  02/17/20 182 lb 6.4 oz (82.7 kg)   02/03/20 179 lb 12.8 oz (81.6 kg)    Physical Exam Vitals and nursing note reviewed.  Constitutional:      General: She is not in acute distress.    Appearance: Normal appearance. She is not ill-appearing, toxic-appearing or diaphoretic.  HENT:     Head: Normocephalic and atraumatic.     Right Ear: External ear normal.     Left Ear: External ear normal.     Nose: Nose normal.     Mouth/Throat:     Mouth: Mucous membranes are moist.     Pharynx: Oropharynx is clear.  Eyes:     General: No scleral icterus.       Right eye: No discharge.        Left eye: No discharge.     Extraocular Movements: Extraocular movements intact.     Conjunctiva/sclera: Conjunctivae normal.     Pupils: Pupils are equal, round, and reactive to light.  Cardiovascular:     Rate and Rhythm: Normal rate and regular rhythm.     Pulses: Normal pulses.     Heart sounds: Normal heart sounds. No murmur heard. No friction rub. No gallop.   Pulmonary:     Effort: Pulmonary effort is normal. No respiratory distress.     Breath sounds: Normal breath sounds. No stridor. No wheezing, rhonchi or rales.  Chest:     Chest wall: No tenderness.  Musculoskeletal:  General: Normal range of motion.     Cervical back: Normal range of motion and neck supple.  Skin:    General: Skin is warm and dry.     Capillary Refill: Capillary refill takes less than 2 seconds.     Coloration: Skin is not jaundiced or pale.     Findings: No bruising, erythema, lesion or rash.  Neurological:     General: No focal deficit present.     Mental Status: She is alert and oriented to person, place, and time. Mental status is at baseline.  Psychiatric:        Mood and Affect: Mood normal.        Behavior: Behavior normal.        Thought Content: Thought content normal.        Judgment: Judgment normal.     Results for orders placed or performed in visit on [id number]  Basic metabolic panel  Result Value Ref Range   Glucose 102 (H)  65 - 99 mg/dL   BUN 25 8 - 27 mg/dL   Creatinine, Ser 1.51 (H) 0.57 - 1.00 mg/dL   GFR calc non Af Amer 31 (L) >59 mL/min/1.73   GFR calc Af Amer 35 (L) >59 mL/min/1.73   BUN/Creatinine Ratio 17 12 - 28   Sodium 137 134 - 144 mmol/L   Potassium 5.0 3.5 - 5.2 mmol/L   Chloride 102 96 - 106 mmol/L   CO2 22 20 - 29 mmol/L   Calcium 9.8 8.7 - 10.3 mg/dL      Assessment & Plan:   Problem List Items Addressed This Visit      Genitourinary   Benign hypertensive renal disease - Primary    Discussed again that we stopped her HCTZ due to her kidney function- had swelling with the amlodipine. Stop amlodipine and HCTZ, increase lisinopril to 40mg  and recheck 2 weeks. Checking BMP today- unlikely to be significantly different as she has been taking the HCTZ. Await results. Refills given.       Relevant Orders   Basic metabolic panel       Follow up plan: Return in about 2 weeks (around 04/19/2020).

## 2020-04-05 NOTE — Assessment & Plan Note (Signed)
Discussed again that we stopped her HCTZ due to her kidney function- had swelling with the amlodipine. Stop amlodipine and HCTZ, increase lisinopril to 40mg  and recheck 2 weeks. Checking BMP today- unlikely to be significantly different as she has been taking the HCTZ. Await results. Refills given.

## 2020-04-06 ENCOUNTER — Ambulatory Visit: Payer: Medicare Other | Admitting: Family Medicine

## 2020-04-06 LAB — BASIC METABOLIC PANEL
BUN/Creatinine Ratio: 20 (ref 12–28)
BUN: 28 mg/dL — ABNORMAL HIGH (ref 8–27)
CO2: 21 mmol/L (ref 20–29)
Calcium: 9.4 mg/dL (ref 8.7–10.3)
Chloride: 106 mmol/L (ref 96–106)
Creatinine, Ser: 1.37 mg/dL — ABNORMAL HIGH (ref 0.57–1.00)
GFR calc Af Amer: 40 mL/min/{1.73_m2} — ABNORMAL LOW (ref >59)
GFR calc non Af Amer: 34 mL/min/{1.73_m2} — ABNORMAL LOW (ref >59)
Glucose: 93 mg/dL (ref 65–99)
Potassium: 4.4 mmol/L (ref 3.5–5.2)
Sodium: 140 mmol/L (ref 134–144)

## 2020-04-19 ENCOUNTER — Ambulatory Visit: Payer: Medicare Other | Admitting: Family Medicine

## 2020-04-20 ENCOUNTER — Ambulatory Visit (INDEPENDENT_AMBULATORY_CARE_PROVIDER_SITE_OTHER): Payer: Medicare Other | Admitting: Family Medicine

## 2020-04-20 ENCOUNTER — Other Ambulatory Visit: Payer: Self-pay

## 2020-04-20 ENCOUNTER — Encounter: Payer: Self-pay | Admitting: Family Medicine

## 2020-04-20 VITALS — BP 160/82 | HR 61 | Temp 98.1°F | Wt 182.6 lb

## 2020-04-20 DIAGNOSIS — I129 Hypertensive chronic kidney disease with stage 1 through stage 4 chronic kidney disease, or unspecified chronic kidney disease: Secondary | ICD-10-CM

## 2020-04-20 NOTE — Assessment & Plan Note (Signed)
Above goal today but with normal measurements at home, no med changes today. Will check BMP today. Have patient keep log of BP and return in 2 weeks with BP log and home cuff for calibration. F/u in 2 weeks.

## 2020-04-20 NOTE — Patient Instructions (Signed)
It was great to see you!  Our plans for today:  - Keep a log of your blood pressure and bring this with you when you come. Also bring your home blood pressure cuff with you. - Come back in 2 weeks.   We are checking some labs today, we will release these results to your MyChart.  Take care and seek immediate care sooner if you develop any concerns.   Dr. Ky Barban

## 2020-04-20 NOTE — Progress Notes (Signed)
   SUBJECTIVE:   CHIEF COMPLAINT / HPI:   Hypertension: - Medications: lisinopril 40mg  (recently stopped amlodipine and HCTZ) - Compliance: good - Checking BP at home: yes, 120-130s SBP. - edema is improved - Denies any SOB, CP, vision changes, medication SEs, or symptoms of hypotension  OBJECTIVE:   BP (!) 160/82   Pulse 61   Temp 98.1 F (36.7 C) (Oral)   Wt 182 lb 9.6 oz (82.8 kg)   SpO2 98%   BMI 31.34 kg/m   Gen: elderly, in NAD Card: RRR Lungs: CTAB Ext: WWP, trace edema  ASSESSMENT/PLAN:   Benign hypertensive renal disease Above goal today but with normal measurements at home, no med changes today. Will check BMP today. Have patient keep log of BP and return in 2 weeks with BP log and home cuff for calibration. F/u in 2 weeks.     Myles Gip, DO

## 2020-04-21 LAB — BASIC METABOLIC PANEL
BUN/Creatinine Ratio: 14 (ref 12–28)
BUN: 15 mg/dL (ref 8–27)
CO2: 22 mmol/L (ref 20–29)
Calcium: 9.2 mg/dL (ref 8.7–10.3)
Chloride: 104 mmol/L (ref 96–106)
Creatinine, Ser: 1.04 mg/dL — ABNORMAL HIGH (ref 0.57–1.00)
GFR calc Af Amer: 55 mL/min/{1.73_m2} — ABNORMAL LOW (ref >59)
GFR calc non Af Amer: 48 mL/min/{1.73_m2} — ABNORMAL LOW (ref >59)
Glucose: 89 mg/dL (ref 65–99)
Potassium: 4.5 mmol/L (ref 3.5–5.2)
Sodium: 137 mmol/L (ref 134–144)

## 2020-04-22 ENCOUNTER — Other Ambulatory Visit: Payer: Self-pay

## 2020-04-22 ENCOUNTER — Emergency Department
Admission: EM | Admit: 2020-04-22 | Discharge: 2020-04-22 | Disposition: A | Discharge status: Home / Self Care | Payer: Medicare Other | Attending: Emergency Medicine | Admitting: Emergency Medicine

## 2020-04-22 ENCOUNTER — Telehealth: Payer: Self-pay | Admitting: Family Medicine

## 2020-04-22 ENCOUNTER — Emergency Department: Payer: Medicare Other

## 2020-04-22 ENCOUNTER — Ambulatory Visit: Payer: Self-pay | Admitting: *Deleted

## 2020-04-22 DIAGNOSIS — I129 Hypertensive chronic kidney disease with stage 1 through stage 4 chronic kidney disease, or unspecified chronic kidney disease: Secondary | ICD-10-CM | POA: Insufficient documentation

## 2020-04-22 DIAGNOSIS — N189 Chronic kidney disease, unspecified: Secondary | ICD-10-CM | POA: Insufficient documentation

## 2020-04-22 DIAGNOSIS — Z79899 Other long term (current) drug therapy: Secondary | ICD-10-CM | POA: Diagnosis not present

## 2020-04-22 DIAGNOSIS — R609 Edema, unspecified: Secondary | ICD-10-CM | POA: Diagnosis not present

## 2020-04-22 DIAGNOSIS — I160 Hypertensive urgency: Secondary | ICD-10-CM | POA: Insufficient documentation

## 2020-04-22 DIAGNOSIS — I10 Essential (primary) hypertension: Secondary | ICD-10-CM | POA: Diagnosis not present

## 2020-04-22 DIAGNOSIS — Z7982 Long term (current) use of aspirin: Secondary | ICD-10-CM | POA: Insufficient documentation

## 2020-04-22 DIAGNOSIS — Z87891 Personal history of nicotine dependence: Secondary | ICD-10-CM | POA: Insufficient documentation

## 2020-04-22 DIAGNOSIS — R519 Headache, unspecified: Secondary | ICD-10-CM | POA: Diagnosis not present

## 2020-04-22 DIAGNOSIS — Z85828 Personal history of other malignant neoplasm of skin: Secondary | ICD-10-CM | POA: Insufficient documentation

## 2020-04-22 DIAGNOSIS — G4489 Other headache syndrome: Secondary | ICD-10-CM | POA: Diagnosis not present

## 2020-04-22 LAB — BASIC METABOLIC PANEL
Anion gap: 11 (ref 5–15)
BUN: 16 mg/dL (ref 8–23)
CO2: 24 mmol/L (ref 22–32)
Calcium: 9.1 mg/dL (ref 8.9–10.3)
Chloride: 106 mmol/L (ref 98–111)
Creatinine, Ser: 1.24 mg/dL — ABNORMAL HIGH (ref 0.44–1.00)
GFR, Estimated: 42 mL/min — ABNORMAL LOW (ref >60)
Glucose, Bld: 100 mg/dL — ABNORMAL HIGH (ref 70–99)
Potassium: 4 mmol/L (ref 3.5–5.1)
Sodium: 141 mmol/L (ref 135–145)

## 2020-04-22 LAB — TROPONIN I (HIGH SENSITIVITY): Troponin I (High Sensitivity): 11 ng/L (ref <18)

## 2020-04-22 MED ORDER — ACETAMINOPHEN 500 MG PO TABS
1000.0000 mg | ORAL_TABLET | Freq: Once | ORAL | Status: AC
  Administered 2020-04-22: 1000 mg via ORAL
  Filled 2020-04-22: qty 2

## 2020-04-22 NOTE — Telephone Encounter (Signed)
FYI pt is calling to let dr [name] know she is going to go to [facility]  For her bp

## 2020-04-22 NOTE — ED Notes (Signed)
Pt with daughter pt is coming in with a headache and elevated blood pressure. As per daughter, home blood pressure medications have been adjusted over the last several weeks.

## 2020-04-22 NOTE — ED Triage Notes (Addendum)
Pt to ED EMS from home for chief complaint of HTN, states BP was 921 systolic this morning and was told to come to ER Alert and oriented, clear speech.  BP 150/76 in triage.  8/10 intermittent headache that started this morning Denies vision changes. Denies cp/shob

## 2020-04-22 NOTE — ED Provider Notes (Signed)
[facility] Emergency Department Provider Note  ____________________________________________   Event Date/Time   First MD Initiated Contact with Patient 04/22/20 1710     (approximate)  I have reviewed the triage vital signs and the nursing notes.   HISTORY  Chief Complaint Hypertension   HPI Faith Cox is a 87 y.o. female with a past medical history of hypertension, osteoporosis, obesity, hyperlipidemia and burning feet syndrome who presents for assessment of headache associate with elevated blood pressures that began earlier today.  Patient states she has had some blood pressures that were elevated in the 160s and 170s over the last couple of days but none as high as 200 which she measured earlier this morning.  She states she has a headache today that is different than usual.  It came on over minutes and not seconds.  It is better now than when it began.  No photophobia or phonophobia.  No recent trauma or injuries.  She endorses some chronic swelling and tingling in her bilateral lower legs but no other acute sick symptoms including vision changes, vertigo, chest pain, cough, shortness of breath, abdominal pain, back pain, focal extremity weakness numbness or other acute focal symptoms.  No rash or recent injuries.  Patient is not on any blood thinners.         Past Medical History:  Diagnosis Date  . Cancer (HCC)    skin  . Hyperlipidemia   . Hypertension     Patient Active Problem List   Diagnosis Date Noted  . Osteoporosis 10/21/2018  . Senile purpura (HCC) 06/18/2018  . Class 1 obesity due to excess calories with serious comorbidity and body mass index (BMI) of 30.0 to 30.9 in adult 06/18/2018  . Advance care planning 06/18/2018  . Knee pain 03/14/2016  . Hyperlipidemia   . Benign hypertensive renal disease   . Burning feet syndrome 05/20/2014  . CA skin, basal cell 03/15/2012    Past Surgical History:  Procedure Laterality Date  .  ABDOMINAL HYSTERECTOMY    . APPENDECTOMY    . BASAL CELL CARCINOMA EXCISION Right 12/24/2019   Right chin  . BASAL CELL CARCINOMA EXCISION Right 01/22/2020, 01/29/20   Right post auricular  . bladder repair    . BREAST EXCISIONAL BIOPSY Left 08/09/1978   neg  . GALLBLADDER SURGERY    . TOE SURGERY      Prior to Admission medications   Medication Sig Start Date End Date Taking? Authorizing Provider  aspirin 81 MG tablet Take 81 mg by mouth daily.    [provider]  Calcium Carb-Cholecalciferol [phone] MG-UNIT CAPS Take by mouth.    [provider]  IRON PO Take 500 mg by mouth daily.    [provider]  lisinopril (ZESTRIL) 40 MG tablet Take 1 tablet (40 mg total) by mouth daily. 04/05/20   Johnson, Megan P, DO  Omega-3 Fatty Acids (FISH OIL) 1000 MG CPDR Take by mouth.    [provider]  simvastatin (ZOCOR) 20 MG tablet Take 1 tablet (20 mg total) by mouth at bedtime. 12/26/19   Johnson, Megan P, DO  vitamin B-12 (CYANOCOBALAMIN) 1000 MCG tablet Take 1,000 mcg by mouth daily.    [provider]    Allergies Amlodipine  Family History  Problem Relation Age of Onset  . Pancreatic cancer Mother   . Stroke Father   . Alzheimer's disease Paternal Grandmother   . Leukemia Brother   . Breast cancer Neg Hx  Social History Social History   Tobacco Use  . Smoking status: Former Smoker    Quit date: 04/11/1979    Years since quitting: 41.0  . Smokeless tobacco: Never Used  . Tobacco comment: quit 30 years ago   Vaping Use  . Vaping Use: Never used  Substance Use Topics  . Alcohol use: Yes    Alcohol/week: 0.0 standard drinks    Comment: beer on occasion  . Drug use: No    Review of Systems  Review of Systems  Constitutional: Negative for chills and fever.  HENT: Negative for sore throat.   Eyes: Negative for pain.  Respiratory: Negative for cough and stridor.   Cardiovascular: Negative for chest pain.  Gastrointestinal:  Negative for vomiting.  Genitourinary: Negative for dysuria.  Musculoskeletal: Negative for myalgias.  Skin: Negative for rash.  Neurological: Positive for tingling ( b/l ankles) and headaches. Negative for seizures and loss of consciousness.  Psychiatric/Behavioral: Negative for suicidal ideas.  All other systems reviewed and are negative.     ____________________________________________   PHYSICAL EXAM:  VITAL SIGNS: ED Triage Vitals  Enc Vitals Group     BP 04/22/20 1603 (!) 150/76     Pulse Rate 04/22/20 1603 85     Resp 04/22/20 1603 18     Temp 04/22/20 1603 (!) 97.5 F (36.4 C)     Temp Source 04/22/20 1603 Oral     SpO2 04/22/20 1603 97 %     Weight 04/22/20 1606 182 lb (82.6 kg)     Height 04/22/20 1606 5\' 5"  (1.651 m)     Head Circumference --      Peak Flow --      Pain Score 04/22/20 1605 8     Pain Loc --      Pain Edu? --      Excl. in [place]? --    Vitals:   04/22/20 1603  BP: (!) 150/76  Pulse: 85  Resp: 18  Temp: (!) 97.5 F (36.4 C)  SpO2: 97%   Physical Exam Vitals and nursing note reviewed.  Constitutional:      General: She is not in acute distress.    Appearance: Normal appearance. She is well-developed, normal weight and well-nourished.  HENT:     Head: Normocephalic and atraumatic.     Right Ear: External ear normal.     Left Ear: External ear normal.     Nose: Nose normal.     Mouth/Throat:     Mouth: Mucous membranes are moist.  Eyes:     Conjunctiva/sclera: Conjunctivae normal.  Cardiovascular:     Rate and Rhythm: Normal rate and regular rhythm.     Heart sounds: No murmur heard.   Pulmonary:     Effort: Pulmonary effort is normal. No respiratory distress.     Breath sounds: Normal breath sounds.  Abdominal:     Palpations: Abdomen is soft.     Tenderness: There is no abdominal tenderness.  Musculoskeletal:     Cervical back: Neck supple.     Right lower leg: Edema present.     Left lower leg: Edema present.  Skin:     General: Skin is warm and dry.     Capillary Refill: Capillary refill takes less than 2 seconds.  Neurological:     Mental Status: She is alert and oriented to person, place, and time.  Psychiatric:        Mood and Affect: Mood and affect and mood normal.  Cranial nerves II through XII grossly intact.  No pronator drift.  No finger dysmetria.  Symmetric 5/5 strength of all extremities.  Sensation intact to light touch in all extremities.  Unremarkable unassisted gait.  ____________________________________________   LABS (all labs ordered are listed, but only abnormal results are displayed)  Labs Reviewed  BASIC METABOLIC PANEL - Abnormal; Notable for the following components:      Result Value   Glucose, Bld 100 (*)    Creatinine, Ser 1.24 (*)    GFR, Estimated 42 (*)    All other components within normal limits  TROPONIN I (HIGH SENSITIVITY)   ____________________________________________  EKG  ECG shows a ventricular rate of 76 with normal axis, unremarkable intervals, and some nonspecific ST changes in inferior leads versus artifact as well as anterior leads.  No recent EKGs visible to compare to. ____________________________________________  RADIOLOGY  ED MD interpretation: No evidence of acute intracranial process.  Official radiology report(s): CT Head Wo Contrast  Result Date: 04/22/2020 CLINICAL DATA:  Hypertension.  Headache. EXAM: CT HEAD WITHOUT CONTRAST TECHNIQUE: Contiguous axial images were obtained from the base of the skull through the vertex without intravenous contrast. COMPARISON:  None. FINDINGS: Brain: The brain does not show accelerated atrophy for age. No focal abnormality affects the brainstem or cerebellum. There is an old small vessel infarction in the left internal capsule. Minimal chronic small-vessel change of the hemispheric deep white matter. No other focal brain finding. No mass, hemorrhage, hydrocephalus or extra-axial collection. Vascular:  There is atherosclerotic calcification of the major vessels at the base of the brain. Skull: Normal Sinuses/Orbits: Clear/normal Other: None IMPRESSION: No acute finding by CT. Old small vessel infarction in the left internal capsule. Minimal chronic small-vessel change of the hemispheric deep white matter. Electronically Signed   By: Nelson Chimes M.D.   On: 04/22/2020 17:37    ____________________________________________   PROCEDURES  Procedure(s) performed (including Critical Care):  .1-3 Lead EKG Interpretation Performed by: Lucrezia Starch, MD Authorized by: Lucrezia Starch, MD     Interpretation: normal     ECG rate assessment: normal     Rhythm: sinus rhythm     Ectopy: none     Conduction: normal       ____________________________________________   INITIAL IMPRESSION / ASSESSMENT AND PLAN / ED COURSE      Patient presents for assessment of headache she experienced associate with elevated blood pressures near systolic of 034 earlier today.  Patient's headache and blood pressure has subsequently improved without any medication and on arrival her BP is 150/76 and she states her headache is much better.  He does have a nonfocal neuro exam.  Low suspicion for CVA and there is no evidence of SAH or other acute process on head CT.  Patient denies any chest pain normal EKG has some nonspecific ST changes low suspicion for ACS given nonelevated troponin and patient denying any chest pain at this time.  No historical or exam findings suggest dissection.  BMP shows unremarkable electrolytes with no significant metabolic derangements.  Kidney function is at baseline.  Given improvement in blood pressure and headache without any interventions and otherwise reassuring exam and work-up believe patient is safe to discharge plan for close outpatient PCP follow-up for likely further titration of BP medications.  Discharge stable condition.  Return precautions advised and discussed.     ____________________________________________   FINAL CLINICAL IMPRESSION(S) / ED DIAGNOSES  Final diagnoses:  Hypertensive urgency    Medications  acetaminophen (TYLENOL) tablet 1,000 mg (has no administration in time range)     ED Discharge Orders    None       Note:  This document was prepared using Dragon voice recognition software and may include unintentional dictation errors.   Lucrezia Starch, MD 04/22/20 [phone]

## 2020-04-22 NOTE — Telephone Encounter (Signed)
Patient is calling to report she has elevated BP- patient reports this morning she has tingling-numbness feelings bilateral ankles- like they are trying to swell. Patient reports no other symptoms at all- call to office to see if she can be seen before sending to ED for BP- request notes for provider review and call back. Contact number for patient is correct.  Reason for Disposition . [7] Systolic BP  >= 628 OR Diastolic >= 315 AND [1] cardiac or neurologic symptoms (e.g., chest pain, difficulty breathing, unsteady gait, blurred vision)    Only symptom- tingling/numbness in both ankle like they are trying to swell- call to office to see if she can be seen. Request notes.  Answer Assessment - Initial Assessment Questions 1. BLOOD PRESSURE: "What is the blood pressure?" "Did you take at least two measurements 5 minutes apart?"     195/84, 208/81, 188/73 P74, 213/86 2. ONSET: "When did you take your blood pressure?"     9am, 10am, 10:15am 3. HOW: "How did you obtain the blood pressure?" (e.g., visiting nurse, automatic home BP monitor)     Automatic cuff- arm 4. HISTORY: "Do you have a history of high blood pressure?"     yes 5. MEDICATIONS: "Are you taking any medications for blood pressure?" "Have you missed any doses recently?"     Yes- no missed doses 6. OTHER SYMPTOMS: "Do you have any symptoms?" (e.g., headache, chest pain, blurred vision, difficulty breathing, weakness)     Numbness at ankles- tingling like they are going to swell 7. PREGNANCY: "Is there any chance you are pregnant?" "When was your last menstrual period?"     n/a  Protocols used: BLOOD PRESSURE - HIGH-A-AH

## 2020-04-22 NOTE — Telephone Encounter (Signed)
appt

## 2020-04-22 NOTE — Telephone Encounter (Signed)
Please check to see if she's taken her medicine today and have her recheck her blood pressure. Thanks.

## 2020-04-22 NOTE — Telephone Encounter (Signed)
Patient states she takes her BP medicine at home she is taking the lisinopril, did take it last night. BP this morning was 195/89, 10:00 am 208/81, 12:00 pm 189/79, current BP is 211/104. Patient states she still feels numbness and tingling in her ankles and they appear to be swelling.

## 2020-04-22 NOTE — Telephone Encounter (Signed)
Pt is at [facility]

## 2020-04-22 NOTE — ED Notes (Signed)
First RN note:  Pt comes into the ED via [facility] from home c/o HTN.  Pt took BP at home and it was 628 systolic.  EMS got 198/82.  Recent BP medication changed to lisinopril.  Pt also has swelling noted to the ankles.

## 2020-04-23 ENCOUNTER — Telehealth: Payer: Self-pay

## 2020-04-23 NOTE — Telephone Encounter (Signed)
Appt Monday please. OK to take medicine either in AM or PM- same time is what matters.

## 2020-04-23 NOTE — Telephone Encounter (Signed)
Copied from [place] [phone] [phone]. Topic: General - Other >> Apr 23, 2020  8:23 AM Leward Quan A wrote: Reason for CRM: Patient called in to inform Dr [name] that she was seen in the ER on 04/22/20 and asked to call her Dr. Patient asking for a call back from nurse to discuss what's going on with her. Please call Ph# [phone]   Called pt back for more information, Pt stated she went to the ER due to BP issues and was feeling bad had CT scan and EKG and took her blood work and stated they said everything was stable and pt stated  189/99 has question if she should take her medication asked if better in the morning or afternoon.  S.Terence Bart

## 2020-04-23 NOTE — Telephone Encounter (Signed)
Pt scheduled for 04/27/2020 pt verbalized understanding.

## 2020-04-26 ENCOUNTER — Telehealth: Payer: Self-pay

## 2020-04-26 NOTE — Telephone Encounter (Signed)
lvm we had to cancel apt due to provider not in office if calls back please reshedule Dr. Rumball will not be in the office.  

## 2020-04-27 ENCOUNTER — Ambulatory Visit: Payer: Medicare Other | Admitting: Family Medicine

## 2020-04-28 ENCOUNTER — Other Ambulatory Visit: Payer: Self-pay

## 2020-04-28 ENCOUNTER — Ambulatory Visit (INDEPENDENT_AMBULATORY_CARE_PROVIDER_SITE_OTHER): Payer: Medicare Other | Admitting: Family Medicine

## 2020-04-28 ENCOUNTER — Encounter: Payer: Self-pay | Admitting: Family Medicine

## 2020-04-28 VITALS — BP 190/88 | HR 68 | Temp 98.3°F | Wt 179.5 lb

## 2020-04-28 DIAGNOSIS — I129 Hypertensive chronic kidney disease with stage 1 through stage 4 chronic kidney disease, or unspecified chronic kidney disease: Secondary | ICD-10-CM | POA: Diagnosis not present

## 2020-04-28 MED ORDER — INDAPAMIDE 1.25 MG PO TABS: 1.2500 mg | ORAL_TABLET | Freq: Every day | ORAL | 0 refills | Status: DC

## 2020-04-28 NOTE — Patient Instructions (Signed)
It was great to see you!  Our plans for today:  - Take the indapamide in the morning. Continue to take the lisinopril as you have been, at night. - Elevate your legs when sitting. - Come back in 1 week.   Take care and seek immediate care sooner if you develop any concerns.   Dr. Ky Barban

## 2020-04-28 NOTE — Assessment & Plan Note (Signed)
Remains uncontrolled with recent hypertensive urgency. Will start indapamide in am, continue nightly lisinopril. Plan to obtain BMP at next visit.

## 2020-04-28 NOTE — Progress Notes (Signed)
   SUBJECTIVE:   CHIEF COMPLAINT / HPI:   Patient Active Problem List   Diagnosis Date Noted  . Osteoporosis 10/21/2018  . Senile purpura ([place]) 06/18/2018  . Class 1 obesity due to excess calories with serious comorbidity and body mass index (BMI) of 30.0 to 30.9 in adult 06/18/2018  . Advance care planning 06/18/2018  . Knee pain 03/14/2016  . Hyperlipidemia   . Benign hypertensive renal disease   . Burning feet syndrome 05/20/2014  . CA skin, basal cell 03/15/2012   ER FOLLOW UP Time since discharge: 6 days Hospital/facility: [facility] Diagnosis: Hypertensive urgency Procedures/tests:  - EKG no change - CT Head NAICA - relief of headache with tylenol Consultants: none New medications: none  Discharge instructions: f/u with PCP   Status: same - no missed doses of lisinopril   - BP in 140-180s at home.  - occasional headache relieved with tylenol. Still with some trace edema. - Denies vision changes, chest pain.   OBJECTIVE:   BP (!) 190/88   Pulse 68   Temp 98.3 F (36.8 C)   Wt 179 lb 8 oz (81.4 kg)   SpO2 97%   BMI 29.87 kg/m   Gen: well appearing, in NAD Card: reg rate Lungs: comfortable WOB on RA Ext: WWP, trace edema  ASSESSMENT/PLAN:   Benign hypertensive renal disease Remains uncontrolled with recent hypertensive urgency. Will start indapamide in am, continue nightly lisinopril. Plan to obtain BMP at next visit.     Myles Gip, DO

## 2020-05-05 ENCOUNTER — Other Ambulatory Visit: Payer: Self-pay

## 2020-05-05 ENCOUNTER — Ambulatory Visit (INDEPENDENT_AMBULATORY_CARE_PROVIDER_SITE_OTHER): Payer: Medicare Other | Admitting: Family Medicine

## 2020-05-05 ENCOUNTER — Encounter: Payer: Self-pay | Admitting: Family Medicine

## 2020-05-05 VITALS — BP 190/75 | HR 66 | Temp 97.9°F | Wt 179.0 lb

## 2020-05-05 DIAGNOSIS — I129 Hypertensive chronic kidney disease with stage 1 through stage 4 chronic kidney disease, or unspecified chronic kidney disease: Secondary | ICD-10-CM

## 2020-05-05 MED ORDER — INDAPAMIDE 2.5 MG PO TABS: 2.5000 mg | ORAL_TABLET | Freq: Every day | ORAL | 3 refills | Status: DC

## 2020-05-05 NOTE — Assessment & Plan Note (Signed)
Still running high. Will have her increase her idapamide to 2.5mg  and recheck 2 weeks. Checking BMP today. Continue to monitor. Call with any concerns.

## 2020-05-05 NOTE — Progress Notes (Signed)
BP (!) 190/75   Pulse 66   Temp 97.9 F (36.6 C)   Wt 179 lb (81.2 kg)   SpO2 98%   BMI 29.79 kg/m    Subjective:    Patient ID: Faith Cox, female    DOB: 1929-10-30, 87 y.o.   MRN: [id number]  HPI: Faith Cox is a 87 y.o. female  Chief Complaint  Patient presents with  . Hypertension   HYPERTENSION Hypertension status: uncontrolled  Satisfied with current treatment? yes Duration of hypertension: chronic BP monitoring frequency:  daily BP range: 150s-170s.50-80s BP medication side effects:  no Medication compliance: excellent compliance Previous BP meds: lisinopril, amlodipine, HCTZ, indapamine Aspirin: yes Recurrent headaches: yes Visual changes: no Palpitations: no Dyspnea: no Chest pain: no Lower extremity edema: no Dizzy/lightheaded: no  Relevant past medical, surgical, family and social history reviewed and updated as indicated. Interim medical history since our last visit reviewed. Allergies and medications reviewed and updated.  Review of Systems  Constitutional: Negative.   Respiratory: Negative.   Cardiovascular: Negative.   Gastrointestinal: Negative.   Musculoskeletal: Negative.   Neurological: Negative.   Psychiatric/Behavioral: Negative.     Per HPI unless specifically indicated above     Objective:    BP (!) 190/75   Pulse 66   Temp 97.9 F (36.6 C)   Wt 179 lb (81.2 kg)   SpO2 98%   BMI 29.79 kg/m   Wt Readings from Last 3 Encounters:  05/05/20 179 lb (81.2 kg)  04/28/20 179 lb 8 oz (81.4 kg)  04/22/20 182 lb (82.6 kg)    Physical Exam Vitals and nursing note reviewed.  Constitutional:      General: She is not in acute distress.    Appearance: Normal appearance. She is not ill-appearing, toxic-appearing or diaphoretic.  HENT:     Head: Normocephalic and atraumatic.     Right Ear: External ear normal.     Left Ear: External ear normal.     Nose: Nose normal.     Mouth/Throat:     Mouth: Mucous membranes are moist.      Pharynx: Oropharynx is clear.  Eyes:     General: No scleral icterus.       Right eye: No discharge.        Left eye: No discharge.     Extraocular Movements: Extraocular movements intact.     Conjunctiva/sclera: Conjunctivae normal.     Pupils: Pupils are equal, round, and reactive to light.  Cardiovascular:     Rate and Rhythm: Normal rate and regular rhythm.     Pulses: Normal pulses.     Heart sounds: Normal heart sounds. No murmur heard. No friction rub. No gallop.   Pulmonary:     Effort: Pulmonary effort is normal. No respiratory distress.     Breath sounds: Normal breath sounds. No stridor. No wheezing, rhonchi or rales.  Chest:     Chest wall: No tenderness.  Musculoskeletal:        General: Normal range of motion.     Cervical back: Normal range of motion and neck supple.  Skin:    General: Skin is warm and dry.     Capillary Refill: Capillary refill takes less than 2 seconds.     Coloration: Skin is not jaundiced or pale.     Findings: No bruising, erythema, lesion or rash.  Neurological:     General: No focal deficit present.     Mental Status: She is alert and  oriented to person, place, and time. Mental status is at baseline.  Psychiatric:        Mood and Affect: Mood normal.        Behavior: Behavior normal.        Thought Content: Thought content normal.        Judgment: Judgment normal.     Results for orders placed or performed during the hospital encounter of 56/31/49  Basic metabolic panel  Result Value Ref Range   Sodium 141 135 - 145 mmol/L   Potassium 4.0 3.5 - 5.1 mmol/L   Chloride 106 98 - 111 mmol/L   CO2 24 22 - 32 mmol/L   Glucose, Bld 100 (H) 70 - 99 mg/dL   BUN 16 8 - 23 mg/dL   Creatinine, Ser 1.24 (H) 0.44 - 1.00 mg/dL   Calcium 9.1 8.9 - 10.3 mg/dL   GFR, Estimated 42 (L) >60 mL/min   Anion gap 11 5 - 15  Troponin I (High Sensitivity)  Result Value Ref Range   Troponin I (High Sensitivity) 11 <18 ng/L      Assessment & Plan:    Problem List Items Addressed This Visit      Genitourinary   Benign hypertensive renal disease - Primary    Still running high. Will have her increase her idapamide to 2.5mg  and recheck 2 weeks. Checking BMP today. Continue to monitor. Call with any concerns.       Relevant Orders   Basic metabolic panel       Follow up plan: Return in about 2 weeks (around 05/19/2020).

## 2020-05-06 LAB — BASIC METABOLIC PANEL
BUN/Creatinine Ratio: 19 (ref 12–28)
BUN: 21 mg/dL (ref 8–27)
CO2: 20 mmol/L (ref 20–29)
Calcium: 9.7 mg/dL (ref 8.7–10.3)
Chloride: 103 mmol/L (ref 96–106)
Creatinine, Ser: 1.11 mg/dL — ABNORMAL HIGH (ref 0.57–1.00)
GFR calc Af Amer: 51 mL/min/{1.73_m2} — ABNORMAL LOW (ref >59)
GFR calc non Af Amer: 44 mL/min/{1.73_m2} — ABNORMAL LOW (ref >59)
Glucose: 105 mg/dL — ABNORMAL HIGH (ref 65–99)
Potassium: 4.6 mmol/L (ref 3.5–5.2)
Sodium: 138 mmol/L (ref 134–144)

## 2020-05-06 NOTE — Progress Notes (Signed)
Patient notified

## 2020-05-13 ENCOUNTER — Telehealth: Payer: Self-pay | Admitting: Family Medicine

## 2020-05-13 ENCOUNTER — Ambulatory Visit: Payer: Self-pay | Admitting: *Deleted

## 2020-05-13 ENCOUNTER — Encounter: Payer: Self-pay | Admitting: Family Medicine

## 2020-05-13 ENCOUNTER — Ambulatory Visit (INDEPENDENT_AMBULATORY_CARE_PROVIDER_SITE_OTHER): Payer: Medicare Other | Admitting: Family Medicine

## 2020-05-13 VITALS — BP 174/99 | HR 51 | Temp 98.9°F | Wt 178.0 lb

## 2020-05-13 DIAGNOSIS — Z20822 Contact with and (suspected) exposure to covid-19: Secondary | ICD-10-CM | POA: Diagnosis not present

## 2020-05-13 DIAGNOSIS — J209 Acute bronchitis, unspecified: Secondary | ICD-10-CM

## 2020-05-13 MED ORDER — HYDROCOD POLST-CPM POLST ER 10-8 MG/5ML PO SUER: 5.0000 mL | Freq: Two times a day (BID) | ORAL | 0 refills | Status: DC | PRN

## 2020-05-13 MED ORDER — BENZONATATE 200 MG PO CAPS: 200.0000 mg | ORAL_CAPSULE | Freq: Two times a day (BID) | ORAL | 0 refills | Status: DC | PRN

## 2020-05-13 MED ORDER — PREDNISONE 10 MG PO TABS: ORAL_TABLET | ORAL | 0 refills | Status: DC

## 2020-05-13 NOTE — Progress Notes (Signed)
BP (!) 174/99   Pulse (!) 51   Temp 98.9 F (37.2 C)   Wt 178 lb (80.7 kg)   SpO2 95%   BMI 29.62 kg/m    Subjective:    Patient ID: Faith Cox, female    DOB: 10-21-1929, 87 y.o.   MRN: [id number]  HPI: Faith Cox is a 87 y.o. female  Chief Complaint  Patient presents with  . Cough    Patient states she has a cough for about a week. Taking mucinex and delsym otc doesn't seem to be helping.    UPPER RESPIRATORY TRACT INFECTION Duration: about a week Worst symptom: cough Fever: no Cough: yes Shortness of breath: no Wheezing: no Chest pain: no Chest tightness: yes Chest congestion: no Nasal congestion: yes Runny nose: yes Post nasal drip: yes Sneezing: no Sore throat: no Swollen glands: no Sinus pressure: no Headache: yes Face pain: no Toothache: no Ear pain: no  Ear pressure: no  Eyes red/itching:no Eye drainage/crusting: no  Vomiting: no Rash: no Fatigue: yes Sick contacts: no Strep contacts: no  Context: worse Recurrent sinusitis: no Relief with OTC cold/cough medications: no  Treatments attempted: mucinex and cough syrup    Relevant past medical, surgical, family and social history reviewed and updated as indicated. Interim medical history since our last visit reviewed. Allergies and medications reviewed and updated.  Review of Systems  Constitutional: Positive for fatigue. Negative for activity change, appetite change, chills, diaphoresis, fever and unexpected weight change.  HENT: Positive for congestion, postnasal drip and rhinorrhea. Negative for dental problem, drooling, ear discharge, ear pain, facial swelling, hearing loss, mouth sores, nosebleeds, sinus pressure, sinus pain, sneezing, sore throat, tinnitus, trouble swallowing and voice change.   Respiratory: Positive for cough and chest tightness. Negative for apnea, choking, shortness of breath, wheezing and stridor.   Cardiovascular: Negative.   Gastrointestinal: Negative.    Psychiatric/Behavioral: Negative.     Per HPI unless specifically indicated above     Objective:    BP (!) 174/99   Pulse (!) 51   Temp 98.9 F (37.2 C)   Wt 178 lb (80.7 kg)   SpO2 95%   BMI 29.62 kg/m   Wt Readings from Last 3 Encounters:  05/13/20 178 lb (80.7 kg)  05/05/20 179 lb (81.2 kg)  04/28/20 179 lb 8 oz (81.4 kg)    Physical Exam Vitals and nursing note reviewed.  Pulmonary:     Effort: Pulmonary effort is normal. No respiratory distress.     Comments: Speaking in full sentences Neurological:     Mental Status: She is alert.  Psychiatric:        Mood and Affect: Mood normal.        Behavior: Behavior normal.        Thought Content: Thought content normal.        Judgment: Judgment normal.     Results for orders placed or performed in visit on 58/52/77  Basic metabolic panel  Result Value Ref Range   Glucose 105 (H) 65 - 99 mg/dL   BUN 21 8 - 27 mg/dL   Creatinine, Ser 1.11 (H) 0.57 - 1.00 mg/dL   GFR calc non Af Amer 44 (L) >59 mL/min/1.73   GFR calc Af Amer 51 (L) >59 mL/min/1.73   BUN/Creatinine Ratio 19 12 - 28   Sodium 138 134 - 144 mmol/L   Potassium 4.6 3.5 - 5.2 mmol/L   Chloride 103 96 - 106 mmol/L   CO2 20  20 - 29 mmol/L   Calcium 9.7 8.7 - 10.3 mg/dL      Assessment & Plan:   Problem List Items Addressed This Visit   None   Visit Diagnoses    Suspected COVID-19 virus infection    -  Primary   Will get her swabbed. Self-quarantine until results are back. Call with any concerns.    Relevant Orders   Novel Coronavirus, NAA ([facility])   Acute bronchitis, unspecified organism       Will treat with prednisone, tessalon and tussionex. Call if not better by Monday and consider antibiotic. Call with any concerns. Continue to monitor.        Follow up plan: Return As schedueld.    . This visit was completed via telephone due to the restrictions of the COVID-19 pandemic. All issues as above were discussed and addressed but no  physical exam was performed. If it was felt that the patient should be evaluated in the office, they were directed there. The patient verbally consented to this visit. Patient was unable to complete an audio/visual visit due to Lack of equipment. Due to the catastrophic nature of the COVID-19 pandemic, this visit was done through audio contact only. . Location of the patient: home . Location of the provider: work . Those involved with this call:  . Provider: Park Liter, DO . CMA: Louanna Raw, [place] . Front Desk/Registration: Jill Side  . Time spent on call: 21 minutes on the phone discussing health concerns. 30 minutes total spent in review of patient's record and preparation of their chart.

## 2020-05-13 NOTE — Telephone Encounter (Signed)
Pt has a cough with mucus and has been taking mussinex but pt thinks it is making her BP high /for the last two days her BP has been 173 and 175 /Pt asked for the nurse to call her and if something can be called in for her coughing spells/ please advise

## 2020-05-13 NOTE — Telephone Encounter (Signed)
Scheduled virtual today

## 2020-05-13 NOTE — Telephone Encounter (Cosign Needed)
Pt called stating she has been taking mucinex and she thinks it has has 175/82 on 05/10/20; 173/73 on 05/11/20;; she has taken 5 doses of mucinex; she has also taken Delsym every 12 hours; the pt also says she has a worsening productive cough that started in her chest but has now gone to her head; she started feeling unwell on 05/10/20; recommendations made per nurse triage protocol; decision tree completed;  she sees Dr [name], [facility]; spoke with Laural Roes regarding scheduling pt; per Laural Roes pt offered and accepted telephone visit with Dr [name]  05/13/20 at 1000; the pt verbalized understanding.  Reason for Disposition . [1] Continuous (nonstop) coughing interferes with work or school AND [2] no improvement using cough treatment per Care Advice  Answer Assessment - Initial Assessment Questions 1. ONSET: "When did the cough begin?"      05/07/20 2. SEVERITY: "How bad is the cough today?"     severe 3. SPUTUM: "Describe the color of your sputum" (none, dry cough; clear, white, yellow, green)   clear 4. HEMOPTYSIS: "Are you coughing up any blood?" If so ask: "How much?" (flecks, streaks, tablespoons, etc.)    no 5. DIFFICULTY BREATHING: "Are you having difficulty breathing?" If Yes, ask: "How bad is it?" (e.g., mild, moderate, severe)    - MILD: No SOB at rest, mild SOB with walking, speaks normally in sentences, can lay down, no retractions, pulse < 100.    - MODERATE: SOB at rest, SOB with minimal exertion and prefers to sit, cannot lie down flat, speaks in phrases, mild retractions, audible wheezing, pulse 100-120.    - SEVERE: Very SOB at rest, speaks in single words, struggling to breathe, sitting hunched forward, retractions, pulse > 120      mild 6. FEVER: "Do you have a fever?" If Yes, ask: "What is your temperature, how was it measured, and when did it start?"    no 7. CARDIAC HISTORY: "Do you have any history of heart disease?" (e.g., heart attack, congestive heart failure)       htn 8. LUNG HISTORY: "Do you have any history of lung disease?"  (e.g., pulmonary embolus, asthma, emphysema)     History pneumonia 9. PE RISK FACTORS: "Do you have a history of blood clots?" (or: recent major surgery, recent prolonged travel, bedridden)     no 10. OTHER SYMPTOMS: "Do you have any other symptoms?" (e.g., runny nose, wheezing, chest pain)       Sneezing, runny nose; increased BP 175/82 on 05/10/20 and 173/73 on 05/12/20 11. PREGNANCY: "Is there any chance you are pregnant?" "When was your last menstrual period?"      no 12. TRAVEL: "Have you traveled out of the country in the last month?" (e.g., travel history, exposures)     no  Protocols used: [place]

## 2020-05-13 NOTE — Addendum Note (Signed)
Addended by: Georgina Peer on: 05/13/2020 04:01 PM   Modules accepted: Orders

## 2020-05-15 LAB — NOVEL CORONAVIRUS, NAA: SARS-CoV-2, NAA: NOT DETECTED

## 2020-05-15 LAB — SARS-COV-2, NAA 2 DAY TAT

## 2020-05-21 ENCOUNTER — Encounter: Payer: Self-pay | Admitting: Family Medicine

## 2020-05-21 ENCOUNTER — Ambulatory Visit: Payer: Medicare Other | Admitting: Family Medicine

## 2020-05-21 ENCOUNTER — Other Ambulatory Visit: Payer: Self-pay

## 2020-05-21 VITALS — BP 186/73 | HR 69 | Temp 97.7°F | Wt 177.0 lb

## 2020-05-21 DIAGNOSIS — J209 Acute bronchitis, unspecified: Secondary | ICD-10-CM | POA: Diagnosis not present

## 2020-05-21 DIAGNOSIS — I129 Hypertensive chronic kidney disease with stage 1 through stage 4 chronic kidney disease, or unspecified chronic kidney disease: Secondary | ICD-10-CM | POA: Diagnosis not present

## 2020-05-21 MED ORDER — INDAPAMIDE 2.5 MG PO TABS: 2.5000 mg | ORAL_TABLET | Freq: Every day | ORAL | 1 refills | Status: DC

## 2020-05-21 MED ORDER — HYDRALAZINE HCL 25 MG PO TABS: 25.0000 mg | ORAL_TABLET | Freq: Two times a day (BID) | ORAL | 1 refills | Status: DC

## 2020-05-21 MED ORDER — SIMVASTATIN 20 MG PO TABS: 20.0000 mg | ORAL_TABLET | Freq: Every day | ORAL | 1 refills | Status: DC

## 2020-05-21 NOTE — Progress Notes (Signed)
BP (!) 186/73 (BP Location: Right Arm, Cuff Size: Normal)   Pulse 69   Temp 97.7 F (36.5 C) (Oral)   Wt 177 lb (80.3 kg)   SpO2 98%   BMI 29.45 kg/m    Subjective:    Patient ID: Faith Cox, female    DOB: Aug 07, 1929, 87 y.o.   MRN: [id number]  HPI: Faith Cox is a 87 y.o. female  Chief Complaint  Patient presents with  . Follow-up    Pt states she is feeling a lot better since medication prescribed for cold, pt states her BP is still elevated. Pt has BP readings with her today at visit.    Feeling better from her cold. No fever, stuff in her chest is breaking up, still coughing a little bit. Still has some congestion in her chest. Sleeping OK.   Relevant past medical, surgical, family and social history reviewed and updated as indicated. Interim medical history since our last visit reviewed. Allergies and medications reviewed and updated.  Review of Systems  Constitutional: Negative.   HENT: Negative.   Respiratory: Positive for cough. Negative for apnea, choking, chest tightness, shortness of breath, wheezing and stridor.   Cardiovascular: Negative.   Gastrointestinal: Negative.   Psychiatric/Behavioral: Negative.     Per HPI unless specifically indicated above     Objective:    BP (!) 186/73 (BP Location: Right Arm, Cuff Size: Normal)   Pulse 69   Temp 97.7 F (36.5 C) (Oral)   Wt 177 lb (80.3 kg)   SpO2 98%   BMI 29.45 kg/m   Wt Readings from Last 3 Encounters:  05/21/20 177 lb (80.3 kg)  05/13/20 178 lb (80.7 kg)  05/05/20 179 lb (81.2 kg)    Physical Exam Vitals and nursing note reviewed.  Constitutional:      General: She is not in acute distress.    Appearance: Normal appearance. She is not ill-appearing, toxic-appearing or diaphoretic.  HENT:     Head: Normocephalic and atraumatic.     Right Ear: External ear normal.     Left Ear: External ear normal.     Nose: Nose normal.     Mouth/Throat:     Mouth: Mucous membranes are moist.      Pharynx: Oropharynx is clear.  Eyes:     General: No scleral icterus.       Right eye: No discharge.        Left eye: No discharge.     Extraocular Movements: Extraocular movements intact.     Conjunctiva/sclera: Conjunctivae normal.     Pupils: Pupils are equal, round, and reactive to light.  Cardiovascular:     Rate and Rhythm: Normal rate and regular rhythm.     Pulses: Normal pulses.     Heart sounds: Normal heart sounds. No murmur heard. No friction rub. No gallop.   Pulmonary:     Effort: Pulmonary effort is normal. No respiratory distress.     Breath sounds: No stridor. Wheezing (bases bilaterally) present. No rhonchi or rales.  Chest:     Chest wall: No tenderness.  Musculoskeletal:        General: Normal range of motion.     Cervical back: Normal range of motion and neck supple.  Skin:    General: Skin is warm and dry.     Capillary Refill: Capillary refill takes less than 2 seconds.     Coloration: Skin is not jaundiced or pale.     Findings: No  bruising, erythema, lesion or rash.  Neurological:     General: No focal deficit present.     Mental Status: She is alert and oriented to person, place, and time. Mental status is at baseline.  Psychiatric:        Mood and Affect: Mood normal.        Behavior: Behavior normal.        Thought Content: Thought content normal.        Judgment: Judgment normal.     Results for orders placed or performed in visit on 05/13/20  Novel Coronavirus, NAA ([facility])   Specimen: Saline  Result Value Ref Range   SARS-CoV-2, NAA Not Detected Not Detected  SARS-COV-2, NAA 2 DAY TAT  Result Value Ref Range   SARS-CoV-2, NAA 2 DAY TAT Performed       Assessment & Plan:   Problem List Items Addressed This Visit      Genitourinary   Benign hypertensive renal disease - Primary    Not under good control. Will continue current regimen and add hydralazine. Recheck 2 weeks. Call with any concerns.        Other Visit Diagnoses     Acute bronchitis, unspecified organism       Resolving but has wheezes at bilateral bases. Will check CXR. Treat as needed.   Relevant Orders   DG Chest 2 View       Follow up plan: Return in about 2 weeks (around 06/04/2020).

## 2020-05-21 NOTE — Assessment & Plan Note (Signed)
Not under good control. Will continue current regimen and add hydralazine. Recheck 2 weeks. Call with any concerns.

## 2020-05-24 ENCOUNTER — Ambulatory Visit
Admission: RE | Admit: 2020-05-24 | Discharge: 2020-05-24 | Disposition: A | Discharge status: Home / Self Care | Payer: Medicare Other | Source: Ambulatory Visit | Attending: Family Medicine | Admitting: Family Medicine

## 2020-05-24 ENCOUNTER — Other Ambulatory Visit: Payer: Self-pay

## 2020-05-24 DIAGNOSIS — R059 Cough, unspecified: Secondary | ICD-10-CM | POA: Diagnosis not present

## 2020-05-24 DIAGNOSIS — J209 Acute bronchitis, unspecified: Secondary | ICD-10-CM | POA: Diagnosis not present

## 2020-05-24 DIAGNOSIS — J9811 Atelectasis: Secondary | ICD-10-CM | POA: Diagnosis not present

## 2020-05-24 DIAGNOSIS — J439 Emphysema, unspecified: Secondary | ICD-10-CM | POA: Diagnosis not present

## 2020-05-24 DIAGNOSIS — R062 Wheezing: Secondary | ICD-10-CM | POA: Diagnosis not present

## 2020-05-31 DIAGNOSIS — D2262 Melanocytic nevi of left upper limb, including shoulder: Secondary | ICD-10-CM | POA: Diagnosis not present

## 2020-05-31 DIAGNOSIS — Z85828 Personal history of other malignant neoplasm of skin: Secondary | ICD-10-CM | POA: Diagnosis not present

## 2020-05-31 DIAGNOSIS — D0462 Carcinoma in situ of skin of left upper limb, including shoulder: Secondary | ICD-10-CM | POA: Diagnosis not present

## 2020-05-31 DIAGNOSIS — C44319 Basal cell carcinoma of skin of other parts of face: Secondary | ICD-10-CM | POA: Diagnosis not present

## 2020-05-31 DIAGNOSIS — C44311 Basal cell carcinoma of skin of nose: Secondary | ICD-10-CM | POA: Diagnosis not present

## 2020-05-31 DIAGNOSIS — D2261 Melanocytic nevi of right upper limb, including shoulder: Secondary | ICD-10-CM | POA: Diagnosis not present

## 2020-05-31 DIAGNOSIS — D2272 Melanocytic nevi of left lower limb, including hip: Secondary | ICD-10-CM | POA: Diagnosis not present

## 2020-06-04 ENCOUNTER — Other Ambulatory Visit: Payer: Self-pay

## 2020-06-04 ENCOUNTER — Encounter: Payer: Self-pay | Admitting: Family Medicine

## 2020-06-04 ENCOUNTER — Ambulatory Visit: Payer: Medicare Other | Admitting: Family Medicine

## 2020-06-04 VITALS — BP 135/73 | HR 88 | Temp 97.9°F | Wt 179.0 lb

## 2020-06-04 DIAGNOSIS — I129 Hypertensive chronic kidney disease with stage 1 through stage 4 chronic kidney disease, or unspecified chronic kidney disease: Secondary | ICD-10-CM | POA: Diagnosis not present

## 2020-06-04 NOTE — Assessment & Plan Note (Signed)
Under good control on current regimen. Continue current regimen. Continue to monitor. Call with any concerns. Refills up to date. Labs drawn today.  

## 2020-06-04 NOTE — Progress Notes (Signed)
BP 135/73   Pulse 88   Temp 97.9 F (36.6 C)   Wt 179 lb (81.2 kg)   SpO2 98%   BMI 29.79 kg/m    Subjective:    Patient ID: Faith Cox, female    DOB: 21-Oct-1929, 87 y.o.   MRN: [id number]  HPI: Faith Cox is a 87 y.o. female  Chief Complaint  Patient presents with  . Hypertension   HYPERTENSION Hypertension status: controlled  Satisfied with current treatment? yes Duration of hypertension: chronic BP monitoring frequency:  daily BP range: 120s-140s/60s-80s BP medication side effects:  no Medication compliance: excellent compliance Previous BP meds: lisinopril, indapamide, hydralazine Aspirin: yes Recurrent headaches: no Visual changes: no Palpitations: no Dyspnea: no Chest pain: no Lower extremity edema: no Dizzy/lightheaded: no   Relevant past medical, surgical, family and social history reviewed and updated as indicated. Interim medical history since our last visit reviewed. Allergies and medications reviewed and updated.  Review of Systems  Constitutional: Negative.   Respiratory: Negative.   Cardiovascular: Negative.   Gastrointestinal: Negative.   Psychiatric/Behavioral: Negative.     Per HPI unless specifically indicated above     Objective:    BP 135/73   Pulse 88   Temp 97.9 F (36.6 C)   Wt 179 lb (81.2 kg)   SpO2 98%   BMI 29.79 kg/m   Wt Readings from Last 3 Encounters:  06/04/20 179 lb (81.2 kg)  05/21/20 177 lb (80.3 kg)  05/13/20 178 lb (80.7 kg)    Physical Exam Vitals and nursing note reviewed.  Constitutional:      General: She is not in acute distress.    Appearance: Normal appearance. She is not ill-appearing, toxic-appearing or diaphoretic.  HENT:     Head: Normocephalic and atraumatic.     Right Ear: External ear normal.     Left Ear: External ear normal.     Nose: Nose normal.     Mouth/Throat:     Mouth: Mucous membranes are moist.     Pharynx: Oropharynx is clear.  Eyes:     General: No scleral  icterus.       Right eye: No discharge.        Left eye: No discharge.     Extraocular Movements: Extraocular movements intact.     Conjunctiva/sclera: Conjunctivae normal.     Pupils: Pupils are equal, round, and reactive to light.  Cardiovascular:     Rate and Rhythm: Normal rate and regular rhythm.     Pulses: Normal pulses.     Heart sounds: Normal heart sounds. No murmur heard. No friction rub. No gallop.   Pulmonary:     Effort: Pulmonary effort is normal. No respiratory distress.     Breath sounds: Normal breath sounds. No stridor. No wheezing, rhonchi or rales.  Chest:     Chest wall: No tenderness.  Musculoskeletal:        General: Normal range of motion.     Cervical back: Normal range of motion and neck supple.  Skin:    General: Skin is warm and dry.     Capillary Refill: Capillary refill takes less than 2 seconds.     Coloration: Skin is not jaundiced or pale.     Findings: No bruising, erythema, lesion or rash.  Neurological:     General: No focal deficit present.     Mental Status: She is alert and oriented to person, place, and time. Mental status is at baseline.  Psychiatric:        Mood and Affect: Mood normal.        Behavior: Behavior normal.        Thought Content: Thought content normal.        Judgment: Judgment normal.     Results for orders placed or performed in visit on 05/13/20  Novel Coronavirus, NAA ([facility])   Specimen: Saline  Result Value Ref Range   SARS-CoV-2, NAA Not Detected Not Detected  SARS-COV-2, NAA 2 DAY TAT  Result Value Ref Range   SARS-CoV-2, NAA 2 DAY TAT Performed       Assessment & Plan:   Problem List Items Addressed This Visit      Genitourinary   Benign hypertensive renal disease - Primary    Under good control on current regimen. Continue current regimen. Continue to monitor. Call with any concerns. Refills up to date. Labs drawn today.        Relevant Orders   Basic metabolic panel       Follow up  plan: Return in about 4 weeks (around 07/02/2020) for Physical.

## 2020-06-05 LAB — BASIC METABOLIC PANEL
BUN/Creatinine Ratio: 19 (ref 12–28)
BUN: 23 mg/dL (ref 8–27)
CO2: 19 mmol/L — ABNORMAL LOW (ref 20–29)
Calcium: 9.4 mg/dL (ref 8.7–10.3)
Chloride: 101 mmol/L (ref 96–106)
Creatinine, Ser: 1.23 mg/dL — ABNORMAL HIGH (ref 0.57–1.00)
GFR calc Af Amer: 45 mL/min/{1.73_m2} — ABNORMAL LOW (ref >59)
GFR calc non Af Amer: 39 mL/min/{1.73_m2} — ABNORMAL LOW (ref >59)
Glucose: 89 mg/dL (ref 65–99)
Potassium: 4.6 mmol/L (ref 3.5–5.2)
Sodium: 137 mmol/L (ref 134–144)

## 2020-06-17 ENCOUNTER — Ambulatory Visit: Payer: Medicare Other | Admitting: Family Medicine

## 2020-06-28 ENCOUNTER — Ambulatory Visit (INDEPENDENT_AMBULATORY_CARE_PROVIDER_SITE_OTHER): Payer: Medicare Other

## 2020-06-28 VITALS — Ht 66.0 in | Wt 175.0 lb

## 2020-06-28 DIAGNOSIS — Z0000 Encounter for general adult medical examination without abnormal findings: Secondary | ICD-10-CM

## 2020-06-28 NOTE — Progress Notes (Signed)
I connected with Faith Cox today by telephone and verified that I am speaking with the correct person using two identifiers. Location patient: home Location provider: work Persons participating in the virtual visit: Florene Route LPN.   I discussed the limitations, risks, security and privacy concerns of performing an evaluation and management service by telephone and the availability of in person appointments. I also discussed with the patient that there may be a patient responsible charge related to this service. The patient expressed understanding and verbally consented to this telephonic visit.    Interactive audio and video telecommunications were attempted between this provider and patient, however failed, due to patient having technical difficulties OR patient did not have access to video capability.  We continued and completed visit with audio only.     Vital signs may be patient reported or missing.  Subjective:   Faith Cox is a 87 y.o. female who presents for Medicare Annual (Subsequent) preventive examination.  Review of Systems     Cardiac Risk Factors include: advanced age (>27men, >32 women);dyslipidemia;hypertension     Objective:    Today's Vitals   06/28/20 0856  Weight: 175 lb (79.4 kg)  Height: 5\' 6"  (1.676 m)   Body mass index is 28.25 kg/m.  Advanced Directives 06/28/2020 04/22/2020 06/06/2016 06/01/2015  Does Patient Have a Medical Advance Directive? Yes No No No  Type of Advance Directive Living will - - -  Would patient like information on creating a medical advance directive? - - Yes (MAU/Ambulatory/Procedural Areas - Information given) Yes - Educational materials given    Current Medications (verified) Outpatient Encounter Medications as of 06/28/2020  Medication Sig  . aspirin 81 MG tablet Take 81 mg by mouth daily.  . Calcium Carb-Cholecalciferol [phone] MG-UNIT CAPS Take by mouth.  . hydrALAZINE (APRESOLINE) 25 MG tablet Take 1  tablet (25 mg total) by mouth in the morning and at bedtime.  . indapamide (LOZOL) 2.5 MG tablet Take 1 tablet (2.5 mg total) by mouth daily.  . IRON PO Take 500 mg by mouth daily.  Marland Kitchen lisinopril (ZESTRIL) 40 MG tablet Take 1 tablet (40 mg total) by mouth daily.  . Omega-3 Fatty Acids (FISH OIL) 1000 MG CPDR Take by mouth.  . simvastatin (ZOCOR) 20 MG tablet Take 1 tablet (20 mg total) by mouth at bedtime.  . vitamin B-12 (CYANOCOBALAMIN) 1000 MCG tablet Take 1,000 mcg by mouth daily.  . [DISCONTINUED] simvastatin (ZOCOR) 20 MG tablet Take 1 tablet (20 mg total) by mouth at bedtime.   No facility-administered encounter medications on file as of 06/28/2020.    Allergies (verified) Amlodipine   History: Past Medical History:  Diagnosis Date  . Cancer ([place])    skin  . Hyperlipidemia   . Hypertension    Past Surgical History:  Procedure Laterality Date  . ABDOMINAL HYSTERECTOMY    . APPENDECTOMY    . BASAL CELL CARCINOMA EXCISION Right 12/24/2019   Right chin  . BASAL CELL CARCINOMA EXCISION Right 01/22/2020, 01/29/20   Right post auricular  . bladder repair    . BREAST EXCISIONAL BIOPSY Left 08/09/1978   neg  . GALLBLADDER SURGERY    . TOE SURGERY     Family History  Problem Relation Age of Onset  . Pancreatic cancer Mother   . Stroke Father   . Alzheimer's disease Paternal Grandmother   . Leukemia Brother   . Breast cancer Neg Hx    Social History   Socioeconomic History  .  Marital status: Married    Spouse name: Not on file  . Number of children: Not on file  . Years of education: Not on file  . Highest education level: Not on file  Occupational History  . Occupation: retired  Tobacco Use  . Smoking status: Former Smoker    Quit date: 04/11/1979    Years since quitting: 41.2  . Smokeless tobacco: Never Used  . Tobacco comment: quit 30 years ago   Vaping Use  . Vaping Use: Never used  Substance and Sexual Activity  . Alcohol use: Yes    Alcohol/week: 0.0  standard drinks    Comment: beer on occasion  . Drug use: No  . Sexual activity: Not Currently  Other Topics Concern  . Not on file  Social History Narrative  . Not on file   Social Determinants of Health   Financial Resource Strain: Low Risk   . Difficulty of Paying Living Expenses: Not hard at all  Food Insecurity: No Food Insecurity  . Worried About Charity fundraiser in the Last Year: Never true  . Ran Out of Food in the Last Year: Never true  Transportation Needs: No Transportation Needs  . Lack of Transportation (Medical): No  . Lack of Transportation (Non-Medical): No  Physical Activity: Sufficiently Active  . Days of Exercise per Week: 5 days  . Minutes of Exercise per Session: 30 min  Stress: No Stress Concern Present  . Feeling of Stress : Not at all  Social Connections: Not on file    Tobacco Counseling Counseling given: Not Answered Comment: quit 30 years ago    Clinical Intake:  Pre-visit preparation completed: Yes  Pain : No/denies pain     Nutritional Status: BMI 25 -29 Overweight Nutritional Risks: None Diabetes: No  How often do you need to have someone help you when you read instructions, pamphlets, or other written materials from your doctor or pharmacy?: 1 - Never What is the last grade level you completed in school?: 12th grade  Diabetic? no  Interpreter Needed?: No  Information entered by :: NAllen LPN   Activities of Daily Living In your present state of health, do you have any difficulty performing the following activities: 06/28/2020  Hearing? Y  Comment trouble understanding at times  Vision? N  Difficulty concentrating or making decisions? N  Walking or climbing stairs? N  Dressing or bathing? N  Doing errands, shopping? N  Preparing Food and eating ? N  Using the Toilet? N  In the past six months, have you accidently leaked urine? N  Do you have problems with loss of bowel control? N  Managing your Medications? N   Managing your Finances? N  Housekeeping or managing your Housekeeping? N  Some recent data might be hidden    Patient Care Team: Valerie Roys, DO as PCP - General (Family Medicine) Dasher, Rayvon Char, MD (Dermatology)  Indicate any recent Medical Services you may have received from other than [facility] providers in the past year (date may be approximate).     Assessment:   This is a routine wellness examination for [place].  Hearing/Vision screen  Hearing Screening   125Hz  250Hz  500Hz  1000Hz  2000Hz  3000Hz  4000Hz  6000Hz  8000Hz   Right ear:           Left ear:           Vision Screening Comments: Regular eye exams, Dr. Ellin Mayhew  Dietary issues and exercise activities discussed: Current Exercise Habits: Home exercise routine,  Type of exercise: strength training/weights;walking, Time (Minutes): 30, Frequency (Times/Week): 5, Weekly Exercise (Minutes/Week): 150  Goals    . Patient Stated     06/28/2020, no goals      Depression Screen PHQ 2/9 Scores 06/28/2020 06/23/2019 06/18/2018 06/15/2017 06/06/2016 06/01/2015  PHQ - 2 Score 0 0 0 0 0 0  PHQ- 9 Score - 0 0 1 - -    Fall Risk Fall Risk  06/28/2020 06/23/2019 06/18/2018 06/18/2018 06/06/2016  Falls in the past year? 0 0 0 0 No  Number falls in past yr: - 0 0 - -  Injury with Fall? - 0 0 - -  Risk for fall due to : Medication side effect - - - -  Follow up Falls evaluation completed;Education provided;Falls prevention discussed - - Falls evaluation completed -    FALL RISK PREVENTION PERTAINING TO THE HOME:  Any stairs in or around the home? Yes  If so, are there any without handrails? No  Home free of loose throw rugs in walkways, pet beds, electrical cords, etc? Yes  Adequate lighting in your home to reduce risk of falls? Yes   ASSISTIVE DEVICES UTILIZED TO PREVENT FALLS:  Life alert? No  Use of a cane, walker or w/c? No  Grab bars in the bathroom? Yes  Shower chair or bench in shower? Yes  Elevated toilet seat or a handicapped  toilet? Yes   TIMED UP AND GO:  Was the test performed? No .    Cognitive Function:     6CIT Screen 06/28/2020 06/23/2019 06/18/2018 06/15/2017 06/06/2016  What Year? 0 points 0 points 0 points 0 points 0 points  What month? 0 points 0 points 0 points 0 points 0 points  What time? 0 points 0 points 0 points 0 points 0 points  Count back from 20 2 points 0 points 2 points 0 points 0 points  Months in reverse 0 points 0 points 0 points 0 points 2 points  Repeat phrase 6 points 0 points 2 points 2 points 2 points  Total Score 8 0 4 2 4     Immunizations Immunization History  Administered Date(s) Administered  . Fluad Quad(high Dose 65+) 12/19/2018, 12/26/2019  . Influenza, High Dose Seasonal PF 12/08/2015, 12/18/2017  . Influenza-Unspecified 12/10/2014, 12/09/2016  . PFIZER(Purple Top)SARS-COV-2 Vaccination 05/05/2019, 05/26/2019, 01/12/2020  . Pneumococcal Conjugate-13 11/09/2014  . Pneumococcal Polysaccharide-23 12/08/2015  . Td 06/06/2016    TDAP status: Up to date  Flu Vaccine status: Up to date  Pneumococcal vaccine status: Up to date  Covid-19 vaccine status: Completed vaccines  Qualifies for Shingles Vaccine? Yes   Zostavax completed No   Shingrix Completed?: No.    Education has been provided regarding the importance of this vaccine. Patient has been advised to call insurance company to determine out of pocket expense if they have not yet received this vaccine. Advised may also receive vaccine at local pharmacy or Health Dept. Verbalized acceptance and understanding.  Screening Tests Health Maintenance  Topic Date Due  . COVID-19 Vaccine (4 - Booster for Pfizer series) 07/12/2020  . DEXA SCAN  10/16/2021  . TETANUS/TDAP  06/06/2026  . INFLUENZA VACCINE  Completed  . PNA vac Low Risk Adult  Completed  . HPV VACCINES  Aged Out    Health Maintenance  There are no preventive care reminders to display for this patient.  Colorectal cancer screening: No longer  required.   Mammogram status: Completed 10/20/2019. Repeat every year  Bone Density status: Completed 10/17/2018.  Lung Cancer Screening: (Low Dose CT Chest recommended if Age 26-80 years, 30 pack-year currently smoking OR have quit w/in 15years.) does not qualify.   Lung Cancer Screening Referral: no  Additional Screening:  Hepatitis C Screening: does not qualify;  Vision Screening: Recommended annual ophthalmology exams for early detection of glaucoma and other disorders of the eye. Is the patient up to date with their annual eye exam?  Yes  Who is the provider or what is the name of the office in which the patient attends annual eye exams? Dr. Ellin Mayhew If pt is not established with a provider, would they like to be referred to a provider to establish care? No .   Dental Screening: Recommended annual dental exams for proper oral hygiene  Community Resource Referral / Chronic Care Management: CRR required this visit?  No   CCM required this visit?  No      Plan:     I have personally reviewed and noted the following in the patient's chart:   . Medical and social history . Use of alcohol, tobacco or illicit drugs  . Current medications and supplements . Functional ability and status . Nutritional status . Physical activity . Advanced directives . List of other physicians . Hospitalizations, surgeries, and ER visits in previous 12 months . Vitals . Screenings to include cognitive, depression, and falls . Referrals and appointments  In addition, I have reviewed and discussed with patient certain preventive protocols, quality metrics, and best practice recommendations. A written personalized care plan for preventive services as well as general preventive health recommendations were provided to patient.     Kellie Simmering, LPN   [id number]   Nurse Notes:

## 2020-06-28 NOTE — Patient Instructions (Signed)
Faith Cox , Thank you for taking time to come for your Medicare Wellness Visit. I appreciate your ongoing commitment to your health goals. Please review the following plan we discussed and let me know if I can assist you in the future.   Screening recommendations/referrals: Colonoscopy: not required Mammogram: completed 10/20/2019 Bone Density: completed 10/17/2018 Recommended yearly ophthalmology/optometry visit for glaucoma screening and checkup Recommended yearly dental visit for hygiene and checkup  Vaccinations: Influenza vaccine: completed 12/26/2019, due 11/08/2020 Pneumococcal vaccine: completed 12/08/2015 Tdap vaccine: completed 2/27/218, due 06/06/2026 Shingles vaccine: discussed   Covid-19: 01/12/2020, 05/26/2019, 05/05/2019  Advanced directives: Please bring a copy of your POA (Power of Attorney) and/or Living Will to your next appointment.   Conditions/risks identified: none  Next appointment: Follow up in one year for your annual wellness visit    Preventive Care 65 Years and Older, Female Preventive care refers to lifestyle choices and visits with your health care provider that can promote health and wellness. What does preventive care include?  A yearly physical exam. This is also called an annual well check.  Dental exams once or twice a year.  Routine eye exams. Ask your health care provider how often you should have your eyes checked.  Personal lifestyle choices, including:  Daily care of your teeth and gums.  Regular physical activity.  Eating a healthy diet.  Avoiding tobacco and drug use.  Limiting alcohol use.  Practicing safe sex.  Taking low-dose aspirin every day.  Taking vitamin and mineral supplements as recommended by your health care provider. What happens during an annual well check? The services and screenings done by your health care provider during your annual well check will depend on your age, overall health, lifestyle risk factors, and  family history of disease. Counseling  Your health care provider may ask you questions about your:  Alcohol use.  Tobacco use.  Drug use.  Emotional well-being.  Home and relationship well-being.  Sexual activity.  Eating habits.  History of falls.  Memory and ability to understand (cognition).  Work and work Statistician.  Reproductive health. Screening  You may have the following tests or measurements:  Height, weight, and BMI.  Blood pressure.  Lipid and cholesterol levels. These may be checked every 5 years, or more frequently if you are over 36 years old.  Skin check.  Lung cancer screening. You may have this screening every year starting at age 37 if you have a 30-pack-year history of smoking and currently smoke or have quit within the past 15 years.  Fecal occult blood test (FOBT) of the stool. You may have this test every year starting at age 75.  Flexible sigmoidoscopy or colonoscopy. You may have a sigmoidoscopy every 5 years or a colonoscopy every 10 years starting at age 73.  Hepatitis C blood test.  Hepatitis B blood test.  Sexually transmitted disease (STD) testing.  Diabetes screening. This is done by checking your blood sugar (glucose) after you have not eaten for a while (fasting). You may have this done every 1-3 years.  Bone density scan. This is done to screen for osteoporosis. You may have this done starting at age 4.  Mammogram. This may be done every 1-2 years. Talk to your health care provider about how often you should have regular mammograms. Talk with your health care provider about your test results, treatment options, and if necessary, the need for more tests. Vaccines  Your health care provider may recommend certain vaccines, such as:  Influenza  vaccine. This is recommended every year.  Tetanus, diphtheria, and acellular pertussis (Tdap, Td) vaccine. You may need a Td booster every 10 years.  Zoster vaccine. You may need this  after age 52.  Pneumococcal 13-valent conjugate (PCV13) vaccine. One dose is recommended after age 75.  Pneumococcal polysaccharide (PPSV23) vaccine. One dose is recommended after age 47. Talk to your health care provider about which screenings and vaccines you need and how often you need them. This information is not intended to replace advice given to you by your health care provider. Make sure you discuss any questions you have with your health care provider. Document Released: 04/23/2015 Document Revised: 12/15/2015 Document Reviewed: 01/26/2015 Elsevier Interactive Patient Education  2017 [place] Prevention in the Home Falls can cause injuries. They can happen to people of all ages. There are many things you can do to make your home safe and to help prevent falls. What can I do on the outside of my home?  Regularly fix the edges of walkways and driveways and fix any cracks.  Remove anything that might make you trip as you walk through a door, such as a raised step or threshold.  Trim any bushes or trees on the path to your home.  Use bright outdoor lighting.  Clear any walking paths of anything that might make someone trip, such as rocks or tools.  Regularly check to see if handrails are loose or broken. Make sure that both sides of any steps have handrails.  Any raised decks and porches should have guardrails on the edges.  Have any leaves, snow, or ice cleared regularly.  Use sand or salt on walking paths during winter.  Clean up any spills in your garage right away. This includes oil or grease spills. What can I do in the bathroom?  Use night lights.  Install grab bars by the toilet and in the tub and shower. Do not use towel bars as grab bars.  Use non-skid mats or decals in the tub or shower.  If you need to sit down in the shower, use a plastic, non-slip stool.  Keep the floor dry. Clean up any water that spills on the floor as soon as it  happens.  Remove soap buildup in the tub or shower regularly.  Attach bath mats securely with double-sided non-slip rug tape.  Do not have throw rugs and other things on the floor that can make you trip. What can I do in the bedroom?  Use night lights.  Make sure that you have a light by your bed that is easy to reach.  Do not use any sheets or blankets that are too big for your bed. They should not hang down onto the floor.  Have a firm chair that has side arms. You can use this for support while you get dressed.  Do not have throw rugs and other things on the floor that can make you trip. What can I do in the kitchen?  Clean up any spills right away.  Avoid walking on wet floors.  Keep items that you use a lot in easy-to-reach places.  If you need to reach something above you, use a strong step stool that has a grab bar.  Keep electrical cords out of the way.  Do not use floor polish or wax that makes floors slippery. If you must use wax, use non-skid floor wax.  Do not have throw rugs and other things on the floor that can  make you trip. What can I do with my stairs?  Do not leave any items on the stairs.  Make sure that there are handrails on both sides of the stairs and use them. Fix handrails that are broken or loose. Make sure that handrails are as long as the stairways.  Check any carpeting to make sure that it is firmly attached to the stairs. Fix any carpet that is loose or worn.  Avoid having throw rugs at the top or bottom of the stairs. If you do have throw rugs, attach them to the floor with carpet tape.  Make sure that you have a light switch at the top of the stairs and the bottom of the stairs. If you do not have them, ask someone to add them for you. What else can I do to help prevent falls?  Wear shoes that:  Do not have high heels.  Have rubber bottoms.  Are comfortable and fit you well.  Are closed at the toe. Do not wear sandals.  If you  use a stepladder:  Make sure that it is fully opened. Do not climb a closed stepladder.  Make sure that both sides of the stepladder are locked into place.  Ask someone to hold it for you, if possible.  Clearly mark and make sure that you can see:  Any grab bars or handrails.  First and last steps.  Where the edge of each step is.  Use tools that help you move around (mobility aids) if they are needed. These include:  Canes.  Walkers.  Scooters.  Crutches.  Turn on the lights when you go into a dark area. Replace any light bulbs as soon as they burn out.  Set up your furniture so you have a clear path. Avoid moving your furniture around.  If any of your floors are uneven, fix them.  If there are any pets around you, be aware of where they are.  Review your medicines with your doctor. Some medicines can make you feel dizzy. This can increase your chance of falling. Ask your doctor what other things that you can do to help prevent falls. This information is not intended to replace advice given to you by your health care provider. Make sure you discuss any questions you have with your health care provider. Document Released: 01/21/2009 Document Revised: 09/02/2015 Document Reviewed: 05/01/2014 Elsevier Interactive Patient Education  2017 [employer].

## 2020-06-29 DIAGNOSIS — I700 Atherosclerosis of aorta: Secondary | ICD-10-CM | POA: Insufficient documentation

## 2020-06-30 DIAGNOSIS — C44319 Basal cell carcinoma of skin of other parts of face: Secondary | ICD-10-CM | POA: Diagnosis not present

## 2020-06-30 HISTORY — PX: BASAL CELL CARCINOMA EXCISION: SHX1214

## 2020-07-07 ENCOUNTER — Ambulatory Visit (INDEPENDENT_AMBULATORY_CARE_PROVIDER_SITE_OTHER): Payer: Medicare Other | Admitting: Family Medicine

## 2020-07-07 ENCOUNTER — Other Ambulatory Visit: Payer: Self-pay

## 2020-07-07 ENCOUNTER — Encounter: Payer: Self-pay | Admitting: Family Medicine

## 2020-07-07 VITALS — BP 131/74 | HR 76 | Temp 97.7°F | Ht 64.8 in | Wt 178.0 lb

## 2020-07-07 DIAGNOSIS — I129 Hypertensive chronic kidney disease with stage 1 through stage 4 chronic kidney disease, or unspecified chronic kidney disease: Secondary | ICD-10-CM | POA: Diagnosis not present

## 2020-07-07 DIAGNOSIS — I700 Atherosclerosis of aorta: Secondary | ICD-10-CM | POA: Diagnosis not present

## 2020-07-07 DIAGNOSIS — D692 Other nonthrombocytopenic purpura: Secondary | ICD-10-CM

## 2020-07-07 DIAGNOSIS — Z0000 Encounter for general adult medical examination without abnormal findings: Secondary | ICD-10-CM | POA: Diagnosis not present

## 2020-07-07 DIAGNOSIS — E782 Mixed hyperlipidemia: Secondary | ICD-10-CM | POA: Diagnosis not present

## 2020-07-07 LAB — URINALYSIS, ROUTINE W REFLEX MICROSCOPIC
Bilirubin, UA: NEGATIVE
Glucose, UA: NEGATIVE
Ketones, UA: NEGATIVE
Leukocytes,UA: NEGATIVE
Nitrite, UA: NEGATIVE
Protein,UA: NEGATIVE
RBC, UA: NEGATIVE
Specific Gravity, UA: 1.015 (ref 1.005–1.030)
Urobilinogen, Ur: 0.2 mg/dL (ref 0.2–1.0)
pH, UA: 6 (ref 5.0–7.5)

## 2020-07-07 LAB — MICROALBUMIN, URINE WAIVED
Creatinine, Urine Waived: 100 mg/dL (ref 10–300)
Microalb, Ur Waived: 10 mg/L (ref 0–19)
Microalb/Creat Ratio: <30 mg/g (ref <30)

## 2020-07-07 MED ORDER — INDAPAMIDE 2.5 MG PO TABS: 2.5000 mg | ORAL_TABLET | Freq: Every day | ORAL | 1 refills | Status: DC

## 2020-07-07 MED ORDER — SIMVASTATIN 20 MG PO TABS: 20.0000 mg | ORAL_TABLET | Freq: Every day | ORAL | 1 refills | Status: DC

## 2020-07-07 MED ORDER — LISINOPRIL 40 MG PO TABS: 40.0000 mg | ORAL_TABLET | Freq: Every day | ORAL | 1 refills | Status: DC

## 2020-07-07 MED ORDER — HYDRALAZINE HCL 25 MG PO TABS: 25.0000 mg | ORAL_TABLET | Freq: Two times a day (BID) | ORAL | 1 refills | Status: DC

## 2020-07-07 NOTE — Assessment & Plan Note (Signed)
Will keep BP and cholesterol under good control. Continue to monitor. Call with any concerns.  

## 2020-07-07 NOTE — Assessment & Plan Note (Signed)
Reassured patient. Continue to monitor. Call with any concerns.  

## 2020-07-07 NOTE — Assessment & Plan Note (Signed)
Under good control on current regimen. Continue current regimen. Continue to monitor. Call with any concerns. Refills given. Labs drawn today.   

## 2020-07-07 NOTE — Patient Instructions (Signed)
Health Maintenance After Age 85 After age 85, you are at a higher risk for certain long-term diseases and infections as well as injuries from falls. Falls are a major cause of broken bones and head injuries in people who are older than age 85. Getting regular preventive care can help to keep you healthy and well. Preventive care includes getting regular testing and making lifestyle changes as recommended by your health care provider. Talk with your health care provider about:  Which screenings and tests you should have. A screening is a test that checks for a disease when you have no symptoms.  A diet and exercise plan that is right for you. What should I know about screenings and tests to prevent falls? Screening and testing are the best ways to find a health problem early. Early diagnosis and treatment give you the best chance of managing medical conditions that are common after age 85. Certain conditions and lifestyle choices may make you more likely to have a fall. Your health care provider may recommend:  Regular vision checks. Poor vision and conditions such as cataracts can make you more likely to have a fall. If you wear glasses, make sure to get your prescription updated if your vision changes.  Medicine review. Work with your health care provider to regularly review all of the medicines you are taking, including over-the-counter medicines. Ask your health care provider about any side effects that may make you more likely to have a fall. Tell your health care provider if any medicines that you take make you feel dizzy or sleepy.  Osteoporosis screening. Osteoporosis is a condition that causes the bones to get weaker. This can make the bones weak and cause them to break more easily.  Blood pressure screening. Blood pressure changes and medicines to control blood pressure can make you feel dizzy.  Strength and balance checks. Your health care provider may recommend certain tests to check your  strength and balance while standing, walking, or changing positions.  Foot health exam. Foot pain and numbness, as well as not wearing proper footwear, can make you more likely to have a fall.  Depression screening. You may be more likely to have a fall if you have a fear of falling, feel emotionally low, or feel unable to do activities that you used to do.  Alcohol use screening. Using too much alcohol can affect your balance and may make you more likely to have a fall. What actions can I take to lower my risk of falls? General instructions  Talk with your health care provider about your risks for falling. Tell your health care provider if: ? You fall. Be sure to tell your health care provider about all falls, even ones that seem minor. ? You feel dizzy, sleepy, or off-balance.  Take over-the-counter and prescription medicines only as told by your health care provider. These include any supplements.  Eat a healthy diet and maintain a healthy weight. A healthy diet includes low-fat dairy products, low-fat (lean) meats, and fiber from whole grains, beans, and lots of fruits and vegetables. Home safety  Remove any tripping hazards, such as rugs, cords, and clutter.  Install safety equipment such as grab bars in bathrooms and safety rails on stairs.  Keep rooms and walkways well-lit. Activity  Follow a regular exercise program to stay fit. This will help you maintain your balance. Ask your health care provider what types of exercise are appropriate for you.  If you need a cane or walker,   use it as recommended by your health care provider.  Wear supportive shoes that have nonskid soles.   Lifestyle  Do not drink alcohol if your health care provider tells you not to drink.  If you drink alcohol, limit how much you have: ? 0-1 drink a day for women. ? 0-2 drinks a day for men.  Be aware of how much alcohol is in your drink. In the [place] one drink equals one typical bottle of beer (12  oz), one-half glass of wine (5 oz), or one shot of hard liquor (1 oz).  Do not use any products that contain nicotine or tobacco, such as cigarettes and e-cigarettes. If you need help quitting, ask your health care provider. Summary  Having a healthy lifestyle and getting preventive care can help to protect your health and wellness after age 85.  Screening and testing are the best way to find a health problem early and help you avoid having a fall. Early diagnosis and treatment give you the best chance for managing medical conditions that are more common for people who are older than age 85.  Falls are a major cause of broken bones and head injuries in people who are older than age 85. Take precautions to prevent a fall at home.  Work with your health care provider to learn what changes you can make to improve your health and wellness and to prevent falls. This information is not intended to replace advice given to you by your health care provider. Make sure you discuss any questions you have with your health care provider. Document Revised: 07/18/2018 Document Reviewed: 02/07/2017 Elsevier Patient Education  2021 [employer]  

## 2020-07-07 NOTE — Progress Notes (Signed)
BP 131/74 (BP Location: Left Arm, Cuff Size: Normal)   Pulse 76   Temp 97.7 F (36.5 C) (Oral)   Ht 5' 4.8" (1.646 m)   Wt 178 lb (80.7 kg)   SpO2 98%   BMI 29.80 kg/m    Subjective:    Patient ID: Faith Cox, female    DOB: February 20, 1930, 87 y.o.   MRN: [id number]  HPI: Faith Cox is a 87 y.o. female presenting on 07/07/2020 for comprehensive medical examination. Current medical complaints include:  HYPERTENSION / HYPERLIPIDEMIA Satisfied with current treatment? yes Duration of hypertension: chronic BP monitoring frequency: a few times a week BP range: 110s-130s/70s-80s BP medication side effects: no Duration of hyperlipidemia: chronic Cholesterol medication side effects: no Cholesterol supplements: fish oil Past cholesterol medications: simvastatin Medication compliance: excellent compliance Aspirin: yes Recent stressors: no Recurrent headaches: no Visual changes: no Palpitations: no Dyspnea: no Chest pain: no Lower extremity edema: no Dizzy/lightheaded: no  Menopausal Symptoms: no  Depression Screen done today and results listed below:  Depression screen [facility] 2/9 07/07/2020 06/28/2020 06/23/2019 06/18/2018 06/15/2017  Decreased Interest 0 0 0 0 0  Down, Depressed, Hopeless 0 0 0 0 0  PHQ - 2 Score 0 0 0 0 0  Altered sleeping 0 - 0 0 1  Tired, decreased energy 1 - 0 0 0  Change in appetite 0 - 0 0 0  Feeling bad or failure about yourself  0 - 0 0 0  Trouble concentrating 0 - 0 0 0  Moving slowly or fidgety/restless 0 - 0 0 0  Suicidal thoughts 0 - 0 0 0  PHQ-9 Score 1 - 0 0 1  Difficult doing work/chores Not difficult at all - Not difficult at all Not difficult at all Not difficult at all     Past Medical History:  Past Medical History:  Diagnosis Date  . Cancer ([place])    skin  . Hyperlipidemia   . Hypertension     Surgical History:  Past Surgical History:  Procedure Laterality Date  . ABDOMINAL HYSTERECTOMY    . APPENDECTOMY    . BASAL CELL  CARCINOMA EXCISION Right 12/24/2019   Right chin  . BASAL CELL CARCINOMA EXCISION Right 01/22/2020, 01/29/20   Right post auricular  . BASAL CELL CARCINOMA EXCISION  06/30/2020  . bladder repair    . BREAST EXCISIONAL BIOPSY Left 08/09/1978   neg  . GALLBLADDER SURGERY    . TOE SURGERY      Medications:  Current Outpatient Medications on File Prior to Visit  Medication Sig  . aspirin 81 MG tablet Take 81 mg by mouth daily.  . Calcium Carb-Cholecalciferol [id number] MG-UNIT CAPS Take by mouth.  . IRON PO Take 500 mg by mouth daily.  . Omega-3 Fatty Acids (FISH OIL) 1000 MG CPDR Take by mouth.  . vitamin B-12 (CYANOCOBALAMIN) 1000 MCG tablet Take 1,000 mcg by mouth daily.   No current facility-administered medications on file prior to visit.    Allergies:  Allergies  Allergen Reactions  . Amlodipine Swelling    Social History:  Social History   Socioeconomic History  . Marital status: Married    Spouse name: Not on file  . Number of children: Not on file  . Years of education: Not on file  . Highest education level: Not on file  Occupational History  . Occupation: retired  Tobacco Use  . Smoking status: Former Smoker    Quit date: 04/11/1979    Years since  quitting: 41.2  . Smokeless tobacco: Never Used  . Tobacco comment: quit 30 years ago   Vaping Use  . Vaping Use: Never used  Substance and Sexual Activity  . Alcohol use: Yes    Alcohol/week: 0.0 standard drinks    Comment: beer on occasion  . Drug use: No  . Sexual activity: Not Currently  Other Topics Concern  . Not on file  Social History Narrative  . Not on file   Social Determinants of Health   Financial Resource Strain: Low Risk   . Difficulty of Paying Living Expenses: Not hard at all  Food Insecurity: No Food Insecurity  . Worried About Charity fundraiser in the Last Year: Never true  . Ran Out of Food in the Last Year: Never true  Transportation Needs: No Transportation Needs  . Lack of  Transportation (Medical): No  . Lack of Transportation (Non-Medical): No  Physical Activity: Sufficiently Active  . Days of Exercise per Week: 5 days  . Minutes of Exercise per Session: 30 min  Stress: No Stress Concern Present  . Feeling of Stress : Not at all  Social Connections: Not on file  Intimate Partner Violence: Not on file   Social History   Tobacco Use  Smoking Status Former Smoker  . Quit date: 04/11/1979  . Years since quitting: 41.2  Smokeless Tobacco Never Used  Tobacco Comment   quit 30 years ago    Social History   Substance and Sexual Activity  Alcohol Use Yes  . Alcohol/week: 0.0 standard drinks   Comment: beer on occasion    Family History:  Family History  Problem Relation Age of Onset  . Pancreatic cancer Mother   . Stroke Father   . Alzheimer's disease Paternal Grandmother   . Leukemia Brother   . Breast cancer Neg Hx     Past medical history, surgical history, medications, allergies, family history and social history reviewed with patient today and changes made to appropriate areas of the chart.   Review of Systems  Constitutional: Negative.   HENT: Negative.   Eyes: Negative.   Respiratory: Negative.   Cardiovascular: Negative.   Gastrointestinal: Negative.   Genitourinary: Negative.   Musculoskeletal: Negative.   Skin: Negative.   Neurological: Negative.   Endo/Heme/Allergies: Negative for environmental allergies and polydipsia. Bruises/bleeds easily.  Psychiatric/Behavioral: Negative.    All other ROS negative except what is listed above and in the HPI.      Objective:    BP 131/74 (BP Location: Left Arm, Cuff Size: Normal)   Pulse 76   Temp 97.7 F (36.5 C) (Oral)   Ht 5' 4.8" (1.646 m)   Wt 178 lb (80.7 kg)   SpO2 98%   BMI 29.80 kg/m   Wt Readings from Last 3 Encounters:  07/07/20 178 lb (80.7 kg)  06/28/20 175 lb (79.4 kg)  06/04/20 179 lb (81.2 kg)    Physical Exam Vitals and nursing note reviewed.   Constitutional:      General: She is not in acute distress.    Appearance: Normal appearance. She is not ill-appearing, toxic-appearing or diaphoretic.  HENT:     Head: Normocephalic and atraumatic.     Right Ear: Tympanic membrane, ear canal and external ear normal. There is no impacted cerumen.     Left Ear: Tympanic membrane, ear canal and external ear normal. There is no impacted cerumen.     Nose: Nose normal. No congestion or rhinorrhea.     Mouth/Throat:  Mouth: Mucous membranes are moist.     Pharynx: Oropharynx is clear. No oropharyngeal exudate or posterior oropharyngeal erythema.  Eyes:     General: No scleral icterus.       Right eye: No discharge.        Left eye: No discharge.     Extraocular Movements: Extraocular movements intact.     Conjunctiva/sclera: Conjunctivae normal.     Pupils: Pupils are equal, round, and reactive to light.  Neck:     Vascular: No carotid bruit.  Cardiovascular:     Rate and Rhythm: Normal rate and regular rhythm.     Pulses: Normal pulses.     Heart sounds: No murmur heard. No friction rub. No gallop.   Pulmonary:     Effort: Pulmonary effort is normal. No respiratory distress.     Breath sounds: Normal breath sounds. No stridor. No wheezing, rhonchi or rales.  Chest:     Chest wall: No tenderness.  Abdominal:     General: Abdomen is flat. Bowel sounds are normal. There is no distension.     Palpations: Abdomen is soft. There is no mass.     Tenderness: There is no abdominal tenderness. There is no right CVA tenderness, left CVA tenderness, guarding or rebound.     Hernia: No hernia is present.  Genitourinary:    Comments: Breast and pelvic exams deferred with shared decision making Musculoskeletal:        General: No swelling, tenderness, deformity or signs of injury.     Cervical back: Normal range of motion and neck supple. No rigidity. No muscular tenderness.     Right lower leg: No edema.     Left lower leg: No edema.   Lymphadenopathy:     Cervical: No cervical adenopathy.  Skin:    General: Skin is warm and dry.     Capillary Refill: Capillary refill takes less than 2 seconds.     Coloration: Skin is not jaundiced or pale.     Findings: No bruising, erythema, lesion or rash.  Neurological:     General: No focal deficit present.     Mental Status: She is alert and oriented to person, place, and time. Mental status is at baseline.     Cranial Nerves: No cranial nerve deficit.     Sensory: No sensory deficit.     Motor: No weakness.     Coordination: Coordination normal.     Gait: Gait normal.     Deep Tendon Reflexes: Reflexes normal.  Psychiatric:        Mood and Affect: Mood normal.        Behavior: Behavior normal.        Thought Content: Thought content normal.        Judgment: Judgment normal.     Results for orders placed or performed in visit on 31/51/76  Basic metabolic panel  Result Value Ref Range   Glucose 89 65 - 99 mg/dL   BUN 23 8 - 27 mg/dL   Creatinine, Ser 1.23 (H) 0.57 - 1.00 mg/dL   GFR calc non Af Amer 39 (L) >59 mL/min/1.73   GFR calc Af Amer 45 (L) >59 mL/min/1.73   BUN/Creatinine Ratio 19 12 - 28   Sodium 137 134 - 144 mmol/L   Potassium 4.6 3.5 - 5.2 mmol/L   Chloride 101 96 - 106 mmol/L   CO2 19 (L) 20 - 29 mmol/L   Calcium 9.4 8.7 - 10.3 mg/dL  Assessment & Plan:   Problem List Items Addressed This Visit      Cardiovascular and Mediastinum   Senile purpura ([place])    Reassured patient. Continue to monitor. Call with any concerns.       Relevant Medications   simvastatin (ZOCOR) 20 MG tablet   lisinopril (ZESTRIL) 40 MG tablet   hydrALAZINE (APRESOLINE) 25 MG tablet   indapamide (LOZOL) 2.5 MG tablet   Aortic atherosclerosis ([facility])    Will keep BP and cholesterol under good control. Continue to monitor. Call with any concerns.       Relevant Medications   simvastatin (ZOCOR) 20 MG tablet   lisinopril (ZESTRIL) 40 MG tablet   hydrALAZINE  (APRESOLINE) 25 MG tablet   indapamide (LOZOL) 2.5 MG tablet     Genitourinary   Benign hypertensive renal disease    Under good control on current regimen. Continue current regimen. Continue to monitor. Call with any concerns. Refills given. Labs drawn today.        Relevant Orders   CBC with Differential/Platelet   Comprehensive metabolic panel   Urinalysis, Routine w reflex microscopic   TSH   Microalbumin, Urine Waived     Other   Hyperlipidemia    Under good control on current regimen. Continue current regimen. Continue to monitor. Call with any concerns. Refills given. Labs drawn today.        Relevant Medications   simvastatin (ZOCOR) 20 MG tablet   lisinopril (ZESTRIL) 40 MG tablet   hydrALAZINE (APRESOLINE) 25 MG tablet   indapamide (LOZOL) 2.5 MG tablet   Other Relevant Orders   CBC with Differential/Platelet   Comprehensive metabolic panel   Lipid Panel w/o Chol/HDL Ratio    Other Visit Diagnoses    Routine general medical examination at a health care facility    -  Primary   Vaccines up to date. Screening labs checked today. DEXA up to date. Continue diet and exercise. Call with any concerns.        Follow up plan: Return in about 6 months (around 01/07/2021).   LABORATORY TESTING:  - Pap smear: not applicable  IMMUNIZATIONS:   - Tdap: Tetanus vaccination status reviewed: last tetanus booster within 10 years. - Influenza: Up to date - Pneumovax: Up to date - Prevnar: Up to date - COVID: Up to date  SCREENING: -Mammogram: Not applicable  - Colonoscopy: Not applicable  - Bone Density: Up to date   PATIENT COUNSELING:   Advised to take 1 mg of folate supplement per day if capable of pregnancy.   Sexuality: Discussed sexually transmitted diseases, partner selection, use of condoms, avoidance of unintended pregnancy  and contraceptive alternatives.   Advised to avoid cigarette smoking.  I discussed with the patient that most people either abstain  from alcohol or drink within safe limits (<=14/week and <=4 drinks/occasion for males, <=7/weeks and <= 3 drinks/occasion for females) and that the risk for alcohol disorders and other health effects rises proportionally with the number of drinks per week and how often a drinker exceeds daily limits.  Discussed cessation/primary prevention of drug use and availability of treatment for abuse.   Diet: Encouraged to adjust caloric intake to maintain  or achieve ideal body weight, to reduce intake of dietary saturated fat and total fat, to limit sodium intake by avoiding high sodium foods and not adding table salt, and to maintain adequate dietary potassium and calcium preferably from fresh fruits, vegetables, and low-fat dairy products.    stressed the  importance of regular exercise  Injury prevention: Discussed safety belts, safety helmets, smoke detector, smoking near bedding or upholstery.   Dental health: Discussed importance of regular tooth brushing, flossing, and dental visits.    NEXT PREVENTATIVE PHYSICAL DUE IN 1 YEAR. Return in about 6 months (around 01/07/2021).

## 2020-07-08 LAB — CBC WITH DIFFERENTIAL/PLATELET
Basophils Absolute: 0.1 10*3/uL (ref 0.0–0.2)
Basos: 1 %
EOS (ABSOLUTE): 0.6 10*3/uL — ABNORMAL HIGH (ref 0.0–0.4)
Eos: 7 %
Hematocrit: 34.5 % (ref 34.0–46.6)
Hemoglobin: 11.3 g/dL (ref 11.1–15.9)
Immature Grans (Abs): 0 10*3/uL (ref 0.0–0.1)
Immature Granulocytes: 1 %
Lymphocytes Absolute: 1.8 10*3/uL (ref 0.7–3.1)
Lymphs: 20 %
MCH: 30.1 pg (ref 26.6–33.0)
MCHC: 32.8 g/dL (ref 31.5–35.7)
MCV: 92 fL (ref 79–97)
Monocytes Absolute: 0.9 10*3/uL (ref 0.1–0.9)
Monocytes: 10 %
Neutrophils Absolute: 5.4 10*3/uL (ref 1.4–7.0)
Neutrophils: 61 %
Platelets: 233 10*3/uL (ref 150–450)
RBC: 3.76 x10E6/uL — ABNORMAL LOW (ref 3.77–5.28)
RDW: 12.8 % (ref 11.7–15.4)
WBC: 8.8 10*3/uL (ref 3.4–10.8)

## 2020-07-08 LAB — LIPID PANEL W/O CHOL/HDL RATIO
Cholesterol, Total: 138 mg/dL (ref 100–199)
HDL: 39 mg/dL — ABNORMAL LOW (ref >39)
LDL Chol Calc (NIH): 63 mg/dL (ref 0–99)
Triglycerides: 224 mg/dL — ABNORMAL HIGH (ref 0–149)
VLDL Cholesterol Cal: 36 mg/dL (ref 5–40)

## 2020-07-08 LAB — COMPREHENSIVE METABOLIC PANEL
ALT: 16 IU/L (ref 0–32)
AST: 17 IU/L (ref 0–40)
Albumin/Globulin Ratio: 2.3 — ABNORMAL HIGH (ref 1.2–2.2)
Albumin: 4.6 g/dL (ref 3.6–4.6)
Alkaline Phosphatase: 78 IU/L (ref 44–121)
BUN/Creatinine Ratio: 18 (ref 12–28)
BUN: 24 mg/dL (ref 8–27)
Bilirubin Total: 0.5 mg/dL (ref 0.0–1.2)
CO2: 19 mmol/L — ABNORMAL LOW (ref 20–29)
Calcium: 9.6 mg/dL (ref 8.7–10.3)
Chloride: 101 mmol/L (ref 96–106)
Creatinine, Ser: 1.33 mg/dL — ABNORMAL HIGH (ref 0.57–1.00)
Globulin, Total: 2 g/dL (ref 1.5–4.5)
Glucose: 95 mg/dL (ref 65–99)
Potassium: 4.5 mmol/L (ref 3.5–5.2)
Sodium: 141 mmol/L (ref 134–144)
Total Protein: 6.6 g/dL (ref 6.0–8.5)
eGFR: 38 mL/min/{1.73_m2} — ABNORMAL LOW (ref >59)

## 2020-07-08 LAB — TSH: TSH: 1.55 u[IU]/mL (ref 0.450–4.500)

## 2020-07-15 DIAGNOSIS — C44311 Basal cell carcinoma of skin of nose: Secondary | ICD-10-CM | POA: Diagnosis not present

## 2020-07-28 DIAGNOSIS — D0462 Carcinoma in situ of skin of left upper limb, including shoulder: Secondary | ICD-10-CM | POA: Diagnosis not present

## 2020-08-26 DIAGNOSIS — C44311 Basal cell carcinoma of skin of nose: Secondary | ICD-10-CM | POA: Diagnosis not present

## 2020-09-14 ENCOUNTER — Other Ambulatory Visit: Payer: Self-pay | Admitting: Family Medicine

## 2020-09-14 DIAGNOSIS — Z1231 Encounter for screening mammogram for malignant neoplasm of breast: Secondary | ICD-10-CM

## 2020-10-20 ENCOUNTER — Other Ambulatory Visit: Payer: Self-pay

## 2020-10-20 ENCOUNTER — Ambulatory Visit
Admission: RE | Admit: 2020-10-20 | Discharge: 2020-10-20 | Disposition: A | Discharge status: Home / Self Care | Payer: Medicare Other | Source: Ambulatory Visit | Attending: Family Medicine | Admitting: Family Medicine

## 2020-10-20 DIAGNOSIS — Z1231 Encounter for screening mammogram for malignant neoplasm of breast: Secondary | ICD-10-CM | POA: Diagnosis not present

## 2020-10-28 DIAGNOSIS — D2262 Melanocytic nevi of left upper limb, including shoulder: Secondary | ICD-10-CM | POA: Diagnosis not present

## 2020-10-28 DIAGNOSIS — D2272 Melanocytic nevi of left lower limb, including hip: Secondary | ICD-10-CM | POA: Diagnosis not present

## 2020-10-28 DIAGNOSIS — D2261 Melanocytic nevi of right upper limb, including shoulder: Secondary | ICD-10-CM | POA: Diagnosis not present

## 2020-10-28 DIAGNOSIS — Z85828 Personal history of other malignant neoplasm of skin: Secondary | ICD-10-CM | POA: Diagnosis not present

## 2020-11-01 ENCOUNTER — Other Ambulatory Visit: Payer: Self-pay | Admitting: Family Medicine

## 2020-11-15 ENCOUNTER — Other Ambulatory Visit: Payer: Self-pay | Admitting: Family Medicine

## 2020-11-22 ENCOUNTER — Other Ambulatory Visit: Payer: Self-pay | Admitting: Family Medicine

## 2020-12-02 DIAGNOSIS — C4441 Basal cell carcinoma of skin of scalp and neck: Secondary | ICD-10-CM | POA: Diagnosis not present

## 2020-12-09 ENCOUNTER — Other Ambulatory Visit: Payer: Self-pay | Admitting: Family Medicine

## 2021-01-07 ENCOUNTER — Ambulatory Visit (INDEPENDENT_AMBULATORY_CARE_PROVIDER_SITE_OTHER): Payer: Medicare Other | Admitting: Family Medicine

## 2021-01-07 ENCOUNTER — Other Ambulatory Visit: Payer: Self-pay

## 2021-01-07 ENCOUNTER — Encounter: Payer: Self-pay | Admitting: Family Medicine

## 2021-01-07 VITALS — BP 140/60 | HR 66 | Ht 64.0 in | Wt 178.0 lb

## 2021-01-07 DIAGNOSIS — E782 Mixed hyperlipidemia: Secondary | ICD-10-CM

## 2021-01-07 DIAGNOSIS — Z23 Encounter for immunization: Secondary | ICD-10-CM | POA: Diagnosis not present

## 2021-01-07 DIAGNOSIS — I129 Hypertensive chronic kidney disease with stage 1 through stage 4 chronic kidney disease, or unspecified chronic kidney disease: Secondary | ICD-10-CM | POA: Diagnosis not present

## 2021-01-07 MED ORDER — HYDRALAZINE HCL 25 MG PO TABS: ORAL_TABLET | ORAL | 1 refills | Status: DC

## 2021-01-07 MED ORDER — SIMVASTATIN 20 MG PO TABS: 20.0000 mg | ORAL_TABLET | Freq: Every day | ORAL | 1 refills | Status: DC

## 2021-01-07 MED ORDER — INDAPAMIDE 2.5 MG PO TABS: 2.5000 mg | ORAL_TABLET | Freq: Every day | ORAL | 1 refills | Status: DC

## 2021-01-07 MED ORDER — LISINOPRIL 40 MG PO TABS: 40.0000 mg | ORAL_TABLET | Freq: Every day | ORAL | 1 refills | Status: DC

## 2021-01-07 NOTE — Progress Notes (Signed)
BP 140/60   Pulse 66   Ht _0  (1.626 m)   Wt 178 lb (80.7 kg)   BMI 30.55 kg/m    Subjective:    Patient ID: Faith Cox, female    DOB: 03-04-1930, 87 y.o.   MRN: [id number]  HPI: Faith Cox is a 87 y.o. female  Chief Complaint  Patient presents with   Hypertension   HYPERTENSION / [place] Satisfied with current treatment? yes Duration of hypertension: chronic BP monitoring frequency: a few times a week BP range: 110s-130s/60s BP medication side effects: no Past BP meds: hydralazine, indpamide, lisinopril Duration of hyperlipidemia: chronic Cholesterol medication side effects: no Cholesterol supplements: fish oil Past cholesterol medications: simvastatin Medication compliance: excellent compliance Aspirin: yes Recent stressors: no Recurrent headaches: no Visual changes: no Palpitations: no Dyspnea: no Chest pain: no Lower extremity edema: no Dizzy/lightheaded: no  Relevant past medical, surgical, family and social history reviewed and updated as indicated. Interim medical history since our last visit reviewed. Allergies and medications reviewed and updated.  Review of Systems  Constitutional: Negative.   Respiratory: Negative.    Cardiovascular: Negative.   Gastrointestinal: Negative.   Psychiatric/Behavioral: Negative.     Per HPI unless specifically indicated above     Objective:    BP 140/60   Pulse 66   Ht _1  (1.626 m)   Wt 178 lb (80.7 kg)   BMI 30.55 kg/m   Wt Readings from Last 3 Encounters:  01/07/21 178 lb (80.7 kg)  07/07/20 178 lb (80.7 kg)  06/28/20 175 lb (79.4 kg)    Physical Exam Vitals and nursing note reviewed.  Constitutional:      General: She is not in acute distress.    Appearance: Normal appearance. She is not ill-appearing, toxic-appearing or diaphoretic.  HENT:     Head: Normocephalic and atraumatic.     Right Ear: External ear normal.     Left Ear: External ear normal.     Nose: Nose normal.      Mouth/Throat:     Mouth: Mucous membranes are moist.     Pharynx: Oropharynx is clear.  Eyes:     General: No scleral icterus.       Right eye: No discharge.        Left eye: No discharge.     Extraocular Movements: Extraocular movements intact.     Conjunctiva/sclera: Conjunctivae normal.     Pupils: Pupils are equal, round, and reactive to light.  Cardiovascular:     Rate and Rhythm: Normal rate and regular rhythm.     Pulses: Normal pulses.     Heart sounds: Normal heart sounds. No murmur heard.   No friction rub. No gallop.  Pulmonary:     Effort: Pulmonary effort is normal. No respiratory distress.     Breath sounds: Normal breath sounds. No stridor. No wheezing, rhonchi or rales.  Chest:     Chest wall: No tenderness.  Musculoskeletal:        General: Normal range of motion.     Cervical back: Normal range of motion and neck supple.  Skin:    General: Skin is warm and dry.     Capillary Refill: Capillary refill takes less than 2 seconds.     Coloration: Skin is not jaundiced or pale.     Findings: No bruising, erythema, lesion or rash.  Neurological:     General: No focal deficit present.     Mental Status: She is alert  and oriented to person, place, and time. Mental status is at baseline.  Psychiatric:        Mood and Affect: Mood normal.        Behavior: Behavior normal.        Thought Content: Thought content normal.        Judgment: Judgment normal.    Results for orders placed or performed in visit on 07/07/20  CBC with Differential/Platelet  Result Value Ref Range   WBC 8.8 3.4 - 10.8 x10E3/uL   RBC 3.76 (L) 3.77 - 5.28 x10E6/uL   Hemoglobin 11.3 11.1 - 15.9 g/dL   Hematocrit 34.5 34.0 - 46.6 %   MCV 92 79 - 97 fL   MCH 30.1 26.6 - 33.0 pg   MCHC 32.8 31.5 - 35.7 g/dL   RDW 12.8 11.7 - 15.4 %   Platelets 233 150 - 450 x10E3/uL   Neutrophils 61 Not Estab. %   Lymphs 20 Not Estab. %   Monocytes 10 Not Estab. %   Eos 7 Not Estab. %   Basos 1 Not Estab. %    Neutrophils Absolute 5.4 1.4 - 7.0 x10E3/uL   Lymphocytes Absolute 1.8 0.7 - 3.1 x10E3/uL   Monocytes Absolute 0.9 0.1 - 0.9 x10E3/uL   EOS (ABSOLUTE) 0.6 (H) 0.0 - 0.4 x10E3/uL   Basophils Absolute 0.1 0.0 - 0.2 x10E3/uL   Immature Granulocytes 1 Not Estab. %   Immature Grans (Abs) 0.0 0.0 - 0.1 x10E3/uL  Comprehensive metabolic panel  Result Value Ref Range   Glucose 95 65 - 99 mg/dL   BUN 24 8 - 27 mg/dL   Creatinine, Ser 1.33 (H) 0.57 - 1.00 mg/dL   eGFR 38 (L) >59 mL/min/1.73   BUN/Creatinine Ratio 18 12 - 28   Sodium 141 134 - 144 mmol/L   Potassium 4.5 3.5 - 5.2 mmol/L   Chloride 101 96 - 106 mmol/L   CO2 19 (L) 20 - 29 mmol/L   Calcium 9.6 8.7 - 10.3 mg/dL   Total Protein 6.6 6.0 - 8.5 g/dL   Albumin 4.6 3.6 - 4.6 g/dL   Globulin, Total 2.0 1.5 - 4.5 g/dL   Albumin/Globulin Ratio 2.3 (H) 1.2 - 2.2   Bilirubin Total 0.5 0.0 - 1.2 mg/dL   Alkaline Phosphatase 78 44 - 121 IU/L   AST 17 0 - 40 IU/L   ALT 16 0 - 32 IU/L  Lipid Panel w/o Chol/HDL Ratio  Result Value Ref Range   Cholesterol, Total 138 100 - 199 mg/dL   Triglycerides 224 (H) 0 - 149 mg/dL   HDL 39 (L) >39 mg/dL   VLDL Cholesterol Cal 36 5 - 40 mg/dL   LDL Chol Calc (NIH) 63 0 - 99 mg/dL  Urinalysis, Routine w reflex microscopic  Result Value Ref Range   Specific Gravity, UA 1.015 1.005 - 1.030   pH, UA 6.0 5.0 - 7.5   Color, UA Yellow Yellow   Appearance Ur Clear Clear   Leukocytes,UA Negative Negative   Protein,UA Negative Negative/Trace   Glucose, UA Negative Negative   Ketones, UA Negative Negative   RBC, UA Negative Negative   Bilirubin, UA Negative Negative   Urobilinogen, Ur 0.2 0.2 - 1.0 mg/dL   Nitrite, UA Negative Negative  TSH  Result Value Ref Range   TSH 1.550 0.450 - 4.500 uIU/mL  Microalbumin, Urine Waived  Result Value Ref Range   Microalb, Ur Waived 10 0 - 19 mg/L   Creatinine, Urine Waived 100 10 -  300 mg/dL   Microalb/Creat Ratio <30 <30 mg/g      Assessment & Plan:    Problem List Items Addressed This Visit       Genitourinary   Benign hypertensive renal disease - Primary    Under good control on current regimen. Continue current regimen. Continue to monitor. Call with any concerns. Refills given. Labs drawn today.       Relevant Orders   Comprehensive metabolic panel     Other   Hyperlipidemia    Under good control on current regimen. Continue current regimen. Continue to monitor. Call with any concerns. Refills given. Labs drawn today.       Relevant Medications   simvastatin (ZOCOR) 20 MG tablet   lisinopril (ZESTRIL) 40 MG tablet   indapamide (LOZOL) 2.5 MG tablet   hydrALAZINE (APRESOLINE) 25 MG tablet   Other Relevant Orders   Comprehensive metabolic panel   Lipid Panel w/o Chol/HDL Ratio   Other Visit Diagnoses     Need for immunization against influenza       Relevant Orders   Flu Vaccine QUAD High Dose(Fluad) (Completed)        Follow up plan: Return in about 6 months (around 07/07/2021), or physical.

## 2021-01-07 NOTE — Assessment & Plan Note (Signed)
Under good control on current regimen. Continue current regimen. Continue to monitor. Call with any concerns. Refills given. Labs drawn today.   

## 2021-01-08 LAB — LIPID PANEL W/O CHOL/HDL RATIO
Cholesterol, Total: 134 mg/dL (ref 100–199)
HDL: 45 mg/dL (ref >39)
LDL Chol Calc (NIH): 64 mg/dL (ref 0–99)
Triglycerides: 147 mg/dL (ref 0–149)
VLDL Cholesterol Cal: 25 mg/dL (ref 5–40)

## 2021-01-08 LAB — COMPREHENSIVE METABOLIC PANEL
ALT: 10 IU/L (ref 0–32)
AST: 13 IU/L (ref 0–40)
Albumin/Globulin Ratio: 2.2 (ref 1.2–2.2)
Albumin: 4.7 g/dL — ABNORMAL HIGH (ref 3.6–4.6)
Alkaline Phosphatase: 67 IU/L (ref 44–121)
BUN/Creatinine Ratio: 16 (ref 12–28)
BUN: 21 mg/dL (ref 8–27)
Bilirubin Total: 0.6 mg/dL (ref 0.0–1.2)
CO2: 21 mmol/L (ref 20–29)
Calcium: 9.8 mg/dL (ref 8.7–10.3)
Chloride: 101 mmol/L (ref 96–106)
Creatinine, Ser: 1.34 mg/dL — ABNORMAL HIGH (ref 0.57–1.00)
Globulin, Total: 2.1 g/dL (ref 1.5–4.5)
Glucose: 84 mg/dL (ref 70–99)
Potassium: 5 mmol/L (ref 3.5–5.2)
Sodium: 140 mmol/L (ref 134–144)
Total Protein: 6.8 g/dL (ref 6.0–8.5)
eGFR: 38 mL/min/{1.73_m2} — ABNORMAL LOW (ref >59)

## 2021-01-10 ENCOUNTER — Encounter: Payer: Self-pay | Admitting: Family Medicine

## 2021-05-02 DIAGNOSIS — D2271 Melanocytic nevi of right lower limb, including hip: Secondary | ICD-10-CM | POA: Diagnosis not present

## 2021-05-02 DIAGNOSIS — Z85828 Personal history of other malignant neoplasm of skin: Secondary | ICD-10-CM | POA: Diagnosis not present

## 2021-05-02 DIAGNOSIS — D2261 Melanocytic nevi of right upper limb, including shoulder: Secondary | ICD-10-CM | POA: Diagnosis not present

## 2021-05-02 DIAGNOSIS — C44319 Basal cell carcinoma of skin of other parts of face: Secondary | ICD-10-CM | POA: Diagnosis not present

## 2021-05-02 DIAGNOSIS — D2262 Melanocytic nevi of left upper limb, including shoulder: Secondary | ICD-10-CM | POA: Diagnosis not present

## 2021-06-21 DIAGNOSIS — C44319 Basal cell carcinoma of skin of other parts of face: Secondary | ICD-10-CM | POA: Diagnosis not present

## 2021-06-28 DIAGNOSIS — C44319 Basal cell carcinoma of skin of other parts of face: Secondary | ICD-10-CM | POA: Diagnosis not present

## 2021-06-28 DIAGNOSIS — L905 Scar conditions and fibrosis of skin: Secondary | ICD-10-CM | POA: Diagnosis not present

## 2021-06-30 ENCOUNTER — Ambulatory Visit (INDEPENDENT_AMBULATORY_CARE_PROVIDER_SITE_OTHER): Payer: Medicare Other | Admitting: *Deleted

## 2021-06-30 DIAGNOSIS — Z0000 Encounter for general adult medical examination without abnormal findings: Secondary | ICD-10-CM | POA: Diagnosis not present

## 2021-06-30 NOTE — Progress Notes (Signed)
? ?Subjective:  ? Faith Cox is a 88 y.o. female who presents for Medicare Annual (Subsequent) preventive examination. ? ?I connected with  Faith Cox on 06/30/21 by a telephone enabled telemedicine application and verified that I am speaking with the correct person using two identifiers. ?  ?I discussed the limitations of evaluation and management by telemedicine. The patient expressed understanding and agreed to proceed. ? ?Patient location: home ? ?Provider location: Tele-Health  not in office ? ? ? ?Review of Systems    ? ?Cardiac Risk Factors include: advanced age (>23mn, >>64women);hypertension ? ?   ?Objective:  ?  ?Today's Vitals  ? ?There is no height or weight on file to calculate BMI. ? ? ?  06/30/2021  ?  9:03 AM 06/28/2020  ?  9:00 AM 04/22/2020  ?  4:07 PM 06/06/2016  ?  8:54 AM 06/01/2015  ?  8:33 AM  ?Advanced Directives  ?Does Patient Have a Medical Advance Directive? Yes Yes No No No  ?Type of Advance Directive Healthcare Power of Attorney Living will     ?Does patient want to make changes to medical advance directive? No - Patient declined      ?Copy of HSpartain Chart? No - copy requested      ?Would patient like information on creating a medical advance directive?    Yes (MAU/Ambulatory/Procedural Areas - Information given) Yes - Educational materials given  ? ? ?Current Medications (verified) ?Outpatient Encounter Medications as of 06/30/2021  ?Medication Sig  ? aspirin 81 MG tablet Take 81 mg by mouth daily.  ? Calcium Carb-Cholecalciferol [phone] MG-UNIT CAPS Take by mouth.  ? hydrALAZINE (APRESOLINE) 25 MG tablet TAKE 1 TABLET BY MOUTH IN THE MORNING AND AT BEDTIME  ? indapamide (LOZOL) 2.5 MG tablet Take 1 tablet (2.5 mg total) by mouth daily.  ? IRON PO Take 500 mg by mouth daily.  ? lisinopril (ZESTRIL) 40 MG tablet Take 1 tablet (40 mg total) by mouth daily.  ? Omega-3 Fatty Acids (FISH OIL) 1000 MG CPDR Take by mouth.  ? simvastatin (ZOCOR) 20 MG tablet Take 1  tablet (20 mg total) by mouth at bedtime.  ? vitamin B-12 (CYANOCOBALAMIN) 1000 MCG tablet Take 1,000 mcg by mouth daily.  ? ?No facility-administered encounter medications on file as of 06/30/2021.  ? ? ?Allergies (verified) ?Amlodipine  ? ?History: ?Past Medical History:  ?Diagnosis Date  ? Cancer ([facility]   ? skin  ? Hyperlipidemia   ? Hypertension   ? ?Past Surgical History:  ?Procedure Laterality Date  ? ABDOMINAL HYSTERECTOMY    ? APPENDECTOMY    ? BASAL CELL CARCINOMA EXCISION Right 12/24/2019  ? Right chin  ? BASAL CELL CARCINOMA EXCISION Right 01/22/2020, 01/29/20  ? Right post auricular  ? BASAL CELL CARCINOMA EXCISION  06/30/2020  ? bladder repair    ? BREAST EXCISIONAL BIOPSY Left 08/09/1978  ? neg  ? GALLBLADDER SURGERY    ? TOE SURGERY    ? ?Family History  ?Problem Relation Age of Onset  ? Pancreatic cancer Mother   ? Stroke Father   ? Alzheimer's disease Paternal Grandmother   ? Leukemia Brother   ? Breast cancer Neg Hx   ? ?Social History  ? ?Socioeconomic History  ? Marital status: Married  ?  Spouse name: Not on file  ? Number of children: Not on file  ? Years of education: Not on file  ? Highest education level: Not on file  ?  Occupational History  ? Occupation: retired  ?Tobacco Use  ? Smoking status: Former  ?  Types: Cigarettes  ?  Quit date: 04/11/1979  ?  Years since quitting: 42.2  ? Smokeless tobacco: Never  ? Tobacco comments:  ?  quit 30 years ago   ?Vaping Use  ? Vaping Use: Never used  ?Substance and Sexual Activity  ? Alcohol use: Yes  ?  Alcohol/week: 0.0 standard drinks  ?  Comment: beer on occasion  ? Drug use: No  ? Sexual activity: Not Currently  ?Other Topics Concern  ? Not on file  ?Social History Narrative  ? Not on file  ? ?Social Determinants of Health  ? ?Financial Resource Strain: Low Risk   ? Difficulty of Paying Living Expenses: Not hard at all  ?Food Insecurity: No Food Insecurity  ? Worried About Charity fundraiser in the Last Year: Never true  ? Ran Out of Food in the Last  Year: Never true  ?Transportation Needs: No Transportation Needs  ? Lack of Transportation (Medical): No  ? Lack of Transportation (Non-Medical): No  ?Physical Activity: Sufficiently Active  ? Days of Exercise per Week: 5 days  ? Minutes of Exercise per Session: 50 min  ?Stress: No Stress Concern Present  ? Feeling of Stress : Not at all  ?Social Connections: Socially Isolated  ? Frequency of Communication with Friends and Family: More than three times a week  ? Frequency of Social Gatherings with Friends and Family: Once a week  ? Attends Religious Services: Never  ? Active Member of Clubs or Organizations: No  ? Attends Archivist Meetings: Never  ? Marital Status: Widowed  ? ? ?Tobacco Counseling ?Counseling given: Not Answered ?Tobacco comments: quit 30 years ago  ? ? ?Clinical Intake: ? ?Pre-visit preparation completed: Yes ? ?Pain : No/denies pain ? ?  ? ?Nutritional Risks: None ?Diabetes: No ? ?How often do you need to have someone help you when you read instructions, pamphlets, or other written materials from your doctor or pharmacy?: 1 - Never ? ?Diabetic?  no ? ?Interpreter Needed?: No ? ?Information entered by :: Faith Kennedy LPN ? ? ?Activities of Daily Living ? ?  06/30/2021  ?  9:08 AM 07/07/2020  ?  7:57 AM  ?In your present state of health, do you have any difficulty performing the following activities:  ?Hearing? 1 1  ?Vision? 0 0  ?Difficulty concentrating or making decisions? 0 0  ?Walking or climbing stairs? 0 0  ?Dressing or bathing? 0 0  ?Doing errands, shopping? 0 0  ?Preparing Food and eating ? N   ?Using the Toilet? N   ?In the past six months, have you accidently leaked urine? N   ?Do you have problems with loss of bowel control? N   ?Managing your Medications? N   ?Managing your Finances? N   ? ? ?Patient Care Team: ?Faith Roys, DO as PCP - General (Family Medicine) ?Cox, Faith Char, MD (Dermatology) ? ?Indicate any recent Medical Services you may have received from other than  [facility] providers in the past year (date may be approximate). ? ?   ?Assessment:  ? This is a routine wellness examination for Faith Cox. ? ?Hearing/Vision screen ?Hearing Screening - Comments:: Some slight hearing loss ?No hearing aids ?Vision Screening - Comments:: Not up to date ?[place] ? ?Dietary issues and exercise activities discussed: ?Current Exercise Habits: Home exercise routine, Type of exercise: walking (stationary bike), Time (Minutes): 50, Frequency (  Times/Week): 5, Weekly Exercise (Minutes/Week): 250, Intensity: Moderate ? ? Goals Addressed   ? ?  ?  ?  ?  ? This Visit's Progress  ?  Patient Stated     ?  No goals ?  ? ?  ? ?Depression Screen ? ?  06/30/2021  ?  9:14 AM 07/07/2020  ?  7:57 AM 06/28/2020  ?  9:01 AM 06/23/2019  ?  9:54 AM 06/18/2018  ? 10:03 AM 06/15/2017  ?  9:34 AM 06/06/2016  ?  8:31 AM  ?PHQ 2/9 Scores  ?PHQ - 2 Score 0 0 0 0 0 0 0  ?PHQ- 9 Score  1  0 0 1   ?  ?Fall Risk ? ?  06/30/2021  ?  9:03 AM 07/07/2020  ?  7:56 AM 06/28/2020  ?  9:01 AM 06/23/2019  ?  9:54 AM 06/18/2018  ? 10:03 AM  ?Fall Risk   ?Falls in the past year? 0 0 0 0 0  ?Number falls in past yr: 0 0  0 0  ?Injury with Fall? 0 0  0 0  ?Risk for fall due to :  No Fall Risks Medication side effect    ?Follow up Falls evaluation completed;Falls prevention discussed Falls evaluation completed Falls evaluation completed;Education provided;Falls prevention discussed    ? ? ?FALL RISK PREVENTION PERTAINING TO THE HOME: ? ?Any stairs in or around the home? No  ?If so, are there any without handrails? No  ?Home free of loose throw rugs in walkways, pet beds, electrical cords, etc? Yes  ?Adequate lighting in your home to reduce risk of falls? Yes  ? ?ASSISTIVE DEVICES UTILIZED TO PREVENT FALLS: ? ?Life alert? No  ?Use of a cane, walker or w/c? No   ?Grab bars in the bathroom? Yes  ?Shower chair or bench in shower? No  ?Elevated toilet seat or a handicapped toilet? Yes  ? ?TIMED UP AND GO: ? ?Was the test performed? No .  ? ? ?Cognitive  Function: ? ?Normal cognitive status assessed by direct observation by this Nurse Health Advisor. No abnormalities found.   ?  ?  ? ?  06/28/2020  ?  9:03 AM 06/23/2019  ?  9:43 AM 06/18/2018  ?  9:35 AM 3/8/201

## 2021-06-30 NOTE — Patient Instructions (Signed)
Faith Cox , ?Thank you for taking time to come for your Medicare Wellness Visit. I appreciate your ongoing commitment to your health goals. Please review the following plan we discussed and let me know if I can assist you in the future.  ? ?Screening recommendations/referrals: ?Colonoscopy: no longer required ?Mammogram: no longer required ?Bone Density: Declined ?Recommended yearly ophthalmology/optometry visit for glaucoma screening and checkup ?Recommended yearly dental visit for hygiene and checkup ? ?Vaccinations: ?Influenza vaccine: up to date ?Pneumococcal vaccine: up to date ?Tdap vaccine: up to date ?Shingles vaccine: Declined   ? ?Advanced directives: copy requested ? ?Conditions/risks identified:  ? ?Next appointment: 07-07-2021 @ 8:00 Faith Cox ? ? ?Preventive Care 86 Years and Older, Female ?Preventive care refers to lifestyle choices and visits with your health care provider that can promote health and wellness. ?What does preventive care include? ?A yearly physical exam. This is also called an annual well check. ?Dental exams once or twice a year. ?Routine eye exams. Ask your health care provider how often you should have your eyes checked. ?Personal lifestyle choices, including: ?Daily care of your teeth and gums. ?Regular physical activity. ?Eating a healthy diet. ?Avoiding tobacco and drug use. ?Limiting alcohol use. ?Practicing safe sex. ?Taking low-dose aspirin every day. ?Taking vitamin and mineral supplements as recommended by your health care provider. ?What happens during an annual well check? ?The services and screenings done by your health care provider during your annual well check will depend on your age, overall health, lifestyle risk factors, and family history of disease. ?Counseling  ?Your health care provider may ask you questions about your: ?Alcohol use. ?Tobacco use. ?Drug use. ?Emotional well-being. ?Home and relationship well-being. ?Sexual activity. ?Eating habits. ?History of  falls. ?Memory and ability to understand (cognition). ?Work and work Statistician. ?Reproductive health. ?Screening  ?You may have the following tests or measurements: ?Height, weight, and BMI. ?Blood pressure. ?Lipid and cholesterol levels. These may be checked every 5 years, or more frequently if you are over 86 years old. ?Skin check. ?Lung cancer screening. You may have this screening every year starting at age 86 if you have a 30-pack-year history of smoking and currently smoke or have quit within the past 15 years. ?Fecal occult blood test (FOBT) of the stool. You may have this test every year starting at age 86. ?Flexible sigmoidoscopy or colonoscopy. You may have a sigmoidoscopy every 5 years or a colonoscopy every 10 years starting at age 86. ?Hepatitis C blood test. ?Hepatitis B blood test. ?Sexually transmitted disease (STD) testing. ?Diabetes screening. This is done by checking your blood sugar (glucose) after you have not eaten for a while (fasting). You may have this done every 1-3 years. ?Bone density scan. This is done to screen for osteoporosis. You may have this done starting at age 86. ?Mammogram. This may be done every 1-2 years. Talk to your health care provider about how often you should have regular mammograms. ?Talk with your health care provider about your test results, treatment options, and if necessary, the need for more tests. ?Vaccines  ?Your health care provider may recommend certain vaccines, such as: ?Influenza vaccine. This is recommended every year. ?Tetanus, diphtheria, and acellular pertussis (Tdap, Td) vaccine. You may need a Td booster every 10 years. ?Zoster vaccine. You may need this after age 86. ?Pneumococcal 13-valent conjugate (PCV13) vaccine. One dose is recommended after age 86. ?Pneumococcal polysaccharide (PPSV23) vaccine. One dose is recommended after age 86. ?Talk to your health care provider about which screenings  and vaccines you need and how often you need  them. ?This information is not intended to replace advice given to you by your health care provider. Make sure you discuss any questions you have with your health care provider. ?Document Released: 04/23/2015 Document Revised: 12/15/2015 Document Reviewed: 01/26/2015 ?Elsevier Interactive Patient Education ? 2017 [employer]. ? ?Fall Prevention in the Home ?Falls can cause injuries. They can happen to people of all ages. There are many things you can do to make your home safe and to help prevent falls. ?What can I do on the outside of my home? ?Regularly fix the edges of walkways and driveways and fix any cracks. ?Remove anything that might make you trip as you walk through a door, such as a raised step or threshold. ?Trim any bushes or trees on the path to your home. ?Use bright outdoor lighting. ?Clear any walking paths of anything that might make someone trip, such as rocks or tools. ?Regularly check to see if handrails are loose or broken. Make sure that both sides of any steps have handrails. ?Any raised decks and porches should have guardrails on the edges. ?Have any leaves, snow, or ice cleared regularly. ?Use sand or salt on walking paths during winter. ?Clean up any spills in your garage right away. This includes oil or grease spills. ?What can I do in the bathroom? ?Use night lights. ?Install grab bars by the toilet and in the tub and shower. Do not use towel bars as grab bars. ?Use non-skid mats or decals in the tub or shower. ?If you need to sit down in the shower, use a plastic, non-slip stool. ?Keep the floor dry. Clean up any water that spills on the floor as soon as it happens. ?Remove soap buildup in the tub or shower regularly. ?Attach bath mats securely with double-sided non-slip rug tape. ?Do not have throw rugs and other things on the floor that can make you trip. ?What can I do in the bedroom? ?Use night lights. ?Make sure that you have a light by your bed that is easy to reach. ?Do not use  any sheets or blankets that are too big for your bed. They should not hang down onto the floor. ?Have a firm chair that has side arms. You can use this for support while you get dressed. ?Do not have throw rugs and other things on the floor that can make you trip. ?What can I do in the kitchen? ?Clean up any spills right away. ?Avoid walking on wet floors. ?Keep items that you use a lot in easy-to-reach places. ?If you need to reach something above you, use a strong step stool that has a grab bar. ?Keep electrical cords out of the way. ?Do not use floor polish or wax that makes floors slippery. If you must use wax, use non-skid floor wax. ?Do not have throw rugs and other things on the floor that can make you trip. ?What can I do with my stairs? ?Do not leave any items on the stairs. ?Make sure that there are handrails on both sides of the stairs and use them. Fix handrails that are broken or loose. Make sure that handrails are as long as the stairways. ?Check any carpeting to make sure that it is firmly attached to the stairs. Fix any carpet that is loose or worn. ?Avoid having throw rugs at the top or bottom of the stairs. If you do have throw rugs, attach them to the floor with carpet tape. ?  Make sure that you have a light switch at the top of the stairs and the bottom of the stairs. If you do not have them, ask someone to add them for you. ?What else can I do to help prevent falls? ?Wear shoes that: ?Do not have high heels. ?Have rubber bottoms. ?Are comfortable and fit you well. ?Are closed at the toe. Do not wear sandals. ?If you use a stepladder: ?Make sure that it is fully opened. Do not climb a closed stepladder. ?Make sure that both sides of the stepladder are locked into place. ?Ask someone to hold it for you, if possible. ?Clearly mark and make sure that you can see: ?Any grab bars or handrails. ?First and last steps. ?Where the edge of each step is. ?Use tools that help you move around (mobility aids)  if they are needed. These include: ?Canes. ?Walkers. ?Scooters. ?Crutches. ?Turn on the lights when you go into a dark area. Replace any light bulbs as soon as they burn out. ?Set up your furniture so you have a c

## 2021-07-01 ENCOUNTER — Ambulatory Visit: Payer: Medicare Other

## 2021-07-07 ENCOUNTER — Ambulatory Visit: Payer: Medicare Other | Admitting: Family Medicine

## 2021-07-07 ENCOUNTER — Encounter: Payer: Self-pay | Admitting: Family Medicine

## 2021-07-07 VITALS — BP 133/74 | HR 71 | Temp 97.9°F | Wt 170.8 lb

## 2021-07-07 DIAGNOSIS — D692 Other nonthrombocytopenic purpura: Secondary | ICD-10-CM

## 2021-07-07 DIAGNOSIS — I700 Atherosclerosis of aorta: Secondary | ICD-10-CM | POA: Diagnosis not present

## 2021-07-07 DIAGNOSIS — I129 Hypertensive chronic kidney disease with stage 1 through stage 4 chronic kidney disease, or unspecified chronic kidney disease: Secondary | ICD-10-CM | POA: Diagnosis not present

## 2021-07-07 DIAGNOSIS — E782 Mixed hyperlipidemia: Secondary | ICD-10-CM

## 2021-07-07 MED ORDER — HYDRALAZINE HCL 25 MG PO TABS: ORAL_TABLET | ORAL | 1 refills | Status: DC

## 2021-07-07 MED ORDER — LISINOPRIL 40 MG PO TABS: 40.0000 mg | ORAL_TABLET | Freq: Every day | ORAL | 1 refills | Status: DC

## 2021-07-07 MED ORDER — SIMVASTATIN 20 MG PO TABS: 20.0000 mg | ORAL_TABLET | Freq: Every day | ORAL | 1 refills | Status: DC

## 2021-07-07 MED ORDER — INDAPAMIDE 2.5 MG PO TABS: 2.5000 mg | ORAL_TABLET | Freq: Every day | ORAL | 1 refills | Status: DC

## 2021-07-07 NOTE — Assessment & Plan Note (Signed)
Stable.       - Continue to monitor

## 2021-07-07 NOTE — Assessment & Plan Note (Signed)
Under good control on current regimen. Continue current regimen. Continue to monitor. Call with any concerns. Refills given. Labs drawn today.   

## 2021-07-07 NOTE — Assessment & Plan Note (Signed)
Will keep BP and cholesterol under good control. Call with any concerns.  ?

## 2021-07-07 NOTE — Progress Notes (Signed)
? ?BP 133/74   Pulse 71   Temp 97.9 ?F (36.6 ?C)   Wt 170 lb 12.8 oz (77.5 kg)   SpO2 100%   BMI 29.32 kg/m?   ? ?Subjective:  ? ? Patient ID: Faith Cox, female    DOB: June 20, 1929, 88 y.o.   MRN: [id number] ? ?HPI: ?Faith Cox is a 88 y.o. female ? ?Chief Complaint  ?Patient presents with  ? Hypertension  ? Hyperlipidemia  ? ?HYPERTENSION / HYPERLIPIDEMIA ?Satisfied with current treatment? yes ?Duration of hypertension: chronic ?BP monitoring frequency: not checking ?BP medication side effects: no ?Past BP meds: lisinopril, indapamide, hyralazine ?Duration of hyperlipidemia: chronic ?Cholesterol medication side effects: no ?Cholesterol supplements: fish oil ?Past cholesterol medications: simvastatin ?Medication compliance: excellent compliance ?Aspirin: yes ?Recent stressors: no ?Recurrent headaches: no ?Visual changes: no ?Palpitations: no ?Dyspnea: no ?Chest pain: no ?Lower extremity edema: no ?Dizzy/lightheaded: no ? ?Relevant past medical, surgical, family and social history reviewed and updated as indicated. Interim medical history since our last visit reviewed. ?Allergies and medications reviewed and updated. ? ?Review of Systems  ?Constitutional: Negative.   ?Respiratory: Negative.    ?Cardiovascular: Negative.   ?Gastrointestinal: Negative.   ?Musculoskeletal: Negative.   ?Psychiatric/Behavioral: Negative.    ? ?Per HPI unless specifically indicated above ? ?   ?Objective:  ?  ?BP 133/74   Pulse 71   Temp 97.9 ?F (36.6 ?C)   Wt 170 lb 12.8 oz (77.5 kg)   SpO2 100%   BMI 29.32 kg/m?   ?Wt Readings from Last 3 Encounters:  ?07/07/21 170 lb 12.8 oz (77.5 kg)  ?01/07/21 178 lb (80.7 kg)  ?07/07/20 178 lb (80.7 kg)  ?  ?Physical Exam ?Vitals and nursing note reviewed.  ?Constitutional:   ?   General: She is not in acute distress. ?   Appearance: Normal appearance. She is not ill-appearing, toxic-appearing or diaphoretic.  ?HENT:  ?   Head: Normocephalic and atraumatic.  ?   Right Ear: External ear  normal.  ?   Left Ear: External ear normal.  ?   Nose: Nose normal.  ?   Mouth/Throat:  ?   Mouth: Mucous membranes are moist.  ?   Pharynx: Oropharynx is clear.  ?Eyes:  ?   General: No scleral icterus.    ?   Right eye: No discharge.     ?   Left eye: No discharge.  ?   Extraocular Movements: Extraocular movements intact.  ?   Conjunctiva/sclera: Conjunctivae normal.  ?   Pupils: Pupils are equal, round, and reactive to light.  ?Cardiovascular:  ?   Rate and Rhythm: Normal rate and regular rhythm.  ?   Pulses: Normal pulses.  ?   Heart sounds: Normal heart sounds. No murmur heard. ?  No friction rub. No gallop.  ?Pulmonary:  ?   Effort: Pulmonary effort is normal. No respiratory distress.  ?   Breath sounds: Normal breath sounds. No stridor. No wheezing, rhonchi or rales.  ?Chest:  ?   Chest wall: No tenderness.  ?Musculoskeletal:     ?   General: Normal range of motion.  ?   Cervical back: Normal range of motion and neck supple.  ?Skin: ?   General: Skin is warm and dry.  ?   Capillary Refill: Capillary refill takes less than 2 seconds.  ?   Coloration: Skin is not jaundiced or pale.  ?   Findings: No bruising, erythema, lesion or rash.  ?Neurological:  ?  General: No focal deficit present.  ?   Mental Status: She is alert and oriented to person, place, and time. Mental status is at baseline.  ?Psychiatric:     ?   Mood and Affect: Mood normal.     ?   Behavior: Behavior normal.     ?   Thought Content: Thought content normal.     ?   Judgment: Judgment normal.  ? ? ?Results for orders placed or performed in visit on 01/07/21  ?Comprehensive metabolic panel  ?Result Value Ref Range  ? Glucose 84 70 - 99 mg/dL  ? BUN 21 8 - 27 mg/dL  ? Creatinine, Ser 1.34 (H) 0.57 - 1.00 mg/dL  ? eGFR 38 (L) >59 mL/min/1.73  ? BUN/Creatinine Ratio 16 12 - 28  ? Sodium 140 134 - 144 mmol/L  ? Potassium 5.0 3.5 - 5.2 mmol/L  ? Chloride 101 96 - 106 mmol/L  ? CO2 21 20 - 29 mmol/L  ? Calcium 9.8 8.7 - 10.3 mg/dL  ? Total Protein  6.8 6.0 - 8.5 g/dL  ? Albumin 4.7 (H) 3.6 - 4.6 g/dL  ? Globulin, Total 2.1 1.5 - 4.5 g/dL  ? Albumin/Globulin Ratio 2.2 1.2 - 2.2  ? Bilirubin Total 0.6 0.0 - 1.2 mg/dL  ? Alkaline Phosphatase 67 44 - 121 IU/L  ? AST 13 0 - 40 IU/L  ? ALT 10 0 - 32 IU/L  ?Lipid Panel w/o Chol/HDL Ratio  ?Result Value Ref Range  ? Cholesterol, Total 134 100 - 199 mg/dL  ? Triglycerides 147 0 - 149 mg/dL  ? HDL 45 >39 mg/dL  ? VLDL Cholesterol Cal 25 5 - 40 mg/dL  ? LDL Chol Calc (NIH) 64 0 - 99 mg/dL  ? ?   ?Assessment & Plan:  ? ?Problem List Items Addressed This Visit   ? ?  ? Cardiovascular and Mediastinum  ? Senile purpura ([place])  ?  Stable. Continue to monitor.  ?  ?  ? Relevant Medications  ? simvastatin (ZOCOR) 20 MG tablet  ? lisinopril (ZESTRIL) 40 MG tablet  ? indapamide (LOZOL) 2.5 MG tablet  ? hydrALAZINE (APRESOLINE) 25 MG tablet  ? Aortic atherosclerosis ([place])  ?  Will keep BP and cholesterol under good control. Call with any concerns.  ?  ?  ? Relevant Medications  ? simvastatin (ZOCOR) 20 MG tablet  ? lisinopril (ZESTRIL) 40 MG tablet  ? indapamide (LOZOL) 2.5 MG tablet  ? hydrALAZINE (APRESOLINE) 25 MG tablet  ?  ? Genitourinary  ? Benign hypertensive renal disease  ?  Under good control on current regimen. Continue current regimen. Continue to monitor. Call with any concerns. Refills given. Labs drawn today. ? ?  ?  ? Relevant Orders  ? Comprehensive metabolic panel  ?  ? Other  ? Hyperlipidemia - Primary  ?  Under good control on current regimen. Continue current regimen. Continue to monitor. Call with any concerns. Refills given. Labs drawn today. ? ?  ?  ? Relevant Medications  ? simvastatin (ZOCOR) 20 MG tablet  ? lisinopril (ZESTRIL) 40 MG tablet  ? indapamide (LOZOL) 2.5 MG tablet  ? hydrALAZINE (APRESOLINE) 25 MG tablet  ? Other Relevant Orders  ? Comprehensive metabolic panel  ? Lipid Panel w/o Chol/HDL Ratio  ?  ? ?Follow up plan: ?Return in about 6 months (around 01/07/2022) for physical. ? ? ? ? ? ?

## 2021-07-08 ENCOUNTER — Encounter: Payer: Self-pay | Admitting: Family Medicine

## 2021-07-08 LAB — COMPREHENSIVE METABOLIC PANEL
ALT: 12 IU/L (ref 0–32)
AST: 16 IU/L (ref 0–40)
Albumin/Globulin Ratio: 2.2 (ref 1.2–2.2)
Albumin: 4.3 g/dL (ref 3.6–4.6)
Alkaline Phosphatase: 81 IU/L (ref 44–121)
BUN/Creatinine Ratio: 18 (ref 12–28)
BUN: 21 mg/dL (ref 8–27)
Bilirubin Total: 0.3 mg/dL (ref 0.0–1.2)
CO2: 23 mmol/L (ref 20–29)
Calcium: 9.7 mg/dL (ref 8.7–10.3)
Chloride: 103 mmol/L (ref 96–106)
Creatinine, Ser: 1.2 mg/dL — ABNORMAL HIGH (ref 0.57–1.00)
Globulin, Total: 2 g/dL (ref 1.5–4.5)
Glucose: 95 mg/dL (ref 70–99)
Potassium: 4.6 mmol/L (ref 3.5–5.2)
Sodium: 140 mmol/L (ref 134–144)
Total Protein: 6.3 g/dL (ref 6.0–8.5)
eGFR: 43 mL/min/{1.73_m2} — ABNORMAL LOW (ref >59)

## 2021-07-08 LAB — LIPID PANEL W/O CHOL/HDL RATIO
Cholesterol, Total: 139 mg/dL (ref 100–199)
HDL: 47 mg/dL (ref >39)
LDL Chol Calc (NIH): 66 mg/dL (ref 0–99)
Triglycerides: 154 mg/dL — ABNORMAL HIGH (ref 0–149)
VLDL Cholesterol Cal: 26 mg/dL (ref 5–40)

## 2021-08-10 ENCOUNTER — Ambulatory Visit (INDEPENDENT_AMBULATORY_CARE_PROVIDER_SITE_OTHER): Payer: Medicare Other | Admitting: Family Medicine

## 2021-08-10 ENCOUNTER — Encounter: Payer: Self-pay | Admitting: Family Medicine

## 2021-08-10 VITALS — BP 147/71 | HR 74 | Temp 97.6°F | Wt 172.6 lb

## 2021-08-10 DIAGNOSIS — M25511 Pain in right shoulder: Secondary | ICD-10-CM

## 2021-08-10 MED ORDER — TRIAMCINOLONE ACETONIDE 40 MG/ML IJ SUSP
40.0000 mg | Freq: Once | INTRAMUSCULAR | Status: AC
  Administered 2021-08-10: 40 mg via INTRAMUSCULAR

## 2021-08-10 MED ORDER — PREDNISONE 10 MG PO TABS: ORAL_TABLET | ORAL | 0 refills | Status: DC

## 2021-08-10 NOTE — Progress Notes (Signed)
? ?BP (!) 147/71   Pulse 74   Temp 97.6 ?F (36.4 ?C)   Wt 172 lb 9.6 oz (78.3 kg)   SpO2 98%   BMI 29.63 kg/m?   ? ?Subjective:  ? ? Patient ID: Faith Cox, female    DOB: 06-06-1929, 88 y.o.   MRN: [id number] ? ?HPI: ?Faith Cox is a 88 y.o. female ? ?Chief Complaint  ?Patient presents with  ? Arm Pain  ?  Patient states her right arm and shoulder have been hurting since Friday. Patient is unable to lift her arm to reach over her head.   ? ?SHOULDER PAIN ?Duration: 5 days ?Involved shoulder: right ?Mechanism of injury: unknown ?Location: lateral ?Onset:sudden ?Severity: severe  ?Quality:  dull  ?Frequency: constant ?Radiation: down into her arm ?Aggravating factors: movement  ?Alleviating factors: nothing  ?Status: stable ?Treatments attempted: tylenol, heat  ?Relief with NSAIDs?:  mild ?Weakness: no ?Numbness: no ?Decreased grip strength: no ?Redness: no ?Swelling: no ?Bruising: no ?Fevers: no ? ?Relevant past medical, surgical, family and social history reviewed and updated as indicated. Interim medical history since our last visit reviewed. ?Allergies and medications reviewed and updated. ? ?Review of Systems ? ?Per HPI unless specifically indicated above ? ?   ?Objective:  ?  ?BP (!) 147/71   Pulse 74   Temp 97.6 ?F (36.4 ?C)   Wt 172 lb 9.6 oz (78.3 kg)   SpO2 98%   BMI 29.63 kg/m?   ?Wt Readings from Last 3 Encounters:  ?08/10/21 172 lb 9.6 oz (78.3 kg)  ?07/07/21 170 lb 12.8 oz (77.5 kg)  ?01/07/21 178 lb (80.7 kg)  ?  ?Physical Exam ?Vitals and nursing note reviewed.  ?Constitutional:   ?   General: She is not in acute distress. ?   Appearance: Normal appearance. She is not ill-appearing, toxic-appearing or diaphoretic.  ?HENT:  ?   Head: Normocephalic and atraumatic.  ?   Right Ear: External ear normal.  ?   Left Ear: External ear normal.  ?   Nose: Nose normal.  ?   Mouth/Throat:  ?   Mouth: Mucous membranes are moist.  ?   Pharynx: Oropharynx is clear.  ?Eyes:  ?   General: No scleral  icterus.    ?   Right eye: No discharge.     ?   Left eye: No discharge.  ?   Extraocular Movements: Extraocular movements intact.  ?   Conjunctiva/sclera: Conjunctivae normal.  ?   Pupils: Pupils are equal, round, and reactive to light.  ?Cardiovascular:  ?   Rate and Rhythm: Normal rate and regular rhythm.  ?   Pulses: Normal pulses.  ?   Heart sounds: Normal heart sounds. No murmur heard. ?  No friction rub. No gallop.  ?Pulmonary:  ?   Effort: Pulmonary effort is normal. No respiratory distress.  ?   Breath sounds: Normal breath sounds. No stridor. No wheezing, rhonchi or rales.  ?Chest:  ?   Chest wall: No tenderness.  ?Musculoskeletal:     ?   General: Tenderness (over deltoid on the R) present. Normal range of motion.  ?   Cervical back: Normal range of motion and neck supple.  ?Skin: ?   General: Skin is warm and dry.  ?   Capillary Refill: Capillary refill takes less than 2 seconds.  ?   Coloration: Skin is not jaundiced or pale.  ?   Findings: No bruising, erythema, lesion or rash.  ?Neurological:  ?  General: No focal deficit present.  ?   Mental Status: She is alert and oriented to person, place, and time. Mental status is at baseline.  ?Psychiatric:     ?   Mood and Affect: Mood normal.     ?   Behavior: Behavior normal.     ?   Thought Content: Thought content normal.     ?   Judgment: Judgment normal.  ? ? ?Results for orders placed or performed in visit on 07/07/21  ?Comprehensive metabolic panel  ?Result Value Ref Range  ? Glucose 95 70 - 99 mg/dL  ? BUN 21 8 - 27 mg/dL  ? Creatinine, Ser 1.20 (H) 0.57 - 1.00 mg/dL  ? eGFR 43 (L) >59 mL/min/1.73  ? BUN/Creatinine Ratio 18 12 - 28  ? Sodium 140 134 - 144 mmol/L  ? Potassium 4.6 3.5 - 5.2 mmol/L  ? Chloride 103 96 - 106 mmol/L  ? CO2 23 20 - 29 mmol/L  ? Calcium 9.7 8.7 - 10.3 mg/dL  ? Total Protein 6.3 6.0 - 8.5 g/dL  ? Albumin 4.3 3.6 - 4.6 g/dL  ? Globulin, Total 2.0 1.5 - 4.5 g/dL  ? Albumin/Globulin Ratio 2.2 1.2 - 2.2  ? Bilirubin Total 0.3 0.0  - 1.2 mg/dL  ? Alkaline Phosphatase 81 44 - 121 IU/L  ? AST 16 0 - 40 IU/L  ? ALT 12 0 - 32 IU/L  ?Lipid Panel w/o Chol/HDL Ratio  ?Result Value Ref Range  ? Cholesterol, Total 139 100 - 199 mg/dL  ? Triglycerides 154 (H) 0 - 149 mg/dL  ? HDL 47 >39 mg/dL  ? VLDL Cholesterol Cal 26 5 - 40 mg/dL  ? LDL Chol Calc (NIH) 66 0 - 99 mg/dL  ? ?   ?Assessment & Plan:  ? ?Problem List Items Addressed This Visit   ?None ?Visit Diagnoses   ? ? Acute pain of right shoulder    -  Primary  ? Will treat with triamcinalone followed by prednisone and stretches. Call if not getting better or getting worse. Call with any concerns.   ? Relevant Medications  ? triamcinolone acetonide (KENALOG-40) injection 40 mg (Start on 08/10/2021  1:30 PM)  ? ?  ?  ? ?Follow up plan: ?Return if symptoms worsen or fail to improve. ? ? ? ? ? ?

## 2021-09-21 ENCOUNTER — Other Ambulatory Visit: Payer: Self-pay | Admitting: Family Medicine

## 2021-09-21 DIAGNOSIS — Z1231 Encounter for screening mammogram for malignant neoplasm of breast: Secondary | ICD-10-CM

## 2021-10-30 IMAGING — MG DIGITAL SCREENING BILAT W/ TOMO W/ CAD
8 series · 8 of 24 positions shown · non-contrast
Comparison: Previous exam(s).

CLINICAL DATA: Screening.

EXAM:
DIGITAL SCREENING BILATERAL MAMMOGRAM WITH TOMO AND CAD

[R MLO synth-2D]
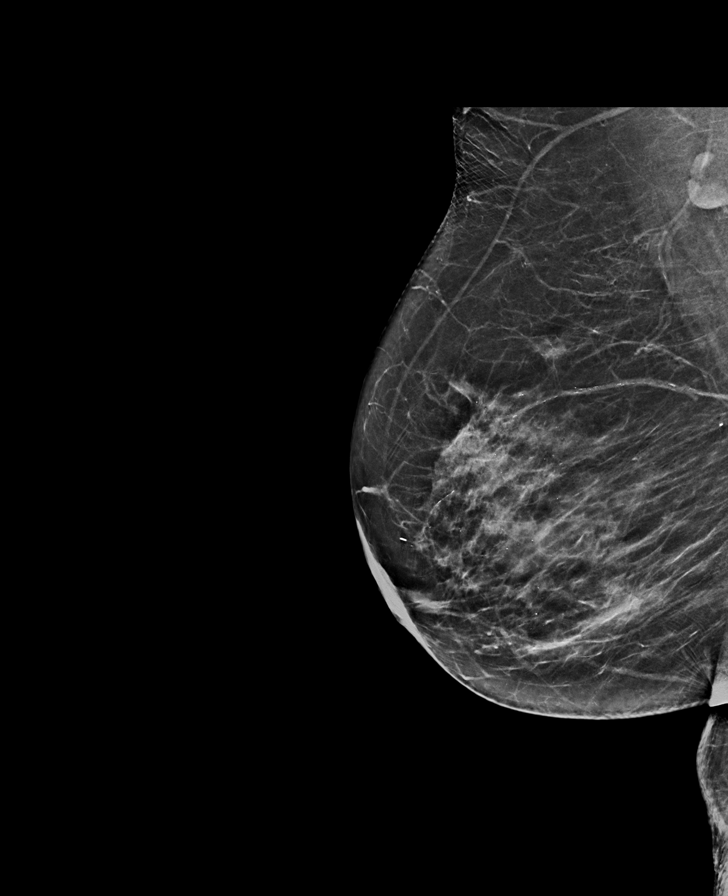

[L MLO synth-2D]
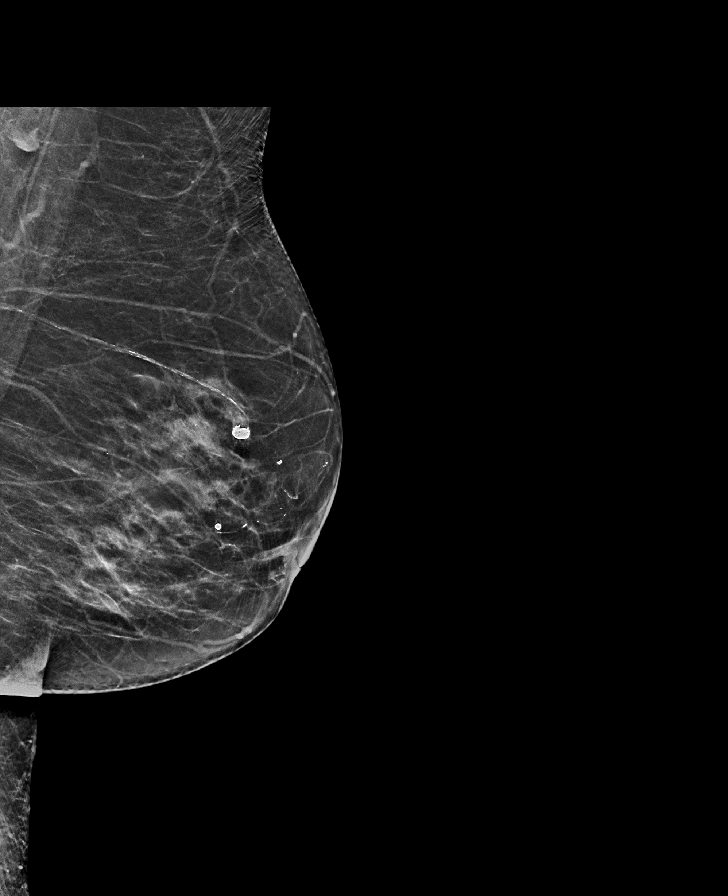

[R CC synth-2D]
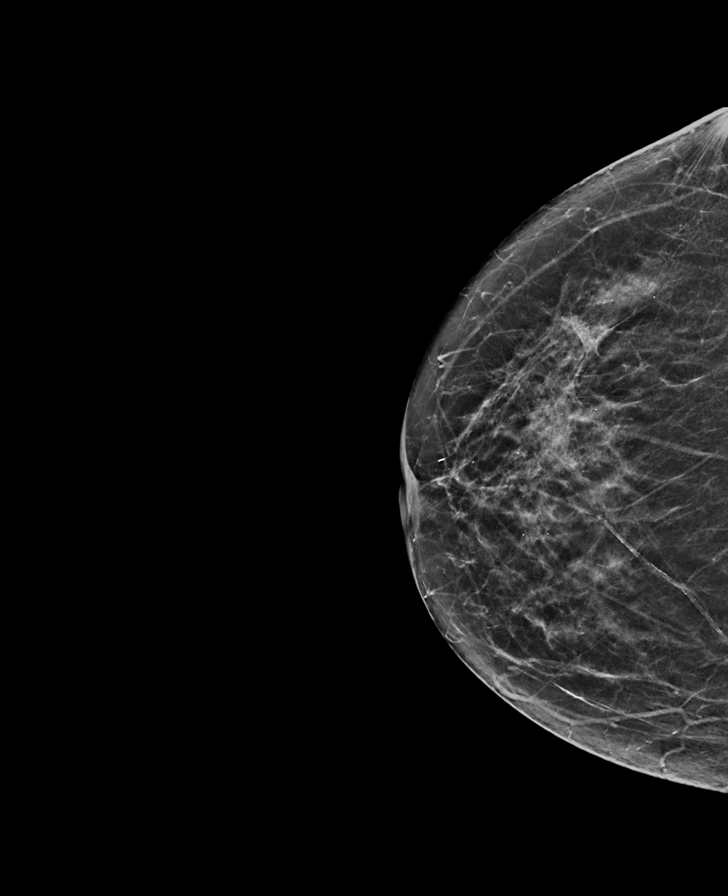

[L CC synth-2D]
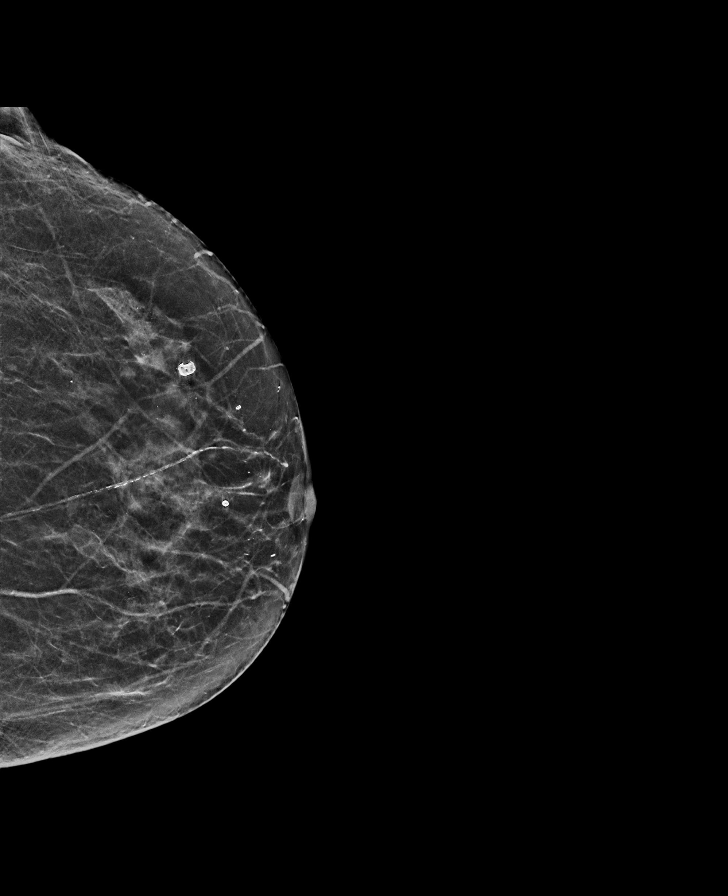

[L CC tomo · tomo slice 27/53.0]
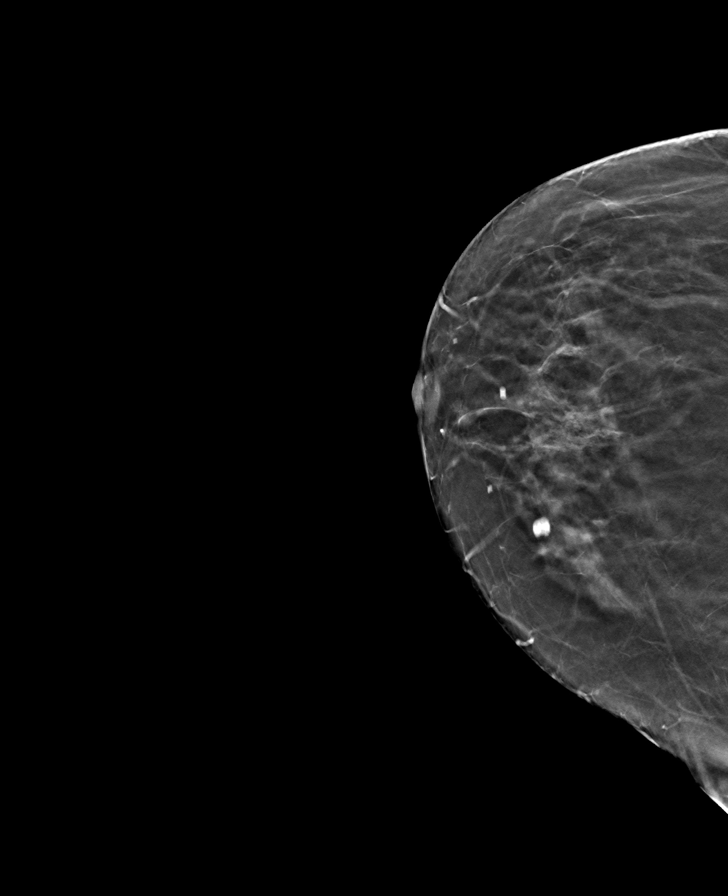

[L MLO tomo · tomo slice 29/58.0]
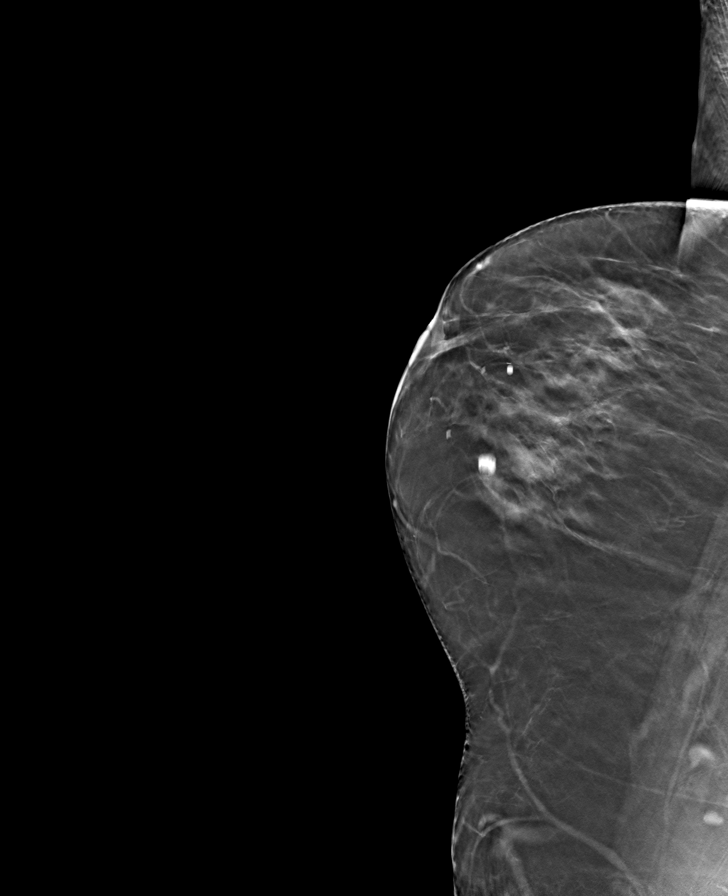

[R CC tomo · tomo slice 27/52.0]
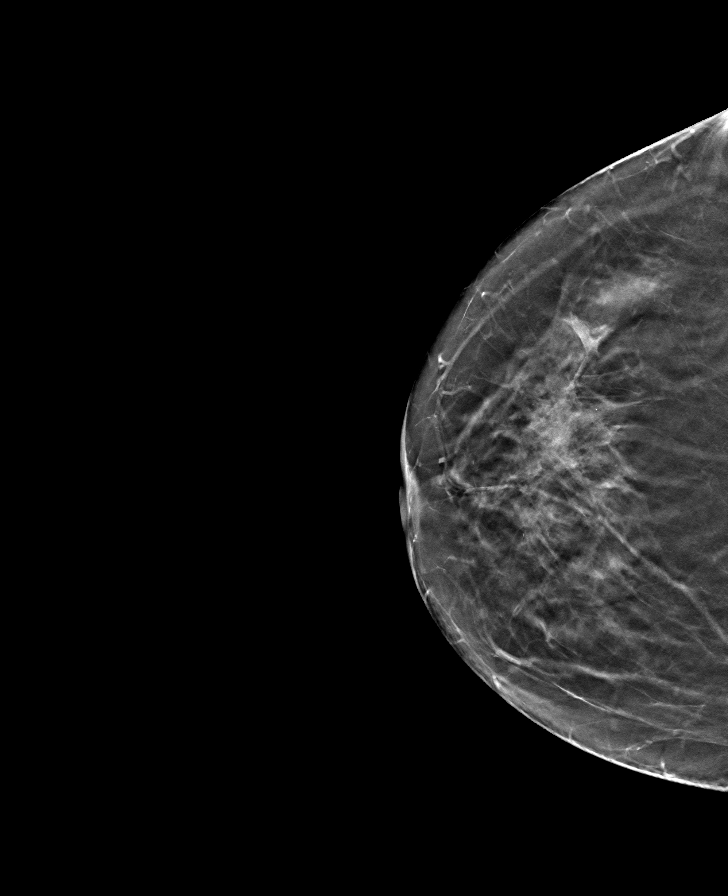

[R MLO tomo · tomo slice 32/63.0]
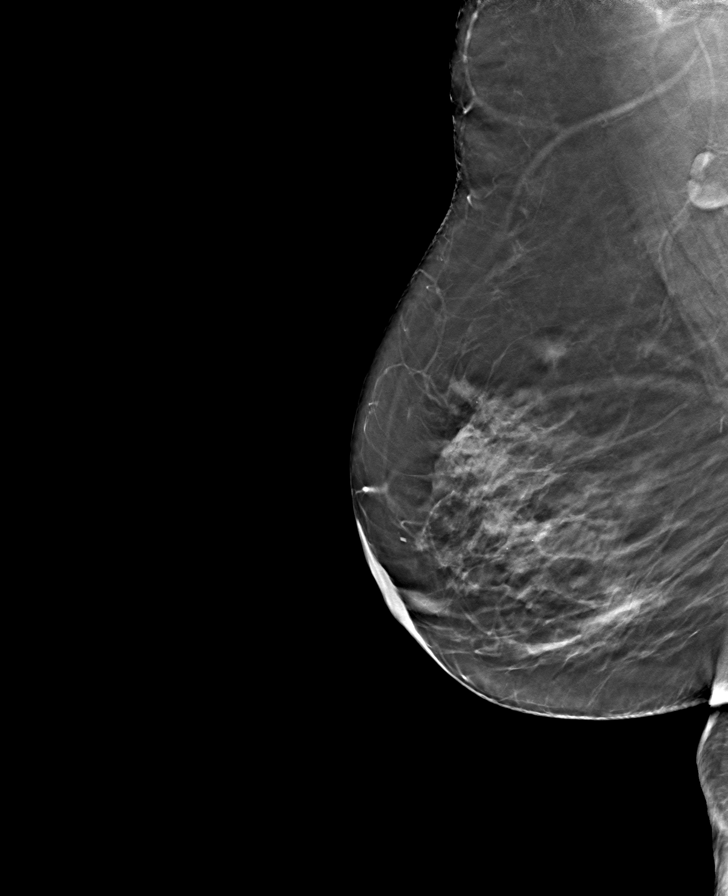

[8 of 24 positions shown; findings below may reference images not displayed]

ACR Breast Density Category c: The breast tissue is heterogeneously
dense, which may obscure small masses.
FINDINGS: There are no findings suspicious for malignancy. Images were
processed with CAD.
IMPRESSION: No mammographic evidence of malignancy. A result letter of this
screening mammogram will be mailed directly to the patient.

RECOMMENDATION:
Screening mammogram in one year. (Code:[id number])

BI-RADS CATEGORY  1: Negative.

## 2021-11-01 ENCOUNTER — Ambulatory Visit
Admission: RE | Admit: 2021-11-01 | Discharge: 2021-11-01 | Disposition: A | Discharge status: Home / Self Care | Payer: Medicare Other | Source: Ambulatory Visit | Attending: Family Medicine | Admitting: Family Medicine

## 2021-11-01 DIAGNOSIS — Z1231 Encounter for screening mammogram for malignant neoplasm of breast: Secondary | ICD-10-CM

## 2021-11-02 ENCOUNTER — Encounter: Payer: Self-pay | Admitting: Family Medicine

## 2021-11-08 DIAGNOSIS — D2261 Melanocytic nevi of right upper limb, including shoulder: Secondary | ICD-10-CM | POA: Diagnosis not present

## 2021-11-08 DIAGNOSIS — D2262 Melanocytic nevi of left upper limb, including shoulder: Secondary | ICD-10-CM | POA: Diagnosis not present

## 2021-11-08 DIAGNOSIS — D044 Carcinoma in situ of skin of scalp and neck: Secondary | ICD-10-CM | POA: Diagnosis not present

## 2021-11-08 DIAGNOSIS — Z85828 Personal history of other malignant neoplasm of skin: Secondary | ICD-10-CM | POA: Diagnosis not present

## 2021-11-08 DIAGNOSIS — D2272 Melanocytic nevi of left lower limb, including hip: Secondary | ICD-10-CM | POA: Diagnosis not present

## 2021-11-08 DIAGNOSIS — C4441 Basal cell carcinoma of skin of scalp and neck: Secondary | ICD-10-CM | POA: Diagnosis not present

## 2022-01-02 DIAGNOSIS — D044 Carcinoma in situ of skin of scalp and neck: Secondary | ICD-10-CM | POA: Diagnosis not present

## 2022-01-02 DIAGNOSIS — D235 Other benign neoplasm of skin of trunk: Secondary | ICD-10-CM | POA: Diagnosis not present

## 2022-01-02 DIAGNOSIS — C4441 Basal cell carcinoma of skin of scalp and neck: Secondary | ICD-10-CM | POA: Diagnosis not present

## 2022-01-09 ENCOUNTER — Ambulatory Visit (INDEPENDENT_AMBULATORY_CARE_PROVIDER_SITE_OTHER): Payer: Medicare Other | Admitting: Family Medicine

## 2022-01-09 ENCOUNTER — Encounter: Payer: Self-pay | Admitting: Family Medicine

## 2022-01-09 VITALS — BP 142/72 | HR 71 | Temp 97.8°F | Wt 172.5 lb

## 2022-01-09 DIAGNOSIS — I129 Hypertensive chronic kidney disease with stage 1 through stage 4 chronic kidney disease, or unspecified chronic kidney disease: Secondary | ICD-10-CM | POA: Diagnosis not present

## 2022-01-09 DIAGNOSIS — Z23 Encounter for immunization: Secondary | ICD-10-CM | POA: Diagnosis not present

## 2022-01-09 DIAGNOSIS — E782 Mixed hyperlipidemia: Secondary | ICD-10-CM | POA: Diagnosis not present

## 2022-01-09 MED ORDER — HYDRALAZINE HCL 25 MG PO TABS: ORAL_TABLET | ORAL | 1 refills | Status: DC

## 2022-01-09 MED ORDER — INDAPAMIDE 2.5 MG PO TABS: 2.5000 mg | ORAL_TABLET | Freq: Every day | ORAL | 1 refills | Status: DC

## 2022-01-09 MED ORDER — SIMVASTATIN 20 MG PO TABS: 20.0000 mg | ORAL_TABLET | Freq: Every day | ORAL | 1 refills | Status: DC

## 2022-01-09 MED ORDER — LISINOPRIL 40 MG PO TABS: 40.0000 mg | ORAL_TABLET | Freq: Every day | ORAL | 1 refills | Status: DC

## 2022-01-09 NOTE — Progress Notes (Signed)
BP (!) 142/72   Pulse 71   Temp 97.8 F (36.6 C)   Wt 172 lb 8 oz (78.2 kg)   SpO2 99%   BMI 29.61 kg/m    Subjective:    Patient ID: Faith Cox, female    DOB: [birth year], 88 y.o.   MRN: [id number]  HPI: Faith Cox is a 88 y.o. female  Chief Complaint  Patient presents with   Hyperlipidemia   Hypertension   Aortic atherosclerosis    HYPERTENSION / HYPERLIPIDEMIA Satisfied with current treatment? yes Duration of hypertension: chronic BP monitoring frequency: a few times a week BP range: highest 140, usually 628-366 systolic at home BP medication side effects: no Past BP meds: hydralaxine, lisinopril, inadpamide Duration of hyperlipidemia: chronic Cholesterol medication side effects: no Cholesterol supplements: fish oil Past cholesterol medications: simvastatin Medication compliance: excellent compliance Aspirin: yes Recent stressors: no Recurrent headaches: no Visual changes: no Palpitations: no Dyspnea: no Chest pain: no Lower extremity edema: no Dizzy/lightheaded: no  Relevant past medical, surgical, family and social history reviewed and updated as indicated. Interim medical history since our last visit reviewed. Allergies and medications reviewed and updated.  Review of Systems  Constitutional: Negative.   Respiratory: Negative.    Cardiovascular: Negative.   Gastrointestinal: Negative.   Musculoskeletal: Negative.   Neurological: Negative.   Psychiatric/Behavioral: Negative.      Per HPI unless specifically indicated above     Objective:    BP (!) 142/72   Pulse 71   Temp 97.8 F (36.6 C)   Wt 172 lb 8 oz (78.2 kg)   SpO2 99%   BMI 29.61 kg/m   Wt Readings from Last 3 Encounters:  01/09/22 172 lb 8 oz (78.2 kg)  08/10/21 172 lb 9.6 oz (78.3 kg)  07/07/21 170 lb 12.8 oz (77.5 kg)    Physical Exam Vitals and nursing note reviewed.  Constitutional:      General: She is not in acute distress.    Appearance: Normal appearance. She  is not ill-appearing, toxic-appearing or diaphoretic.  HENT:     Head: Normocephalic and atraumatic.     Right Ear: External ear normal.     Left Ear: External ear normal.     Nose: Nose normal.     Mouth/Throat:     Mouth: Mucous membranes are moist.     Pharynx: Oropharynx is clear.  Eyes:     General: No scleral icterus.       Right eye: No discharge.        Left eye: No discharge.     Extraocular Movements: Extraocular movements intact.     Conjunctiva/sclera: Conjunctivae normal.     Pupils: Pupils are equal, round, and reactive to light.  Cardiovascular:     Rate and Rhythm: Normal rate and regular rhythm.     Pulses: Normal pulses.     Heart sounds: Normal heart sounds. No murmur heard.    No friction rub. No gallop.  Pulmonary:     Effort: Pulmonary effort is normal. No respiratory distress.     Breath sounds: Normal breath sounds. No stridor. No wheezing, rhonchi or rales.  Chest:     Chest wall: No tenderness.  Musculoskeletal:        General: Normal range of motion.     Cervical back: Normal range of motion and neck supple.  Skin:    General: Skin is warm and dry.     Capillary Refill: Capillary refill takes less than  2 seconds.     Coloration: Skin is not jaundiced or pale.     Findings: No bruising, erythema, lesion or rash.  Neurological:     General: No focal deficit present.     Mental Status: She is alert and oriented to person, place, and time. Mental status is at baseline.  Psychiatric:        Mood and Affect: Mood normal.        Behavior: Behavior normal.        Thought Content: Thought content normal.        Judgment: Judgment normal.     Results for orders placed or performed in visit on 07/07/21  Comprehensive metabolic panel  Result Value Ref Range   Glucose 95 70 - 99 mg/dL   BUN 21 8 - 27 mg/dL   Creatinine, Ser 1.20 (H) 0.57 - 1.00 mg/dL   eGFR 43 (L) >59 mL/min/1.73   BUN/Creatinine Ratio 18 12 - 28   Sodium 140 134 - 144 mmol/L    Potassium 4.6 3.5 - 5.2 mmol/L   Chloride 103 96 - 106 mmol/L   CO2 23 20 - 29 mmol/L   Calcium 9.7 8.7 - 10.3 mg/dL   Total Protein 6.3 6.0 - 8.5 g/dL   Albumin 4.3 3.6 - 4.6 g/dL   Globulin, Total 2.0 1.5 - 4.5 g/dL   Albumin/Globulin Ratio 2.2 1.2 - 2.2   Bilirubin Total 0.3 0.0 - 1.2 mg/dL   Alkaline Phosphatase 81 44 - 121 IU/L   AST 16 0 - 40 IU/L   ALT 12 0 - 32 IU/L  Lipid Panel w/o Chol/HDL Ratio  Result Value Ref Range   Cholesterol, Total 139 100 - 199 mg/dL   Triglycerides 154 (H) 0 - 149 mg/dL   HDL 47 >39 mg/dL   VLDL Cholesterol Cal 26 5 - 40 mg/dL   LDL Chol Calc (NIH) 66 0 - 99 mg/dL      Assessment & Plan:   Problem List Items Addressed This Visit       Genitourinary   Benign hypertensive renal disease    Under good control on current regimen. Continue current regimen. Continue to monitor. Call with any concerns. Refills given. Labs drawn today.       Relevant Orders   Comprehensive metabolic panel     Other   Hyperlipidemia    Under good control on current regimen. Continue current regimen. Continue to monitor. Call with any concerns. Refills given. Labs drawn today.      Relevant Medications   simvastatin (ZOCOR) 20 MG tablet   lisinopril (ZESTRIL) 40 MG tablet   indapamide (LOZOL) 2.5 MG tablet   hydrALAZINE (APRESOLINE) 25 MG tablet   Other Relevant Orders   Comprehensive metabolic panel   Lipid Panel w/o Chol/HDL Ratio   Other Visit Diagnoses     Need for influenza vaccination    -  Primary   Relevant Orders   Flu Vaccine QUAD High Dose(Fluad) (Completed)        Follow up plan: Return in about 6 months (around 07/11/2022) for physical.

## 2022-01-09 NOTE — Assessment & Plan Note (Signed)
Under good control on current regimen. Continue current regimen. Continue to monitor. Call with any concerns. Refills given. Labs drawn today.   

## 2022-01-10 LAB — COMPREHENSIVE METABOLIC PANEL
ALT: 12 IU/L (ref 0–32)
AST: 15 IU/L (ref 0–40)
Albumin/Globulin Ratio: 2.5 — ABNORMAL HIGH (ref 1.2–2.2)
Albumin: 4.5 g/dL (ref 3.6–4.6)
Alkaline Phosphatase: 72 IU/L (ref 44–121)
BUN/Creatinine Ratio: 18 (ref 12–28)
BUN: 21 mg/dL (ref 10–36)
Bilirubin Total: 0.4 mg/dL (ref 0.0–1.2)
CO2: 22 mmol/L (ref 20–29)
Calcium: 9.5 mg/dL (ref 8.7–10.3)
Chloride: 103 mmol/L (ref 96–106)
Creatinine, Ser: 1.15 mg/dL — ABNORMAL HIGH (ref 0.57–1.00)
Globulin, Total: 1.8 g/dL (ref 1.5–4.5)
Glucose: 98 mg/dL (ref 70–99)
Potassium: 4.5 mmol/L (ref 3.5–5.2)
Sodium: 139 mmol/L (ref 134–144)
Total Protein: 6.3 g/dL (ref 6.0–8.5)
eGFR: 45 mL/min/{1.73_m2} — ABNORMAL LOW (ref >59)

## 2022-01-10 LAB — LIPID PANEL W/O CHOL/HDL RATIO
Cholesterol, Total: 137 mg/dL (ref 100–199)
HDL: 49 mg/dL (ref >39)
LDL Chol Calc (NIH): 62 mg/dL (ref 0–99)
Triglycerides: 151 mg/dL — ABNORMAL HIGH (ref 0–149)
VLDL Cholesterol Cal: 26 mg/dL (ref 5–40)

## 2022-01-11 ENCOUNTER — Encounter: Payer: Self-pay | Admitting: Family Medicine

## 2022-01-30 ENCOUNTER — Encounter: Payer: Self-pay | Admitting: Nurse Practitioner

## 2022-01-30 ENCOUNTER — Ambulatory Visit (INDEPENDENT_AMBULATORY_CARE_PROVIDER_SITE_OTHER): Payer: Medicare Other | Admitting: Nurse Practitioner

## 2022-01-30 ENCOUNTER — Ambulatory Visit
Admission: RE | Admit: 2022-01-30 | Discharge: 2022-01-30 | Disposition: A | Discharge status: Home / Self Care | Payer: Medicare Other | Attending: Nurse Practitioner | Admitting: Nurse Practitioner

## 2022-01-30 ENCOUNTER — Ambulatory Visit
Admission: RE | Admit: 2022-01-30 | Discharge: 2022-01-30 | Disposition: A | Discharge status: Home / Self Care | Payer: Medicare Other | Source: Ambulatory Visit | Attending: Nurse Practitioner | Admitting: Nurse Practitioner

## 2022-01-30 ENCOUNTER — Ambulatory Visit: Payer: Self-pay

## 2022-01-30 VITALS — BP 138/78 | HR 83 | Temp 97.9°F | Wt 171.2 lb

## 2022-01-30 DIAGNOSIS — R9431 Abnormal electrocardiogram [ECG] [EKG]: Secondary | ICD-10-CM

## 2022-01-30 DIAGNOSIS — R0602 Shortness of breath: Secondary | ICD-10-CM | POA: Insufficient documentation

## 2022-01-30 LAB — VERITOR FLU A/B WAIVED
Influenza A: NEGATIVE
Influenza B: NEGATIVE

## 2022-01-30 MED ORDER — AMOXICILLIN-POT CLAVULANATE 875-125 MG PO TABS: 1.0000 | ORAL_TABLET | Freq: Two times a day (BID) | ORAL | 0 refills | Status: AC

## 2022-01-30 MED ORDER — ALBUTEROL SULFATE HFA 108 (90 BASE) MCG/ACT IN AERS: 2.0000 | INHALATION_SPRAY | Freq: Four times a day (QID) | RESPIRATORY_TRACT | 0 refills | Status: DC | PRN

## 2022-01-30 NOTE — Addendum Note (Signed)
Addended by: Marnee Guarneri T on: 01/30/2022 05:28 PM   Modules accepted: Orders

## 2022-01-30 NOTE — Assessment & Plan Note (Signed)
New onset over last week after acute illness.  Will obtain Covid and flu testing today.  EKG with slight abnormality in office, will place referral to cardiology for further assessment due to SOB and EKG.  Albuterol inhaler sent in.  Check CBC, CMP, TSH, and BNP today.  CXR ordered and she is aware of where to obtain.  Will determine next steps upon return of all testing above.  Return in one week, sooner if worsening.

## 2022-01-30 NOTE — Progress Notes (Signed)
Please let [name] know her flu is negative and chest x-ray report says no abnormality, but I am concerned with a little haziness down in left lower lung field (could be infection).  To be on safe side I am sending in some Augmentin to start taking BID for 7 days.  With her recent illness want to make sure lungs are happy.  Any questions? Keep being awesome!!  Thank you for allowing me to participate in your care.  I appreciate you. Kindest regards, Zi Newbury

## 2022-01-30 NOTE — Telephone Encounter (Signed)
  Chief Complaint: Cough - SOB Symptoms: ibid Frequency: Since Friday Pertinent Negatives: Patient denies Fever Disposition: '[]'$ ED /'[]'$ Urgent Care (no appt availability in office) / '[x]'$ Appointment(In office/virtual)/ '[]'$  [facility]/ '[]'$ Home Care/ '[]'$ Refused Recommended Disposition /'[]'$ [facility]/ '[]'$  Follow-up with PCP Additional Notes: Pt had a bit of a cough.  And treated at home with cough medicine. PT went to [facility] on 01/21/2022 where cough continued. Pt would not go to UC. Vacation was shortened to bring her home to See PCP. Cough continues and mild SOB.  Reason for Disposition  [1] MILD difficulty breathing (e.g., minimal/no SOB at rest, SOB with walking, pulse <100) AND [2] NEW-onset or WORSE than normal  Answer Assessment - Initial Assessment Questions 1. RESPIRATORY STATUS: "Describe your breathing?" (e.g., wheezing, shortness of breath, unable to speak, severe coughing)      SOB - breathing a bit hard 2. ONSET: "When did this breathing problem begin?"      Saturday 14th 3. PATTERN "Does the difficult breathing come and go, or has it been constant since it started?"      constant 4. SEVERITY: "How bad is your breathing?" (e.g., mild, moderate, severe)    - MILD: No SOB at rest, mild SOB with walking, speaks normally in sentences, can lie down, no retractions, pulse < 100.    - MODERATE: SOB at rest, SOB with minimal exertion and prefers to sit, cannot lie down flat, speaks in phrases, mild retractions, audible wheezing, pulse 100-120.    - SEVERE: Very SOB at rest, speaks in single words, struggling to breathe, sitting hunched forward, retractions, pulse > 120      mild 5. RECURRENT SYMPTOM: "Have you had difficulty breathing before?" If Yes, ask: "When was the last time?" and "What happened that time?"       6. CARDIAC HISTORY: "Do you have any history of heart disease?" (e.g., heart attack, angina, bypass surgery, angioplasty)      no 7. LUNG HISTORY: "Do you have  any history of lung disease?"  (e.g., pulmonary embolus, asthma, emphysema)     no 8. CAUSE: "What do you think is causing the breathing problem?"      unsure 9. OTHER SYMPTOMS: "Do you have any other symptoms? (e.g., dizziness, runny nose, cough, chest pain, fever)     Cough 10. O2 SATURATION MONITOR:  "Do you use an oxygen saturation monitor (pulse oximeter) at home?" If Yes, ask: "What is your reading (oxygen level) today?" "What is your usual oxygen saturation reading?" (e.g., 95%)        11. PREGNANCY: "Is there any chance you are pregnant?" "When was your last menstrual period?"        12. TRAVEL: "Have you traveled out of the country in the last month?" (e.g., travel history, exposures)  Protocols used: Breathing Difficulty-A-AH

## 2022-01-30 NOTE — Patient Instructions (Signed)
Shortness of Breath, Adult Shortness of breath means you have trouble breathing. Shortness of breath could be a sign of a medical problem. Follow these instructions at home:  Pollution Do not smoke or use any products that contain nicotine or tobacco. If you need help quitting, ask your doctor. Avoid things that can make it harder to breathe, such as: Smoke of all kinds. This includes smoke from campfires or forest fires. Do not smoke or allow others to smoke in your home. Mold. Dust. Air pollution. Chemical smells. Things that can give you an allergic reaction (allergens) if you have allergies. Keep your living space clean. Use products that help remove mold and dust. General instructions Watch for any changes in your symptoms. Take over-the-counter and prescription medicines only as told by your doctor. This includes oxygen therapy and inhaled medicines. Rest as needed. Return to your normal activities when your doctor says that it is safe. Keep all follow-up visits. Contact a doctor if: Your condition does not get better as soon as expected. You have a hard time doing your normal activities, even after you rest. You have new symptoms. You cannot walk up stairs. You cannot exercise the way you normally do. Get help right away if: Your shortness of breath gets worse. You have trouble breathing when you are resting. You feel light-headed or you faint. You have a cough that is not helped by medicines. You cough up blood. You have pain with breathing. You have pain in your chest, arms, shoulders, or belly (abdomen). You have a fever. These symptoms may be an emergency. Get help right away. Call 911. Do not wait to see if the symptoms will go away. Do not drive yourself to the hospital. Summary Shortness of breath is when you have trouble breathing enough air. It can be a sign of a medical problem. Avoid things that make it hard for you to breathe, such as smoking, pollution,  mold, and dust. Watch for any changes in your symptoms. Contact your doctor if you do not get better or you get worse. This information is not intended to replace advice given to you by your health care provider. Make sure you discuss any questions you have with your health care provider. Document Revised: 11/13/2020 Document Reviewed: 11/13/2020 Elsevier Patient Education  2023 [employer] Inc.  

## 2022-01-30 NOTE — Progress Notes (Signed)
BP 138/78 (BP Location: Left Arm, Patient Position: Sitting, Cuff Size: Normal)   Pulse 83   Temp 97.9 F (36.6 C) (Oral)   Wt 171 lb 3.2 oz (77.7 kg)   SpO2 97%   BMI 29.39 kg/m    Subjective:    Patient ID: Faith Cox, female    DOB: 1929/05/28, 88 y.o.   MRN: [id number]  HPI: Faith Cox is a 88 y.o. female  Chief Complaint  Patient presents with   Shortness of Breath    Pt states she has been having SOB since Thursday. Denies any other symptoms. States she experiences this constantly.    SHORTNESS OF BREATH Reports shortness of breath since Friday, was out of town and did not get back until Saturday from [place] (visiting son).  Thursday night last week had diarrhea al night and mild cough, mostly in morning when gets up.  Shortness of breath is constant and chest feels heavy when breathing.  No pain.  Does have HTN, takes medication for this.  Today was 130/67 at home.  Quit smoking 50 years ago. Time since onset: 3-4 days ago Onset: sudden Episode duration: constant Related to exertion: no Trauma: no Anxiety/recent stressors: no Aggravating factors: nothing Alleviating factors: nothing Status: fluctuating Treatments attempted: cough medication Cough: yes Nausea: yes Thursday night only Diaphoresis: no Heartburn: no Palpitations: no   Relevant past medical, surgical, family and social history reviewed and updated as indicated. Interim medical history since our last visit reviewed. Allergies and medications reviewed and updated.  Review of Systems  Constitutional:  Positive for appetite change. Negative for activity change, chills, diaphoresis, fever and unexpected weight change.  HENT:  Negative for congestion, postnasal drip, rhinorrhea, sinus pressure, sinus pain and sore throat.   Respiratory:  Positive for cough, chest tightness and shortness of breath. Negative for wheezing.   Cardiovascular:  Negative for chest pain, palpitations and leg swelling.   Neurological:  Positive for headaches.  Psychiatric/Behavioral: Negative.      Per HPI unless specifically indicated above     Objective:    BP 138/78 (BP Location: Left Arm, Patient Position: Sitting, Cuff Size: Normal)   Pulse 83   Temp 97.9 F (36.6 C) (Oral)   Wt 171 lb 3.2 oz (77.7 kg)   SpO2 97%   BMI 29.39 kg/m   Wt Readings from Last 3 Encounters:  01/30/22 171 lb 3.2 oz (77.7 kg)  01/09/22 172 lb 8 oz (78.2 kg)  08/10/21 172 lb 9.6 oz (78.3 kg)    Physical Exam Vitals and nursing note reviewed.  Constitutional:      General: She is awake. She is not in acute distress.    Appearance: She is well-developed and well-groomed. She is not ill-appearing or toxic-appearing.  HENT:     Head: Normocephalic.     Right Ear: Hearing, tympanic membrane, ear canal and external ear normal.     Left Ear: Hearing, tympanic membrane, ear canal and external ear normal.     Nose: Nose normal.     Right Sinus: No maxillary sinus tenderness or frontal sinus tenderness.     Left Sinus: No maxillary sinus tenderness or frontal sinus tenderness.     Mouth/Throat:     Mouth: Mucous membranes are moist.     Pharynx: No pharyngeal swelling, oropharyngeal exudate or posterior oropharyngeal erythema.  Eyes:     General: Lids are normal.        Right eye: No discharge.  Left eye: No discharge.     Conjunctiva/sclera: Conjunctivae normal.     Pupils: Pupils are equal, round, and reactive to light.  Neck:     Thyroid: No thyromegaly.     Vascular: No carotid bruit.  Cardiovascular:     Rate and Rhythm: Normal rate and regular rhythm.     Heart sounds: Normal heart sounds. No murmur heard.    No gallop.  Pulmonary:     Effort: Pulmonary effort is normal. No accessory muscle usage or respiratory distress.     Breath sounds: Normal breath sounds.     Comments: Overall lungs clear throughout.  Mild SOB noted with movement from chair to undressing to table. Abdominal:     General:  Bowel sounds are normal.     Palpations: Abdomen is soft. There is no hepatomegaly or splenomegaly.  Musculoskeletal:     Cervical back: Normal range of motion and neck supple.     Right lower leg: No edema.     Left lower leg: No edema.  Lymphadenopathy:     Cervical: No cervical adenopathy.  Skin:    General: Skin is warm and dry.  Neurological:     Mental Status: She is alert and oriented to person, place, and time.  Psychiatric:        Attention and Perception: Attention normal.        Mood and Affect: Mood normal.        Speech: Speech normal.        Behavior: Behavior normal. Behavior is cooperative.        Thought Content: Thought content normal.    EKG My review and personal interpretation at Time: 1545   Indication: SOB Rate: 74 Rhythm: sinus with x 2 pause present Axis: normal Other: no nonspecific st abn, no stemi, no lvh, present RBBB  Results for orders placed or performed in visit on 01/09/22  Comprehensive metabolic panel  Result Value Ref Range   Glucose 98 70 - 99 mg/dL   BUN 21 10 - 36 mg/dL   Creatinine, Ser 1.15 (H) 0.57 - 1.00 mg/dL   eGFR 45 (L) >59 mL/min/1.73   BUN/Creatinine Ratio 18 12 - 28   Sodium 139 134 - 144 mmol/L   Potassium 4.5 3.5 - 5.2 mmol/L   Chloride 103 96 - 106 mmol/L   CO2 22 20 - 29 mmol/L   Calcium 9.5 8.7 - 10.3 mg/dL   Total Protein 6.3 6.0 - 8.5 g/dL   Albumin 4.5 3.6 - 4.6 g/dL   Globulin, Total 1.8 1.5 - 4.5 g/dL   Albumin/Globulin Ratio 2.5 (H) 1.2 - 2.2   Bilirubin Total 0.4 0.0 - 1.2 mg/dL   Alkaline Phosphatase 72 44 - 121 IU/L   AST 15 0 - 40 IU/L   ALT 12 0 - 32 IU/L  Lipid Panel w/o Chol/HDL Ratio  Result Value Ref Range   Cholesterol, Total 137 100 - 199 mg/dL   Triglycerides 151 (H) 0 - 149 mg/dL   HDL 49 >39 mg/dL   VLDL Cholesterol Cal 26 5 - 40 mg/dL   LDL Chol Calc (NIH) 62 0 - 99 mg/dL      Assessment & Plan:   Problem List Items Addressed This Visit       Other   Shortness of breath - Primary     New onset over last week after acute illness.  Will obtain Covid and flu testing today.  EKG with slight abnormality in office, will  place referral to cardiology for further assessment due to SOB and EKG.  Albuterol inhaler sent in.  Check CBC, CMP, TSH, and BNP today.  CXR ordered and she is aware of where to obtain.  Will determine next steps upon return of all testing above.  Return in one week, sooner if worsening.      Relevant Orders   Novel Coronavirus, NAA ([facility])   Veritor Flu A/B Waived   DG Chest 2 View   CBC with Differential/Platelet   Comprehensive metabolic panel   TSH   B Nat Peptide   EKG 12-Lead (Completed)   Ambulatory referral to Cardiology   Other Visit Diagnoses     Abnormal EKG       Referral to cardiology.   Relevant Orders   Ambulatory referral to Cardiology        Follow up plan: Return in about 1 week (around 02/06/2022) for SOB.

## 2022-01-31 ENCOUNTER — Encounter: Payer: Self-pay | Admitting: Nurse Practitioner

## 2022-01-31 DIAGNOSIS — D649 Anemia, unspecified: Secondary | ICD-10-CM | POA: Insufficient documentation

## 2022-01-31 LAB — NOVEL CORONAVIRUS, NAA: SARS-CoV-2, NAA: NOT DETECTED

## 2022-01-31 NOTE — Progress Notes (Signed)
Good morning, please let Faith Cox know her labs have returned: - CBC shows hemoglobin and hematocrit a little low which is new for her, a little anemia, which can make you feel bad.  I do recommend we recheck this at next visit, I see you are scheduled with Dr. [name] -- we can recheck then.  If shortness of breath worsens please see [place] sooner then scheduled. - Kidney function continues to show chronic kidney disease present, at baseline with no worsening.  Good news!! - Glucose was a little elevated (sugar), monitor diet.  Sodium (salt) a little low, please add just a tad (not much) of table salt to diet daily and we will recheck next visit. - Thyroid testing normal and Covid was negative.  Waiting on one more lab to return and will alert you when does:) Any questions?

## 2022-02-01 LAB — CBC WITH DIFFERENTIAL/PLATELET
Basophils Absolute: 0 10*3/uL (ref 0.0–0.2)
Basos: 0 %
EOS (ABSOLUTE): 1.1 10*3/uL — ABNORMAL HIGH (ref 0.0–0.4)
Eos: 12 %
Hematocrit: 30.6 % — ABNORMAL LOW (ref 34.0–46.6)
Hemoglobin: 10.6 g/dL — ABNORMAL LOW (ref 11.1–15.9)
Immature Grans (Abs): 0.1 10*3/uL (ref 0.0–0.1)
Immature Granulocytes: 1 %
Lymphocytes Absolute: 1.6 10*3/uL (ref 0.7–3.1)
Lymphs: 18 %
MCH: 30.9 pg (ref 26.6–33.0)
MCHC: 34.6 g/dL (ref 31.5–35.7)
MCV: 89 fL (ref 79–97)
Monocytes Absolute: 0.8 10*3/uL (ref 0.1–0.9)
Monocytes: 9 %
Neutrophils Absolute: 5.3 10*3/uL (ref 1.4–7.0)
Neutrophils: 60 %
Platelets: 265 10*3/uL (ref 150–450)
RBC: 3.43 x10E6/uL — ABNORMAL LOW (ref 3.77–5.28)
RDW: 12 % (ref 11.7–15.4)
WBC: 8.8 10*3/uL (ref 3.4–10.8)

## 2022-02-01 LAB — COMPREHENSIVE METABOLIC PANEL
ALT: 9 IU/L (ref 0–32)
AST: 12 IU/L (ref 0–40)
Albumin/Globulin Ratio: 2.2 (ref 1.2–2.2)
Albumin: 4.1 g/dL (ref 3.6–4.6)
Alkaline Phosphatase: 94 IU/L (ref 44–121)
BUN/Creatinine Ratio: 13 (ref 12–28)
BUN: 16 mg/dL (ref 10–36)
Bilirubin Total: 0.3 mg/dL (ref 0.0–1.2)
CO2: 19 mmol/L — ABNORMAL LOW (ref 20–29)
Calcium: 9 mg/dL (ref 8.7–10.3)
Chloride: 96 mmol/L (ref 96–106)
Creatinine, Ser: 1.22 mg/dL — ABNORMAL HIGH (ref 0.57–1.00)
Globulin, Total: 1.9 g/dL (ref 1.5–4.5)
Glucose: 110 mg/dL — ABNORMAL HIGH (ref 70–99)
Potassium: 3.5 mmol/L (ref 3.5–5.2)
Sodium: 132 mmol/L — ABNORMAL LOW (ref 134–144)
Total Protein: 6 g/dL (ref 6.0–8.5)
eGFR: 42 mL/min/{1.73_m2} — ABNORMAL LOW (ref >59)

## 2022-02-01 LAB — TSH: TSH: 0.94 u[IU]/mL (ref 0.450–4.500)

## 2022-02-01 LAB — BRAIN NATRIURETIC PEPTIDE: BNP: 54 pg/mL (ref 0.0–100.0)

## 2022-02-09 ENCOUNTER — Ambulatory Visit: Payer: Medicare Other | Admitting: Family Medicine

## 2022-02-09 ENCOUNTER — Encounter: Payer: Self-pay | Admitting: Family Medicine

## 2022-02-09 VITALS — BP 121/64 | HR 71 | Temp 97.8°F | Wt 167.8 lb

## 2022-02-09 DIAGNOSIS — D649 Anemia, unspecified: Secondary | ICD-10-CM

## 2022-02-09 NOTE — Progress Notes (Signed)
BP 121/64   Pulse 71   Temp 97.8 F (36.6 C)   Wt 167 lb 12.8 oz (76.1 kg)   SpO2 97%   BMI 28.80 kg/m    Subjective:    Patient ID: Faith Cox, female    DOB: 05-29-1929, 88 y.o.   MRN: [id number]  HPI: Faith Cox is a 88 y.o. female  Chief Complaint  Patient presents with   Shortness of Breath    Patient states she feels better in regards to her SOB but still feels real tired. Has appointment December 14 with cardiology.    FATIGUE Duration: 2 weeks Severity: moderate  Onset: sudden Context when symptoms started:   GI bug Symptoms improve with rest: no  Depressive symptoms: no Stress/anxiety: no Insomnia: no  Snoring: no Observed apnea by bed partner: no Daytime hypersomnolence:no Wakes feeling refreshed: yes History of sleep study: no Dysnea on exertion:  no Orthopnea/PND: no Chest pain: no Chronic cough: no Lower extremity edema: no Arthralgias:no Myalgias: no Weakness: no Rash: no  Relevant past medical, surgical, family and social history reviewed and updated as indicated. Interim medical history since our last visit reviewed. Allergies and medications reviewed and updated.  Review of Systems  Constitutional:  Positive for fatigue. Negative for activity change, appetite change, chills, diaphoresis, fever and unexpected weight change.  HENT: Negative.    Respiratory: Negative.    Cardiovascular: Negative.   Gastrointestinal: Negative.   Musculoskeletal: Negative.   Neurological: Negative.   Psychiatric/Behavioral: Negative.      Per HPI unless specifically indicated above     Objective:    BP 121/64   Pulse 71   Temp 97.8 F (36.6 C)   Wt 167 lb 12.8 oz (76.1 kg)   SpO2 97%   BMI 28.80 kg/m   Wt Readings from Last 3 Encounters:  02/09/22 167 lb 12.8 oz (76.1 kg)  01/30/22 171 lb 3.2 oz (77.7 kg)  01/09/22 172 lb 8 oz (78.2 kg)    Physical Exam Vitals and nursing note reviewed.  Constitutional:      General: She is not in  acute distress.    Appearance: Normal appearance. She is well-developed and normal weight. She is not ill-appearing, toxic-appearing or diaphoretic.  HENT:     Head: Normocephalic and atraumatic.     Right Ear: External ear normal.     Left Ear: External ear normal.     Nose: Nose normal.     Mouth/Throat:     Mouth: Mucous membranes are moist.     Pharynx: Oropharynx is clear.  Eyes:     General: No scleral icterus.       Right eye: No discharge.        Left eye: No discharge.     Extraocular Movements: Extraocular movements intact.     Conjunctiva/sclera: Conjunctivae normal.     Pupils: Pupils are equal, round, and reactive to light.  Cardiovascular:     Rate and Rhythm: Normal rate and regular rhythm.     Pulses: Normal pulses.     Heart sounds: Normal heart sounds. No murmur heard.    No friction rub. No gallop.  Pulmonary:     Effort: Pulmonary effort is normal. No respiratory distress.     Breath sounds: Normal breath sounds. No stridor. No wheezing, rhonchi or rales.  Chest:     Chest wall: No tenderness.  Musculoskeletal:        General: Normal range of motion.  Cervical back: Normal range of motion and neck supple.  Skin:    General: Skin is warm and dry.     Capillary Refill: Capillary refill takes less than 2 seconds.     Coloration: Skin is not jaundiced or pale.     Findings: No bruising, erythema, lesion or rash.  Neurological:     General: No focal deficit present.     Mental Status: She is alert and oriented to person, place, and time. Mental status is at baseline.  Psychiatric:        Mood and Affect: Mood normal.        Behavior: Behavior normal.        Thought Content: Thought content normal.        Judgment: Judgment normal.     Results for orders placed or performed in visit on 01/30/22  Novel Coronavirus, NAA ([facility])   Specimen: Nasopharyngeal(NP) swabs in vial transport medium  Result Value Ref Range   SARS-CoV-2, NAA Not Detected Not  Detected  Veritor Flu A/B Waived  Result Value Ref Range   Influenza A Negative Negative   Influenza B Negative Negative  CBC with Differential/Platelet  Result Value Ref Range   WBC 8.8 3.4 - 10.8 x10E3/uL   RBC 3.43 (L) 3.77 - 5.28 x10E6/uL   Hemoglobin 10.6 (L) 11.1 - 15.9 g/dL   Hematocrit 30.6 (L) 34.0 - 46.6 %   MCV 89 79 - 97 fL   MCH 30.9 26.6 - 33.0 pg   MCHC 34.6 31.5 - 35.7 g/dL   RDW 12.0 11.7 - 15.4 %   Platelets 265 150 - 450 x10E3/uL   Neutrophils 60 Not Estab. %   Lymphs 18 Not Estab. %   Monocytes 9 Not Estab. %   Eos 12 Not Estab. %   Basos 0 Not Estab. %   Neutrophils Absolute 5.3 1.4 - 7.0 x10E3/uL   Lymphocytes Absolute 1.6 0.7 - 3.1 x10E3/uL   Monocytes Absolute 0.8 0.1 - 0.9 x10E3/uL   EOS (ABSOLUTE) 1.1 (H) 0.0 - 0.4 x10E3/uL   Basophils Absolute 0.0 0.0 - 0.2 x10E3/uL   Immature Granulocytes 1 Not Estab. %   Immature Grans (Abs) 0.1 0.0 - 0.1 x10E3/uL  Comprehensive metabolic panel  Result Value Ref Range   Glucose 110 (H) 70 - 99 mg/dL   BUN 16 10 - 36 mg/dL   Creatinine, Ser 1.22 (H) 0.57 - 1.00 mg/dL   eGFR 42 (L) >59 mL/min/1.73   BUN/Creatinine Ratio 13 12 - 28   Sodium 132 (L) 134 - 144 mmol/L   Potassium 3.5 3.5 - 5.2 mmol/L   Chloride 96 96 - 106 mmol/L   CO2 19 (L) 20 - 29 mmol/L   Calcium 9.0 8.7 - 10.3 mg/dL   Total Protein 6.0 6.0 - 8.5 g/dL   Albumin 4.1 3.6 - 4.6 g/dL   Globulin, Total 1.9 1.5 - 4.5 g/dL   Albumin/Globulin Ratio 2.2 1.2 - 2.2   Bilirubin Total 0.3 0.0 - 1.2 mg/dL   Alkaline Phosphatase 94 44 - 121 IU/L   AST 12 0 - 40 IU/L   ALT 9 0 - 32 IU/L  TSH  Result Value Ref Range   TSH 0.940 0.450 - 4.500 uIU/mL  B Nat Peptide  Result Value Ref Range   BNP 54.0 0.0 - 100.0 pg/mL      Assessment & Plan:   Problem List Items Addressed This Visit   None Visit Diagnoses     Anemia, unspecified  type    -  Primary   Will check for cause. ?causing fatigue. Encouraged rest. Rechcek 2 weeks. Call with any concerns.    Relevant Orders   CBC with Differential/Platelet   Iron Binding Cap (TIBC)([facility])   B12   Ferritin   Fecal occult blood, imunochemical([facility])        Follow up plan: Return in about 2 weeks (around 02/23/2022).

## 2022-02-10 DIAGNOSIS — D649 Anemia, unspecified: Secondary | ICD-10-CM | POA: Diagnosis not present

## 2022-02-10 LAB — CBC WITH DIFFERENTIAL/PLATELET
Basophils Absolute: 0.1 10*3/uL (ref 0.0–0.2)
Basos: 1 %
EOS (ABSOLUTE): 0.8 10*3/uL — ABNORMAL HIGH (ref 0.0–0.4)
Eos: 7 %
Hematocrit: 31.6 % — ABNORMAL LOW (ref 34.0–46.6)
Hemoglobin: 10.7 g/dL — ABNORMAL LOW (ref 11.1–15.9)
Immature Grans (Abs): 0.1 10*3/uL (ref 0.0–0.1)
Immature Granulocytes: 1 %
Lymphocytes Absolute: 1.6 10*3/uL (ref 0.7–3.1)
Lymphs: 14 %
MCH: 30.7 pg (ref 26.6–33.0)
MCHC: 33.9 g/dL (ref 31.5–35.7)
MCV: 91 fL (ref 79–97)
Monocytes Absolute: 0.9 10*3/uL (ref 0.1–0.9)
Monocytes: 8 %
Neutrophils Absolute: 7.7 10*3/uL — ABNORMAL HIGH (ref 1.4–7.0)
Neutrophils: 69 %
Platelets: 322 10*3/uL (ref 150–450)
RBC: 3.49 x10E6/uL — ABNORMAL LOW (ref 3.77–5.28)
RDW: 12.2 % (ref 11.7–15.4)
WBC: 11.2 10*3/uL — ABNORMAL HIGH (ref 3.4–10.8)

## 2022-02-10 LAB — IRON AND TIBC
Iron Saturation: 39 % (ref 15–55)
Iron: 102 ug/dL (ref 27–139)
Total Iron Binding Capacity: 259 ug/dL (ref 250–450)
UIBC: 157 ug/dL (ref 118–369)

## 2022-02-10 LAB — FERRITIN: Ferritin: 749 ng/mL — ABNORMAL HIGH (ref 15–150)

## 2022-02-10 LAB — VITAMIN B12: Vitamin B-12: 1956 pg/mL — ABNORMAL HIGH (ref 232–1245)

## 2022-02-13 ENCOUNTER — Other Ambulatory Visit: Payer: Self-pay | Admitting: Family Medicine

## 2022-02-13 LAB — FECAL OCCULT BLOOD, IMMUNOCHEMICAL: Fecal Occult Bld: NEGATIVE

## 2022-02-13 MED ORDER — IRON 325 (65 FE) MG PO TABS: 325.0000 mg | ORAL_TABLET | Freq: Two times a day (BID) | ORAL | 1 refills | Status: DC

## 2022-02-14 ENCOUNTER — Telehealth: Payer: Self-pay

## 2022-02-14 NOTE — Telephone Encounter (Signed)
Pt given lab results per notes of Dr. [name] on 02/14/22. Pt verbalized understanding. Needs lab order entered and appt made.

## 2022-02-23 ENCOUNTER — Ambulatory Visit (INDEPENDENT_AMBULATORY_CARE_PROVIDER_SITE_OTHER): Payer: Medicare Other | Admitting: Family Medicine

## 2022-02-23 ENCOUNTER — Encounter: Payer: Self-pay | Admitting: Family Medicine

## 2022-02-23 VITALS — BP 144/74 | HR 63 | Temp 97.5°F | Wt 166.7 lb

## 2022-02-23 DIAGNOSIS — D649 Anemia, unspecified: Secondary | ICD-10-CM | POA: Diagnosis not present

## 2022-02-23 NOTE — Progress Notes (Signed)
BP (!) 144/74   Pulse 63   Temp (!) 97.5 F (36.4 C) (Oral)   Wt 166 lb 11.2 oz (75.6 kg)   SpO2 97%   BMI 28.61 kg/m    Subjective:    Patient ID: [name], female    DOB: 1929-08-20, 88 y.o.   MRN: [id number]  HPI: Faith Cox is a 88 y.o. female  Chief Complaint  Patient presents with   Anemia   ANEMIA Anemia status: better Etiology of anemia: iron def Duration of anemia treatment: about 2 weeks Compliance with treatment: excellent compliance Iron supplementation side effects: no Severity of anemia: mild Fatigue: yes Decreased exercise tolerance: no  Dyspnea on exertion: no Palpitations: no Bleeding: no Pica: no  Relevant past medical, surgical, family and social history reviewed and updated as indicated. Interim medical history since our last visit reviewed. Allergies and medications reviewed and updated.  Review of Systems  Constitutional:  Positive for fatigue. Negative for activity change, appetite change, chills, diaphoresis, fever and unexpected weight change.  Respiratory: Negative.    Cardiovascular: Negative.   Musculoskeletal: Negative.   Psychiatric/Behavioral: Negative.      Per HPI unless specifically indicated above     Objective:    BP (!) 144/74   Pulse 63   Temp (!) 97.5 F (36.4 C) (Oral)   Wt 166 lb 11.2 oz (75.6 kg)   SpO2 97%   BMI 28.61 kg/m   Wt Readings from Last 3 Encounters:  02/23/22 166 lb 11.2 oz (75.6 kg)  02/09/22 167 lb 12.8 oz (76.1 kg)  01/30/22 171 lb 3.2 oz (77.7 kg)    Physical Exam Vitals and nursing note reviewed.  Constitutional:      General: She is not in acute distress.    Appearance: Normal appearance. She is not ill-appearing, toxic-appearing or diaphoretic.  HENT:     Head: Normocephalic and atraumatic.     Right Ear: External ear normal.     Left Ear: External ear normal.     Nose: Nose normal.     Mouth/Throat:     Mouth: Mucous membranes are moist.     Pharynx: Oropharynx is clear.   Eyes:     General: No scleral icterus.       Right eye: No discharge.        Left eye: No discharge.     Extraocular Movements: Extraocular movements intact.     Conjunctiva/sclera: Conjunctivae normal.     Pupils: Pupils are equal, round, and reactive to light.  Cardiovascular:     Rate and Rhythm: Normal rate and regular rhythm.     Pulses: Normal pulses.     Heart sounds: Normal heart sounds. No murmur heard.    No friction rub. No gallop.  Pulmonary:     Effort: Pulmonary effort is normal. No respiratory distress.     Breath sounds: Normal breath sounds. No stridor. No wheezing, rhonchi or rales.  Chest:     Chest wall: No tenderness.  Musculoskeletal:        General: Normal range of motion.     Cervical back: Normal range of motion and neck supple.  Skin:    General: Skin is warm and dry.     Capillary Refill: Capillary refill takes less than 2 seconds.     Coloration: Skin is not jaundiced or pale.     Findings: No bruising, erythema, lesion or rash.  Neurological:     General: No focal deficit present.  Mental Status: She is alert and oriented to person, place, and time. Mental status is at baseline.  Psychiatric:        Mood and Affect: Mood normal.        Behavior: Behavior normal.        Thought Content: Thought content normal.        Judgment: Judgment normal.     Results for orders placed or performed in visit on 02/09/22  Fecal occult blood, imunochemical([facility])   Specimen: Stool   ST  Result Value Ref Range   Fecal Occult Bld Negative Negative  CBC with Differential/Platelet  Result Value Ref Range   WBC 11.2 (H) 3.4 - 10.8 x10E3/uL   RBC 3.49 (L) 3.77 - 5.28 x10E6/uL   Hemoglobin 10.7 (L) 11.1 - 15.9 g/dL   Hematocrit 31.6 (L) 34.0 - 46.6 %   MCV 91 79 - 97 fL   MCH 30.7 26.6 - 33.0 pg   MCHC 33.9 31.5 - 35.7 g/dL   RDW 12.2 11.7 - 15.4 %   Platelets 322 150 - 450 x10E3/uL   Neutrophils 69 Not Estab. %   Lymphs 14 Not Estab. %    Monocytes 8 Not Estab. %   Eos 7 Not Estab. %   Basos 1 Not Estab. %   Neutrophils Absolute 7.7 (H) 1.4 - 7.0 x10E3/uL   Lymphocytes Absolute 1.6 0.7 - 3.1 x10E3/uL   Monocytes Absolute 0.9 0.1 - 0.9 x10E3/uL   EOS (ABSOLUTE) 0.8 (H) 0.0 - 0.4 x10E3/uL   Basophils Absolute 0.1 0.0 - 0.2 x10E3/uL   Immature Granulocytes 1 Not Estab. %   Immature Grans (Abs) 0.1 0.0 - 0.1 x10E3/uL  Iron Binding Cap (TIBC)([facility])  Result Value Ref Range   Total Iron Binding Capacity 259 250 - 450 ug/dL   UIBC 157 118 - 369 ug/dL   Iron 102 27 - 139 ug/dL   Iron Saturation 39 15 - 55 %  B12  Result Value Ref Range   Vitamin B-12 1,956 (H) 232 - 1,245 pg/mL  Ferritin  Result Value Ref Range   Ferritin 749 (H) 15 - 150 ng/mL      Assessment & Plan:   Problem List Items Addressed This Visit   None Visit Diagnoses     Anemia, unspecified type    -  Primary   Feeling significantly better. Will recheck labs today. Await results. Treat as needed.   Relevant Orders   CBC with Differential/Platelet        Follow up plan: Return in about 4 months (around 06/24/2022) for physical.

## 2022-02-24 LAB — CBC WITH DIFFERENTIAL/PLATELET
Basophils Absolute: 0.1 10*3/uL (ref 0.0–0.2)
Basos: 1 %
EOS (ABSOLUTE): 1 10*3/uL — ABNORMAL HIGH (ref 0.0–0.4)
Eos: 8 %
Hematocrit: 29.2 % — ABNORMAL LOW (ref 34.0–46.6)
Hemoglobin: 10 g/dL — ABNORMAL LOW (ref 11.1–15.9)
Immature Grans (Abs): 0 10*3/uL (ref 0.0–0.1)
Immature Granulocytes: 0 %
Lymphocytes Absolute: 1.9 10*3/uL (ref 0.7–3.1)
Lymphs: 16 %
MCH: 31.2 pg (ref 26.6–33.0)
MCHC: 34.2 g/dL (ref 31.5–35.7)
MCV: 91 fL (ref 79–97)
Monocytes Absolute: 1 10*3/uL — ABNORMAL HIGH (ref 0.1–0.9)
Monocytes: 8 %
Neutrophils Absolute: 7.8 10*3/uL — ABNORMAL HIGH (ref 1.4–7.0)
Neutrophils: 67 %
Platelets: 259 10*3/uL (ref 150–450)
RBC: 3.21 x10E6/uL — ABNORMAL LOW (ref 3.77–5.28)
RDW: 12.2 % (ref 11.7–15.4)
WBC: 11.8 10*3/uL — ABNORMAL HIGH (ref 3.4–10.8)

## 2022-03-17 ENCOUNTER — Other Ambulatory Visit: Payer: Medicare Other

## 2022-03-23 ENCOUNTER — Ambulatory Visit: Payer: Medicare Other | Attending: Cardiology | Admitting: Cardiology

## 2022-03-23 ENCOUNTER — Encounter: Payer: Self-pay | Admitting: Cardiology

## 2022-03-23 VITALS — BP 162/70 | HR 70 | Ht 66.0 in | Wt 165.0 lb

## 2022-03-23 DIAGNOSIS — R0602 Shortness of breath: Secondary | ICD-10-CM

## 2022-03-23 DIAGNOSIS — I10 Essential (primary) hypertension: Secondary | ICD-10-CM | POA: Diagnosis not present

## 2022-03-23 DIAGNOSIS — E7800 Pure hypercholesterolemia, unspecified: Secondary | ICD-10-CM

## 2022-03-23 NOTE — Progress Notes (Signed)
Cardiology Office Note:    Date:  03/23/2022   ID:  Faith Cox, DOB [birth year], MRN [id number]  PCP:  Valerie Roys, DO   [facility] Cardiologist:  Kate Sable, MD     Referring MD: Venita Lick, NP   Chief Complaint  Patient presents with   NEW patient-Evaluate abnormal EKG and SOB    Denies chest pain    History of Present Illness:    Faith Cox is a 88 y.o. female with a hx of hypertension, hyperlipidemia presenting with shortness of breath.  Patient was in [place] visiting family 2 months ago when she developed a stomach virus.  She had nausea, and diarrhea.  States feeling fatigued and out of breath after her acute illness.  Followed up with PCP, diagnosed with anemia.  Started on iron.  Overall she has improved since then.  Denies chest pain or shortness of breath.  Denies any history of heart disease or heart attacks.  Denies edema, palpitations.  Blood pressure is well-controlled at home with systolics in the 458K.  Usually gets elevated when she comes to the doctor's office.  Past Medical History:  Diagnosis Date   Cancer ([place])    skin   Hyperlipidemia    Hypertension     Past Surgical History:  Procedure Laterality Date   ABDOMINAL HYSTERECTOMY     APPENDECTOMY     BASAL CELL CARCINOMA EXCISION Right 12/24/2019   Right chin   BASAL CELL CARCINOMA EXCISION Right 01/22/2020, 01/29/20   Right post auricular   BASAL CELL CARCINOMA EXCISION  06/30/2020   bladder repair     BREAST EXCISIONAL BIOPSY Left 08/09/1978   neg   GALLBLADDER SURGERY     TOE SURGERY      Current Medications: No outpatient medications have been marked as taking for the 03/23/22 encounter (Office Visit) with Kate Sable, MD.     Allergies:   Amlodipine   Social History   Socioeconomic History   Marital status: Married    Spouse name: Not on file   Number of children: Not on file   Years of education: Not on file   Highest education  level: Not on file  Occupational History   Occupation: retired  Tobacco Use   Smoking status: Former    Types: Cigarettes    Quit date: 04/11/1979    Years since quitting: 42.9   Smokeless tobacco: Never   Tobacco comments:    quit 30 years ago   Vaping Use   Vaping Use: Never used  Substance and Sexual Activity   Alcohol use: Yes    Alcohol/week: 0.0 standard drinks of alcohol    Comment: beer on occasion   Drug use: No   Sexual activity: Not Currently  Other Topics Concern   Not on file  Social History Narrative   Not on file   Social Determinants of Health   Financial Resource Strain: Low Risk  (06/30/2021)   Overall Financial Resource Strain (CARDIA)    Difficulty of Paying Living Expenses: Not hard at all  Food Insecurity: No Food Insecurity (06/30/2021)   Hunger Vital Sign    Worried About Running Out of Food in the Last Year: Never true    Ran Out of Food in the Last Year: Never true  Transportation Needs: No Transportation Needs (06/30/2021)   PRAPARE - Hydrologist (Medical): No    Lack of Transportation (Non-Medical): No  Physical Activity: Sufficiently  Active (06/30/2021)   Exercise Vital Sign    Days of Exercise per Week: 5 days    Minutes of Exercise per Session: 50 min  Stress: No Stress Concern Present (06/30/2021)   [place]    Feeling of Stress : Not at all  Social Connections: Socially Isolated (06/30/2021)   Social Connection and Isolation Panel [NHANES]    Frequency of Communication with Friends and Family: More than three times a week    Frequency of Social Gatherings with Friends and Family: Once a week    Attends Religious Services: Never    Marine scientist or Organizations: No    Attends Archivist Meetings: Never    Marital Status: Widowed     Family History: The patient's family history includes Alzheimer's disease in her paternal  grandmother; Leukemia in her brother; Pancreatic cancer in her mother; Stroke in her father. There is no history of Breast cancer.  ROS:   Please see the history of present illness.     All other systems reviewed and are negative.  EKGs/Labs/Other Studies Reviewed:    The following studies were reviewed today:   EKG:  EKG is  ordered today.  The ekg ordered today demonstrates normal sinus rhythm, normal ECG  Recent Labs: 01/30/2022: ALT 9; BNP 54.0; BUN 16; Creatinine, Ser 1.22; Potassium 3.5; Sodium 132; TSH 0.940 02/23/2022: Hemoglobin 10.0; Platelets 259  Recent Lipid Panel    Component Value Date/Time   CHOL 137 01/09/2022 0852   CHOL 166 12/08/2015 0902   TRIG 151 (H) 01/09/2022 0852   TRIG 149 12/08/2015 0902   HDL 49 01/09/2022 0852   VLDL 30 (H) 12/08/2015 0902   LDLCALC 62 01/09/2022 0852     Risk Assessment/Calculations:     HYPERTENSION CONTROL Vitals:   03/23/22 1100 03/23/22 1103  BP: (!) 168/62 (!) 162/70    The patient's blood pressure is elevated above target today.  In order to address the patient's elevated BP: The blood pressure is usually elevated in clinic.  Blood pressures monitored at home have been optimal.            Physical Exam:    VS:  BP (!) 162/70 (BP Location: Right Arm, Patient Position: Sitting, Cuff Size: Normal)   Pulse 70   Ht '5\' 6"'$  (1.676 m)   Wt 165 lb (74.8 kg)   SpO2 96%   BMI 26.63 kg/m     Wt Readings from Last 3 Encounters:  03/23/22 165 lb (74.8 kg)  02/23/22 166 lb 11.2 oz (75.6 kg)  02/09/22 167 lb 12.8 oz (76.1 kg)     GEN:  Well nourished, well developed in no acute distress HEENT: Normal NECK: No JVD; No carotid bruits CARDIAC: RRR, no murmurs, rubs, gallops RESPIRATORY:  Clear to auscultation without rales, wheezing or rhonchi  ABDOMEN: Soft, non-tender, non-distended MUSCULOSKELETAL:  No edema; No deformity  SKIN: Warm and dry NEUROLOGIC:  Alert and oriented x 3 PSYCHIATRIC:  Normal affect    ASSESSMENT:    1. Shortness of breath   2. White coat syndrome with hypertension   3. Pure hypercholesterolemia    PLAN:    In order of problems listed above:  Shortness of breath, likely from acute illness versus anemia.  Overall resolved with taking iron.  Obtain echo to rule out any gross structural abnormalities.  If echocardiogram is normal, we will not pursue additional cardiac testing. Hypertension, BP elevated, controlled at  home.  Patient has whitecoat syndrome.  Continue current meds as prescribed. Hyperlipidemia, continue statin.  Follow-up after echo.      Medication Adjustments/Labs and Tests Ordered: Current medicines are reviewed at length with the patient today.  Concerns regarding medicines are outlined above.  Orders Placed This Encounter  Procedures   EKG 12-Lead   ECHOCARDIOGRAM COMPLETE   No orders of the defined types were placed in this encounter.   Patient Instructions  Medication Instructions:   Your physician recommends that you continue on your current medications as directed. Please refer to the Current Medication list given to you today.  *If you need a refill on your cardiac medications before your next appointment, please call your pharmacy*   Lab Work:  None Ordered  If you have labs (blood work) drawn today and your tests are completely normal, you will receive your results only by: [place] (if you have MyChart) OR A paper copy in the mail If you have any lab test that is abnormal or we need to change your treatment, we will call you to review the results.   Testing/Procedures:  Echocardiogram   Your physician has requested that you have an echocardiogram. Echocardiography is a painless test that uses sound waves to create images of your heart. It provides your doctor with information about the size and shape of your heart and how well your heart's chambers and valves are working. This procedure takes approximately one  hour. There are no restrictions for this procedure. Please note; depending on visual quality an IV may need to be placed.     Follow-Up: At [facility], you and your health needs are our priority.  As part of our continuing mission to provide you with exceptional heart care, we have created designated Provider Care Teams.  These Care Teams include your primary Cardiologist (physician) and Advanced Practice Providers (APPs -  Physician Assistants and Nurse Practitioners) who all work together to provide you with the care you need, when you need it.  We recommend signing up for the patient portal called "MyChart".  Sign up information is provided on this After Visit Summary.  MyChart is used to connect with patients for Virtual Visits (Telemedicine).  Patients are able to view lab/test results, encounter notes, upcoming appointments, etc.  Non-urgent messages can be sent to your provider as well.   To learn more about what you can do with MyChart, go to [email or web address].    Your next appointment:    After Echocardiogram  The format for your next appointment:   In Person  Provider:   You may see Kate Sable, MD or one of the following Advanced Practice Providers on your designated Care Team:   Murray Hodgkins, NP Christell Faith, PA-C Cadence Kathlen Mody, PA-C Gerrie Nordmann, NP       Signed, Kate Sable, MD  03/23/2022 11:56 AM    [place]

## 2022-03-23 NOTE — Patient Instructions (Signed)
Medication Instructions:   Your physician recommends that you continue on your current medications as directed. Please refer to the Current Medication list given to you today.  *If you need a refill on your cardiac medications before your next appointment, please call your pharmacy*   Lab Work:  None Ordered  If you have labs (blood work) drawn today and your tests are completely normal, you will receive your results only by: MyChart Message (if you have MyChart) OR A paper copy in the mail If you have any lab test that is abnormal or we need to change your treatment, we will call you to review the results.   Testing/Procedures:  Echocardiogram   Your physician has requested that you have an echocardiogram. Echocardiography is a painless test that uses sound waves to create images of your heart. It provides your doctor with information about the size and shape of your heart and how well your heart's chambers and valves are working. This procedure takes approximately one hour. There are no restrictions for this procedure. Please note; depending on visual quality an IV may need to be placed.     Follow-Up: At [facility], you and your health needs are our priority.  As part of our continuing mission to provide you with exceptional heart care, we have created designated Provider Care Teams.  These Care Teams include your primary Cardiologist (physician) and Advanced Practice Providers (APPs -  Physician Assistants and Nurse Practitioners) who all work together to provide you with the care you need, when you need it.  We recommend signing up for the patient portal called "MyChart".  Sign up information is provided on this After Visit Summary.  MyChart is used to connect with patients for Virtual Visits (Telemedicine).  Patients are able to view lab/test results, encounter notes, upcoming appointments, etc.  Non-urgent messages can be sent to your provider as well.   To learn more  about what you can do with MyChart, go to [email or web address].    Your next appointment:    After Echocardiogram  The format for your next appointment:   In Person  Provider:   You may see Brian Agbor-Etang, MD or one of the following Advanced Practice Providers on your designated Care Team:   Christopher Berge, NP Ryan Dunn, PA-C Cadence Furth, PA-C Sheri Hammock, NP     

## 2022-04-17 ENCOUNTER — Other Ambulatory Visit: Payer: Medicare Other

## 2022-04-17 DIAGNOSIS — D649 Anemia, unspecified: Secondary | ICD-10-CM

## 2022-04-18 LAB — CBC WITH DIFFERENTIAL/PLATELET
Basophils Absolute: 0.1 10*3/uL (ref 0.0–0.2)
Basos: 1 %
EOS (ABSOLUTE): 1 10*3/uL — ABNORMAL HIGH (ref 0.0–0.4)
Eos: 14 %
Hematocrit: 34.5 % (ref 34.0–46.6)
Hemoglobin: 11.1 g/dL (ref 11.1–15.9)
Immature Grans (Abs): 0 10*3/uL (ref 0.0–0.1)
Immature Granulocytes: 0 %
Lymphocytes Absolute: 2.4 10*3/uL (ref 0.7–3.1)
Lymphs: 34 %
MCH: 30.2 pg (ref 26.6–33.0)
MCHC: 32.2 g/dL (ref 31.5–35.7)
MCV: 94 fL (ref 79–97)
Monocytes Absolute: 0.7 10*3/uL (ref 0.1–0.9)
Monocytes: 9 %
Neutrophils Absolute: 2.9 10*3/uL (ref 1.4–7.0)
Neutrophils: 42 %
Platelets: 241 10*3/uL (ref 150–450)
RBC: 3.67 x10E6/uL — ABNORMAL LOW (ref 3.77–5.28)
RDW: 12.9 % (ref 11.7–15.4)
WBC: 7 10*3/uL (ref 3.4–10.8)

## 2022-04-21 ENCOUNTER — Encounter: Payer: Self-pay | Admitting: Family Medicine

## 2022-05-11 DIAGNOSIS — D2261 Melanocytic nevi of right upper limb, including shoulder: Secondary | ICD-10-CM | POA: Diagnosis not present

## 2022-05-11 DIAGNOSIS — C44319 Basal cell carcinoma of skin of other parts of face: Secondary | ICD-10-CM | POA: Diagnosis not present

## 2022-05-11 DIAGNOSIS — X32XXXA Exposure to sunlight, initial encounter: Secondary | ICD-10-CM | POA: Diagnosis not present

## 2022-05-11 DIAGNOSIS — L570 Actinic keratosis: Secondary | ICD-10-CM | POA: Diagnosis not present

## 2022-05-11 DIAGNOSIS — C44519 Basal cell carcinoma of skin of other part of trunk: Secondary | ICD-10-CM | POA: Diagnosis not present

## 2022-05-11 DIAGNOSIS — D2271 Melanocytic nevi of right lower limb, including hip: Secondary | ICD-10-CM | POA: Diagnosis not present

## 2022-05-11 DIAGNOSIS — D485 Neoplasm of uncertain behavior of skin: Secondary | ICD-10-CM | POA: Diagnosis not present

## 2022-05-11 DIAGNOSIS — Z85828 Personal history of other malignant neoplasm of skin: Secondary | ICD-10-CM | POA: Diagnosis not present

## 2022-05-11 DIAGNOSIS — D2262 Melanocytic nevi of left upper limb, including shoulder: Secondary | ICD-10-CM | POA: Diagnosis not present

## 2022-05-15 DIAGNOSIS — H524 Presbyopia: Secondary | ICD-10-CM | POA: Diagnosis not present

## 2022-05-23 ENCOUNTER — Ambulatory Visit: Payer: Medicare Other | Attending: Cardiology

## 2022-05-23 DIAGNOSIS — R0602 Shortness of breath: Secondary | ICD-10-CM | POA: Diagnosis not present

## 2022-05-24 LAB — ECHOCARDIOGRAM COMPLETE
AR max vel: 1.98 cm2
AV Area VTI: 1.87 cm2
AV Area mean vel: 2.04 cm2
AV Mean grad: 5 mmHg
AV Peak grad: 8.9 mmHg
Ao pk vel: 1.49 m/s
Area-P 1/2: 2.68 cm2
Calc EF: 50.5 %
S' Lateral: 3.5 cm
Single Plane A2C EF: 50.7 %
Single Plane A4C EF: 51.4 %

## 2022-05-29 ENCOUNTER — Encounter: Payer: Self-pay | Admitting: Cardiology

## 2022-05-29 ENCOUNTER — Ambulatory Visit: Payer: Medicare Other | Attending: Cardiology | Admitting: Cardiology

## 2022-05-29 ENCOUNTER — Telehealth: Payer: Self-pay | Admitting: Cardiology

## 2022-05-29 VITALS — BP 160/68 | HR 72 | Ht 65.5 in | Wt 164.6 lb

## 2022-05-29 DIAGNOSIS — I10 Essential (primary) hypertension: Secondary | ICD-10-CM | POA: Diagnosis not present

## 2022-05-29 DIAGNOSIS — R0602 Shortness of breath: Secondary | ICD-10-CM

## 2022-05-29 DIAGNOSIS — E7800 Pure hypercholesterolemia, unspecified: Secondary | ICD-10-CM | POA: Diagnosis not present

## 2022-05-29 NOTE — Progress Notes (Signed)
Cardiology Office Note:    Date:  05/29/2022   ID:  Faith Cox, DOB Aug 29, 1929, MRN [id number]  PCP:  Valerie Roys, DO   [facility] Cardiologist:  Kate Sable, MD     Referring MD: Valerie Roys, DO   Chief Complaint  Patient presents with   Follow-up    Testing F/u, no new cardiac concerns     History of Present Illness:    Faith Cox is a 89 y.o. female with a hx of hypertension, hyperlipidemia presenting for follow-up.  Previously seen due to shortness of breath.  Diagnosed with anemia, started on iron with improvement in shortness of breath.  Echocardiogram was obtained to evaluate any significant structural abnormalities.  Patient states feeling well, stays very active riding her bike. No new concerns at this time.  Wondering if he was okay to stop aspirin.  Past Medical History:  Diagnosis Date   Cancer ([place])    skin   Hyperlipidemia    Hypertension     Past Surgical History:  Procedure Laterality Date   ABDOMINAL HYSTERECTOMY     APPENDECTOMY     BASAL CELL CARCINOMA EXCISION Right 12/24/2019   Right chin   BASAL CELL CARCINOMA EXCISION Right 01/22/2020, 01/29/20   Right post auricular   BASAL CELL CARCINOMA EXCISION  06/30/2020   bladder repair     BREAST EXCISIONAL BIOPSY Left 08/09/1978   neg   GALLBLADDER SURGERY     TOE SURGERY      Current Medications: Current Meds  Medication Sig   albuterol (VENTOLIN HFA) 108 (90 Base) MCG/ACT inhaler Inhale 2 puffs into the lungs every 6 (six) hours as needed for wheezing or shortness of breath.   aspirin 81 MG tablet Take 81 mg by mouth daily.   Calcium Carb-Cholecalciferol [phone] MG-UNIT CAPS Take by mouth.   Ferrous Sulfate (IRON) 325 (65 Fe) MG TABS Take 1 tablet (325 mg total) by mouth 2 (two) times daily.   hydrALAZINE (APRESOLINE) 25 MG tablet TAKE 1 TABLET BY MOUTH IN THE MORNING AND AT BEDTIME   indapamide (LOZOL) 2.5 MG tablet Take 1 tablet (2.5 mg total) by  mouth daily.   lisinopril (ZESTRIL) 40 MG tablet Take 1 tablet (40 mg total) by mouth daily.   Omega-3 Fatty Acids (FISH OIL) 1000 MG CPDR Take by mouth.   simvastatin (ZOCOR) 20 MG tablet Take 1 tablet (20 mg total) by mouth at bedtime.   vitamin B-12 (CYANOCOBALAMIN) 1000 MCG tablet Take 1,000 mcg by mouth daily.     Allergies:   Amlodipine   Social History   Socioeconomic History   Marital status: Married    Spouse name: Not on file   Number of children: Not on file   Years of education: Not on file   Highest education level: Not on file  Occupational History   Occupation: retired  Tobacco Use   Smoking status: Former    Types: Cigarettes    Quit date: 04/11/1979    Years since quitting: 43.1   Smokeless tobacco: Never   Tobacco comments:    quit 30 years ago   Vaping Use   Vaping Use: Never used  Substance and Sexual Activity   Alcohol use: Yes    Alcohol/week: 0.0 standard drinks of alcohol    Comment: beer on occasion   Drug use: No   Sexual activity: Not Currently  Other Topics Concern   Not on file  Social History Narrative  Not on file   Social Determinants of Health   Financial Resource Strain: Low Risk  (06/30/2021)   Overall Financial Resource Strain (CARDIA)    Difficulty of Paying Living Expenses: Not hard at all  Food Insecurity: No Food Insecurity (06/30/2021)   Hunger Vital Sign    Worried About Running Out of Food in the Last Year: Never true    Ran Out of Food in the Last Year: Never true  Transportation Needs: No Transportation Needs (06/30/2021)   PRAPARE - Hydrologist (Medical): No    Lack of Transportation (Non-Medical): No  Physical Activity: Sufficiently Active (06/30/2021)   Exercise Vital Sign    Days of Exercise per Week: 5 days    Minutes of Exercise per Session: 50 min  Stress: No Stress Concern Present (06/30/2021)   [place]    Feeling  of Stress : Not at all  Social Connections: Socially Isolated (06/30/2021)   Social Connection and Isolation Panel [NHANES]    Frequency of Communication with Friends and Family: More than three times a week    Frequency of Social Gatherings with Friends and Family: Once a week    Attends Religious Services: Never    Marine scientist or Organizations: No    Attends Archivist Meetings: Never    Marital Status: Widowed     Family History: The patient's family history includes Alzheimer's disease in her paternal grandmother; Leukemia in her brother; Pancreatic cancer in her mother; Stroke in her father. There is no history of Breast cancer.  ROS:   Please see the history of present illness.     All other systems reviewed and are negative.  EKGs/Labs/Other Studies Reviewed:    The following studies were reviewed today:   EKG:  EKG not ordered today.   Recent Labs: 01/30/2022: ALT 9; BNP 54.0; BUN 16; Creatinine, Ser 1.22; Potassium 3.5; Sodium 132; TSH 0.940 04/17/2022: Hemoglobin 11.1; Platelets 241  Recent Lipid Panel    Component Value Date/Time   CHOL 137 01/09/2022 0852   CHOL 166 12/08/2015 0902   TRIG 151 (H) 01/09/2022 0852   TRIG 149 12/08/2015 0902   HDL 49 01/09/2022 0852   VLDL 30 (H) 12/08/2015 0902   LDLCALC 62 01/09/2022 0852     Risk Assessment/Calculations:     HYPERTENSION CONTROL Vitals:   05/29/22 1111 05/29/22 1113  BP: (!) 162/72 (!) 160/68    The patient's blood pressure is elevated above target today.  In order to address the patient's elevated BP: The blood pressure is usually elevated in clinic.  Blood pressures monitored at home have been optimal.            Physical Exam:    VS:  BP (!) 160/68 (BP Location: Left Arm, Patient Position: Sitting, Cuff Size: Normal)   Pulse 72   Ht 5' 5.5" (1.664 m)   Wt 164 lb 9.6 oz (74.7 kg)   SpO2 97%   BMI 26.97 kg/m     Wt Readings from Last 3 Encounters:  05/29/22 164 lb 9.6  oz (74.7 kg)  03/23/22 165 lb (74.8 kg)  02/23/22 166 lb 11.2 oz (75.6 kg)     GEN:  Well nourished, well developed in no acute distress HEENT: Normal NECK: No JVD; No carotid bruits CARDIAC: RRR, no murmurs, rubs, gallops RESPIRATORY:  Clear to auscultation without rales, wheezing or rhonchi  ABDOMEN: Soft, non-tender, non-distended MUSCULOSKELETAL:  No edema; No deformity  SKIN: Warm and dry NEUROLOGIC:  Alert and oriented x 3 PSYCHIATRIC:  Normal affect   ASSESSMENT:    1. Shortness of breath   2. White coat syndrome with hypertension   3. Pure hypercholesterolemia     PLAN:    In order of problems listed above:  Shortness of breath, echo with normal EF 55 to 60%.  Symptoms resolved with taking iron., likely from acute illness versus anemia.  Patient made aware of results, reassured. Hypertension, BP elevated, controlled at home.  Patient has whitecoat syndrome.  Continue current meds as prescribed. Hyperlipidemia, continue statin.  Okay to stop aspirin.  Follow-up as needed.      Medication Adjustments/Labs and Tests Ordered: Current medicines are reviewed at length with the patient today.  Concerns regarding medicines are outlined above.  No orders of the defined types were placed in this encounter.  No orders of the defined types were placed in this encounter.   Patient Instructions  Medication Instructions:   Your physician recommends that you continue on your current medications as directed. Please refer to the Current Medication list given to you today.  *If you need a refill on your cardiac medications before your next appointment, please call your pharmacy*   Lab Work:  None Ordered  If you have labs (blood work) drawn today and your tests are completely normal, you will receive your results only by: [place] (if you have MyChart) OR A paper copy in the mail If you have any lab test that is abnormal or we need to change your treatment, we  will call you to review the results.   Testing/Procedures:  None Ordered   Follow-Up: At [facility], you and your health needs are our priority.  As part of our continuing mission to provide you with exceptional heart care, we have created designated Provider Care Teams.  These Care Teams include your primary Cardiologist (physician) and Advanced Practice Providers (APPs -  Physician Assistants and Nurse Practitioners) who all work together to provide you with the care you need, when you need it.  We recommend signing up for the patient portal called "MyChart".  Sign up information is provided on this After Visit Summary.  MyChart is used to connect with patients for Virtual Visits (Telemedicine).  Patients are able to view lab/test results, encounter notes, upcoming appointments, etc.  Non-urgent messages can be sent to your provider as well.   To learn more about what you can do with MyChart, go to [email or web address].    Your next appointment:    As needed   Signed, Kate Sable, MD  05/29/2022 11:46 AM    [place]

## 2022-05-29 NOTE — Telephone Encounter (Signed)
Pt called in asking for her AVS to be mailed to her if possible

## 2022-05-29 NOTE — Telephone Encounter (Signed)
AVS has been printed and mailed.

## 2022-05-29 NOTE — Patient Instructions (Signed)
Medication Instructions:   Your physician recommends that you continue on your current medications as directed. Please refer to the Current Medication list given to you today.  *If you need a refill on your cardiac medications before your next appointment, please call your pharmacy*   Lab Work:  None Ordered  If you have labs (blood work) drawn today and your tests are completely normal, you will receive your results only by: [facility] (if you have MyChart) OR A paper copy in the mail If you have any lab test that is abnormal or we need to change your treatment, we will call you to review the results.   Testing/Procedures:  None Ordered   Follow-Up: At [facility], you and your health needs are our priority.  As part of our continuing mission to provide you with exceptional heart care, we have created designated Provider Care Teams.  These Care Teams include your primary Cardiologist (physician) and Advanced Practice Providers (APPs -  Physician Assistants and Nurse Practitioners) who all work together to provide you with the care you need, when you need it.  We recommend signing up for the patient portal called "MyChart".  Sign up information is provided on this After Visit Summary.  MyChart is used to connect with patients for Virtual Visits (Telemedicine).  Patients are able to view lab/test results, encounter notes, upcoming appointments, etc.  Non-urgent messages can be sent to your provider as well.   To learn more about what you can do with MyChart, go to [email or web address].    Your next appointment:    As needed

## 2022-06-20 ENCOUNTER — Telehealth: Payer: Self-pay | Admitting: Family Medicine

## 2022-06-20 NOTE — Telephone Encounter (Signed)
Contacted [name] to schedule their annual wellness visit. Appointment made for 07/17/2022.  Sherol Dade; Care Guide [facility] Direct Dial: [phone]

## 2022-06-29 DIAGNOSIS — L905 Scar conditions and fibrosis of skin: Secondary | ICD-10-CM | POA: Diagnosis not present

## 2022-06-29 DIAGNOSIS — C44319 Basal cell carcinoma of skin of other parts of face: Secondary | ICD-10-CM | POA: Diagnosis not present

## 2022-07-11 ENCOUNTER — Telehealth: Payer: Self-pay

## 2022-07-11 ENCOUNTER — Encounter: Payer: Self-pay | Admitting: Family Medicine

## 2022-07-11 ENCOUNTER — Ambulatory Visit (INDEPENDENT_AMBULATORY_CARE_PROVIDER_SITE_OTHER): Payer: Medicare Other | Admitting: Family Medicine

## 2022-07-11 VITALS — BP 148/69 | HR 66 | Temp 97.3°F | Ht 65.5 in | Wt 164.9 lb

## 2022-07-11 DIAGNOSIS — M810 Age-related osteoporosis without current pathological fracture: Secondary | ICD-10-CM

## 2022-07-11 DIAGNOSIS — I700 Atherosclerosis of aorta: Secondary | ICD-10-CM

## 2022-07-11 DIAGNOSIS — D692 Other nonthrombocytopenic purpura: Secondary | ICD-10-CM

## 2022-07-11 DIAGNOSIS — I129 Hypertensive chronic kidney disease with stage 1 through stage 4 chronic kidney disease, or unspecified chronic kidney disease: Secondary | ICD-10-CM | POA: Diagnosis not present

## 2022-07-11 DIAGNOSIS — Z0000 Encounter for general adult medical examination without abnormal findings: Secondary | ICD-10-CM

## 2022-07-11 DIAGNOSIS — E782 Mixed hyperlipidemia: Secondary | ICD-10-CM | POA: Diagnosis not present

## 2022-07-11 LAB — URINALYSIS, ROUTINE W REFLEX MICROSCOPIC
Bilirubin, UA: NEGATIVE
Glucose, UA: NEGATIVE
Ketones, UA: NEGATIVE
Leukocytes,UA: NEGATIVE
Nitrite, UA: NEGATIVE
Protein,UA: NEGATIVE
RBC, UA: NEGATIVE
Specific Gravity, UA: 1.01 (ref 1.005–1.030)
Urobilinogen, Ur: 0.2 mg/dL (ref 0.2–1.0)
pH, UA: 6.5 (ref 5.0–7.5)

## 2022-07-11 LAB — MICROALBUMIN, URINE WAIVED
Creatinine, Urine Waived: 50 mg/dL (ref 10–300)
Microalb, Ur Waived: 10 mg/L (ref 0–19)

## 2022-07-11 MED ORDER — INDAPAMIDE 2.5 MG PO TABS: 2.5000 mg | ORAL_TABLET | Freq: Every day | ORAL | 1 refills | Status: DC

## 2022-07-11 MED ORDER — LISINOPRIL 40 MG PO TABS: 40.0000 mg | ORAL_TABLET | Freq: Every day | ORAL | 1 refills | Status: DC

## 2022-07-11 MED ORDER — ALBUTEROL SULFATE HFA 108 (90 BASE) MCG/ACT IN AERS: 2.0000 | INHALATION_SPRAY | Freq: Four times a day (QID) | RESPIRATORY_TRACT | 6 refills | Status: AC | PRN

## 2022-07-11 MED ORDER — SIMVASTATIN 20 MG PO TABS: 20.0000 mg | ORAL_TABLET | Freq: Every day | ORAL | 1 refills | Status: DC

## 2022-07-11 MED ORDER — HYDRALAZINE HCL 25 MG PO TABS: ORAL_TABLET | ORAL | 1 refills | Status: DC

## 2022-07-11 NOTE — Patient Instructions (Signed)
Preventative Services:  Health Risk Assessment and Personalized Prevention Plan: Done today Bone Mass Measurements: ordered today  Breast Cancer Screening: N/A CVD Screening: Done today Cervical Cancer Screening: N/A Colon Cancer Screening: N/A Depression Screening: Done today Diabetes Screening: Done today Glaucoma Screening: See your eye doctor Hepatitis B vaccine: N/A Hepatitis C screening: up to date  HIV Screening: up to date  Flu Vaccine: up to date  Lung cancer Screening: up to date  Obesity Screening: Done today Pneumonia Vaccines (2): Up to date STI Screening: N/A

## 2022-07-11 NOTE — Assessment & Plan Note (Signed)
Under good control on current regimen. Continue current regimen. Continue to monitor. Call with any concerns. Refills given. Labs drawn today.   

## 2022-07-11 NOTE — Assessment & Plan Note (Signed)
Reassured patient. Continue to monitor. Call with any concerns.  

## 2022-07-11 NOTE — Telephone Encounter (Signed)
Handicap Placard was completed and printed off and placed in provider's folder for signature.

## 2022-07-11 NOTE — Telephone Encounter (Signed)
-----   Message from Valerie Roys, DO sent at 07/11/2022  9:25 AM EDT ----- Handicap placard

## 2022-07-11 NOTE — Assessment & Plan Note (Signed)
Recheck osteoporosis. Await results.

## 2022-07-11 NOTE — Progress Notes (Signed)
BP (!) 148/69 (BP Location: Left Arm, Cuff Size: Normal)   Pulse 66   Temp (!) 97.3 F (36.3 C) (Oral)   Ht 5' 5.5" (1.664 m)   Wt 164 lb 14.4 oz (74.8 kg)   SpO2 99%   BMI 27.02 kg/m    Subjective:    Patient ID: Faith Cox, female    DOB: 1929/11/21, 89 y.o.   MRN: [id number]  HPI: JULIEN BEERE is a 89 y.o. female presenting on 07/11/2022 for comprehensive medical examination. Current medical complaints include:  HYPERTENSION / HYPERLIPIDEMIA Satisfied with current treatment? yes Duration of hypertension: chronic BP monitoring frequency: not checking BP medication side effects: no Past BP meds: hydralazine, indapamide, lisinopril Duration of hyperlipidemia: chronic Cholesterol medication side effects: no Cholesterol supplements: fish oil Past cholesterol medications: simvastatin Medication compliance: excellent compliance Aspirin: yes Recent stressors: no Recurrent headaches: no Visual changes: no Palpitations: no Dyspnea: no Chest pain: no Lower extremity edema: no Dizzy/lightheaded: no  She currently lives with: alone Menopausal Symptoms: no  Functional Status Survey: Is the patient deaf or have difficulty hearing?: Yes Does the patient have difficulty seeing, even when wearing glasses/contacts?: No Does the patient have difficulty concentrating, remembering, or making decisions?: No Does the patient have difficulty walking or climbing stairs?: Yes Does the patient have difficulty dressing or bathing?: No Does the patient have difficulty doing errands alone such as visiting a doctor's office or shopping?: No     07/11/2022    8:53 AM 02/23/2022   11:33 AM 01/30/2022    2:58 PM 01/09/2022    8:28 AM 07/07/2021    8:19 AM  [place] in the past year? 0 0 0 0 0  Number falls in past yr: 0 0 0 0 0  Injury with Fall? 0 0 0 0 0  Risk for fall due to : No Fall Risks No Fall Risks No Fall Risks No Fall Risks No Fall Risks  Follow up Falls evaluation  completed Falls evaluation completed Falls evaluation completed Falls evaluation completed Falls evaluation completed    Depression Screen    07/11/2022    8:54 AM 02/23/2022   11:34 AM 02/09/2022    9:46 AM 01/30/2022    2:58 PM 01/09/2022    8:33 AM  Depression screen PHQ 2/9  Decreased Interest 0 0 0 0 0  Down, Depressed, Hopeless 0 0 0 0 0  PHQ - 2 Score 0 0 0 0 0  Altered sleeping 0 0 0 0 0  Tired, decreased energy 0 0 3 0 0  Change in appetite 0 0 2 0 0  Feeling bad or failure about yourself  0 0 0 0 0  Trouble concentrating 0 0 0 0 0  Moving slowly or fidgety/restless 0 0 0 0 0  Suicidal thoughts 0 0 0 0 0  PHQ-9 Score 0 0 5 0 0  Difficult doing work/chores  Not difficult at all  Not difficult at all Not difficult at all    Advanced Directives Does patient have a HCPOA?    yes If yes, name and contact information:  Does patient have a living will or MOST form?  yes  Past Medical History:  Past Medical History:  Diagnosis Date   Cancer    skin   Hyperlipidemia    Hypertension     Surgical History:  Past Surgical History:  Procedure Laterality Date   ABDOMINAL HYSTERECTOMY     APPENDECTOMY  BASAL CELL CARCINOMA EXCISION Right 12/24/2019   Right chin   BASAL CELL CARCINOMA EXCISION Right 01/22/2020, 01/29/20   Right post auricular   BASAL CELL CARCINOMA EXCISION  06/30/2020   bladder repair     BREAST EXCISIONAL BIOPSY Left 08/09/1978   neg   GALLBLADDER SURGERY     TOE SURGERY      Medications:  Current Outpatient Medications on File Prior to Visit  Medication Sig   aspirin 81 MG tablet Take 81 mg by mouth daily.   Calcium Carb-Cholecalciferol [phone] MG-UNIT CAPS Take by mouth.   Ferrous Sulfate (IRON) 325 (65 Fe) MG TABS Take 1 tablet (325 mg total) by mouth 2 (two) times daily.   Omega-3 Fatty Acids (FISH OIL) 1000 MG CPDR Take by mouth.   vitamin B-12 (CYANOCOBALAMIN) 1000 MCG tablet Take 1,000 mcg by mouth daily.   No current  facility-administered medications on file prior to visit.    Allergies:  Allergies  Allergen Reactions   Amlodipine Swelling    Social History:  Social History   Socioeconomic History   Marital status: Married    Spouse name: Not on file   Number of children: Not on file   Years of education: Not on file   Highest education level: Not on file  Occupational History   Occupation: retired  Tobacco Use   Smoking status: Former    Types: Cigarettes    Quit date: 04/11/1979    Years since quitting: 43.2   Smokeless tobacco: Never   Tobacco comments:    quit 30 years ago   Vaping Use   Vaping Use: Never used  Substance and Sexual Activity   Alcohol use: Yes    Alcohol/week: 0.0 standard drinks of alcohol    Comment: beer on occasion   Drug use: No   Sexual activity: Not Currently  Other Topics Concern   Not on file  Social History Narrative   Not on file   Social Determinants of Health   Financial Resource Strain: Low Risk  (06/30/2021)   Overall Financial Resource Strain (CARDIA)    Difficulty of Paying Living Expenses: Not hard at all  Food Insecurity: No Food Insecurity (06/30/2021)   Hunger Vital Sign    Worried About Running Out of Food in the Last Year: Never true    Ran Out of Food in the Last Year: Never true  Transportation Needs: No Transportation Needs (06/30/2021)   PRAPARE - Hydrologist (Medical): No    Lack of Transportation (Non-Medical): No  Physical Activity: Sufficiently Active (06/30/2021)   Exercise Vital Sign    Days of Exercise per Week: 5 days    Minutes of Exercise per Session: 50 min  Stress: No Stress Concern Present (06/30/2021)   [place]    Feeling of Stress : Not at all  Social Connections: Socially Isolated (06/30/2021)   Social Connection and Isolation Panel [NHANES]    Frequency of Communication with Friends and Family: More than three times  a week    Frequency of Social Gatherings with Friends and Family: Once a week    Attends Religious Services: Never    Marine scientist or Organizations: No    Attends Archivist Meetings: Never    Marital Status: Widowed  Intimate Partner Violence: Not At Risk (06/30/2021)   Humiliation, Afraid, Rape, and Kick questionnaire    Fear of Current or Ex-Partner: No  Emotionally Abused: No    Physically Abused: No    Sexually Abused: No   Social History   Tobacco Use  Smoking Status Former   Types: Cigarettes   Quit date: 04/11/1979   Years since quitting: 43.2  Smokeless Tobacco Never  Tobacco Comments   quit 30 years ago    Social History   Substance and Sexual Activity  Alcohol Use Yes   Alcohol/week: 0.0 standard drinks of alcohol   Comment: beer on occasion    Family History:  Family History  Problem Relation Age of Onset   Pancreatic cancer Mother    Stroke Father    Alzheimer's disease Paternal Grandmother    Leukemia Brother    Breast cancer Neg Hx     Past medical history, surgical history, medications, allergies, family history and social history reviewed with patient today and changes made to appropriate areas of the chart.   Review of Systems  Constitutional:  Positive for malaise/fatigue. Negative for chills, diaphoresis, fever and weight loss.  HENT: Negative.    Eyes: Negative.   Respiratory: Negative.    Cardiovascular: Negative.   Gastrointestinal: Negative.   Genitourinary: Negative.   Musculoskeletal: Negative.   Skin: Negative.   Neurological: Negative.   Endo/Heme/Allergies:  Positive for environmental allergies. Negative for polydipsia. Bruises/bleeds easily.  Psychiatric/Behavioral:  Negative for depression, hallucinations, memory loss, substance abuse and suicidal ideas. The patient has insomnia. The patient is not nervous/anxious.     All other ROS negative except what is listed above and in the HPI.      Objective:     BP (!) 148/69 (BP Location: Left Arm, Cuff Size: Normal)   Pulse 66   Temp (!) 97.3 F (36.3 C) (Oral)   Ht 5' 5.5" (1.664 m)   Wt 164 lb 14.4 oz (74.8 kg)   SpO2 99%   BMI 27.02 kg/m   Wt Readings from Last 3 Encounters:  07/11/22 164 lb 14.4 oz (74.8 kg)  05/29/22 164 lb 9.6 oz (74.7 kg)  03/23/22 165 lb (74.8 kg)    Physical Exam Vitals and nursing note reviewed.  Constitutional:      General: She is not in acute distress.    Appearance: Normal appearance. She is normal weight. She is not ill-appearing, toxic-appearing or diaphoretic.  HENT:     Head: Normocephalic and atraumatic.     Right Ear: Tympanic membrane, ear canal and external ear normal. There is no impacted cerumen.     Left Ear: Tympanic membrane, ear canal and external ear normal. There is no impacted cerumen.     Nose: Nose normal. No congestion or rhinorrhea.     Mouth/Throat:     Mouth: Mucous membranes are moist.     Pharynx: Oropharynx is clear. No oropharyngeal exudate or posterior oropharyngeal erythema.  Eyes:     General: No scleral icterus.       Right eye: No discharge.        Left eye: No discharge.     Extraocular Movements: Extraocular movements intact.     Conjunctiva/sclera: Conjunctivae normal.     Pupils: Pupils are equal, round, and reactive to light.  Neck:     Vascular: No carotid bruit.  Cardiovascular:     Rate and Rhythm: Normal rate and regular rhythm.     Pulses: Normal pulses.     Heart sounds: No murmur heard.    No friction rub. No gallop.  Pulmonary:     Effort: Pulmonary effort is  normal. No respiratory distress.     Breath sounds: Normal breath sounds. No stridor. No wheezing, rhonchi or rales.  Chest:     Chest wall: No tenderness.  Abdominal:     General: Abdomen is flat. Bowel sounds are normal. There is no distension.     Palpations: Abdomen is soft. There is no mass.     Tenderness: There is no abdominal tenderness. There is no right CVA tenderness, left CVA  tenderness, guarding or rebound.     Hernia: No hernia is present.  Genitourinary:    Comments: Breast and pelvic exams deferred with shared decision making Musculoskeletal:        General: No swelling, tenderness, deformity or signs of injury.     Cervical back: Normal range of motion and neck supple. No rigidity. No muscular tenderness.     Right lower leg: No edema.     Left lower leg: No edema.  Lymphadenopathy:     Cervical: No cervical adenopathy.  Skin:    General: Skin is warm and dry.     Capillary Refill: Capillary refill takes less than 2 seconds.     Coloration: Skin is not jaundiced or pale.     Findings: No bruising, erythema, lesion or rash.  Neurological:     General: No focal deficit present.     Mental Status: She is alert and oriented to person, place, and time. Mental status is at baseline.     Cranial Nerves: No cranial nerve deficit.     Sensory: No sensory deficit.     Motor: No weakness.     Coordination: Coordination normal.     Gait: Gait normal.     Deep Tendon Reflexes: Reflexes normal.  Psychiatric:        Mood and Affect: Mood normal.        Behavior: Behavior normal.        Thought Content: Thought content normal.        Judgment: Judgment normal.        07/11/2022    9:19 AM 06/28/2020    9:03 AM 06/23/2019    9:43 AM 06/18/2018    9:35 AM 06/15/2017    9:28 AM  6CIT Screen  What Year? 0 points 0 points 0 points 0 points 0 points  What month? 0 points 0 points 0 points 0 points 0 points  What time? 0 points 0 points 0 points 0 points 0 points  Count back from 20 0 points 2 points 0 points 2 points 0 points  Months in reverse 0 points 0 points 0 points 0 points 0 points  Repeat phrase 2 points 6 points 0 points 2 points 2 points  Total Score 2 points 8 points 0 points 4 points 2 points    Results for orders placed or performed in visit on 07/11/22  Urinalysis, Routine w reflex microscopic  Result Value Ref Range   Specific Gravity, UA 1.010  1.005 - 1.030   pH, UA 6.5 5.0 - 7.5   Color, UA Yellow Yellow   Appearance Ur Clear Clear   Leukocytes,UA Negative Negative   Protein,UA Negative Negative/Trace   Glucose, UA Negative Negative   Ketones, UA Negative Negative   RBC, UA Negative Negative   Bilirubin, UA Negative Negative   Urobilinogen, Ur 0.2 0.2 - 1.0 mg/dL   Nitrite, UA Negative Negative   Microscopic Examination Comment   Microalbumin, Urine Waived  Result Value Ref Range   Microalb, Ur Waived 10  0 - 19 mg/L   Creatinine, Urine Waived 50 10 - 300 mg/dL   Microalb/Creat Ratio 30-300 (H) <30 mg/g      Assessment & Plan:   Problem List Items Addressed This Visit       Cardiovascular and Mediastinum   Senile purpura    Reassured patient. Continue to monitor. Call with any concerns.       Relevant Medications   hydrALAZINE (APRESOLINE) 25 MG tablet   indapamide (LOZOL) 2.5 MG tablet   lisinopril (ZESTRIL) 40 MG tablet   simvastatin (ZOCOR) 20 MG tablet   Other Relevant Orders   CBC with Differential/Platelet   Comprehensive metabolic panel   Aortic atherosclerosis    Will keep BP and cholesterol under good control. Conitnue to monitor. Call with any concerns.       Relevant Medications   hydrALAZINE (APRESOLINE) 25 MG tablet   indapamide (LOZOL) 2.5 MG tablet   lisinopril (ZESTRIL) 40 MG tablet   simvastatin (ZOCOR) 20 MG tablet   Other Relevant Orders   Comprehensive metabolic panel   Lipid Panel w/o Chol/HDL Ratio     Musculoskeletal and Integument   Osteoporosis    Recheck osteoporosis. Await results.      Relevant Orders   DG Bone Density     Genitourinary   Benign hypertensive renal disease    Under good control on current regimen. Continue current regimen. Continue to monitor. Call with any concerns. Refills given. Labs drawn today.       Relevant Orders   Comprehensive metabolic panel   Urinalysis, Routine w reflex microscopic (Completed)   TSH   Microalbumin, Urine Waived  (Completed)     Other   Hyperlipidemia    Under good control on current regimen. Continue current regimen. Continue to monitor. Call with any concerns. Refills given. Labs drawn today.      Relevant Medications   hydrALAZINE (APRESOLINE) 25 MG tablet   indapamide (LOZOL) 2.5 MG tablet   lisinopril (ZESTRIL) 40 MG tablet   simvastatin (ZOCOR) 20 MG tablet   Other Relevant Orders   Comprehensive metabolic panel   Lipid Panel w/o Chol/HDL Ratio   Other Visit Diagnoses     Encounter for Medicare annual wellness exam    -  Primary   Preventative care discussed today as below.   Routine general medical examination at a health care facility       Vaccines up to date. Screening labs checked today. DEXA ordered today. Continue diet and exercise. Call with any concerns.        Preventative Services:  Health Risk Assessment and Personalized Prevention Plan: Done today Bone Mass Measurements: ordered today  Breast Cancer Screening: N/A CVD Screening: Done today Cervical Cancer Screening: N/A Colon Cancer Screening: N/A Depression Screening: Done today Diabetes Screening: Done today Glaucoma Screening: See your eye doctor Hepatitis B vaccine: N/A Hepatitis C screening: up to date  HIV Screening: up to date  Flu Vaccine: up to date  Lung cancer Screening: up to date  Obesity Screening: Done today Pneumonia Vaccines (2): Up to date STI Screening: N/A  Follow up plan: Return in about 6 months (around 01/10/2023) for OK to cancel appt in 4/8.   LABORATORY TESTING:  - Pap smear: not applicable  IMMUNIZATIONS:   - Tdap: Tetanus vaccination status reviewed: last tetanus booster within 10 years. - Influenza: Up to date - Pneumovax: Up to date - Prevnar: Up to date - Zostavax vaccine: Not applicable  SCREENING: -Mammogram: Not applicable  -  Colonoscopy: Not applicable  - Bone Density: Ordered today    PATIENT COUNSELING:   Advised to take 1 mg of folate supplement per day  if capable of pregnancy.   Sexuality: Discussed sexually transmitted diseases, partner selection, use of condoms, avoidance of unintended pregnancy  and contraceptive alternatives.   Advised to avoid cigarette smoking.  I discussed with the patient that most people either abstain from alcohol or drink within safe limits (<=14/week and <=4 drinks/occasion for males, <=7/weeks and <= 3 drinks/occasion for females) and that the risk for alcohol disorders and other health effects rises proportionally with the number of drinks per week and how often a drinker exceeds daily limits.  Discussed cessation/primary prevention of drug use and availability of treatment for abuse.   Diet: Encouraged to adjust caloric intake to maintain  or achieve ideal body weight, to reduce intake of dietary saturated fat and total fat, to limit sodium intake by avoiding high sodium foods and not adding table salt, and to maintain adequate dietary potassium and calcium preferably from fresh fruits, vegetables, and low-fat dairy products.    stressed the importance of regular exercise  Injury prevention: Discussed safety belts, safety helmets, smoke detector, smoking near bedding or upholstery.   Dental health: Discussed importance of regular tooth brushing, flossing, and dental visits.    NEXT PREVENTATIVE PHYSICAL DUE IN 1 YEAR. Return in about 6 months (around 01/10/2023) for OK to cancel appt in 4/8.

## 2022-07-11 NOTE — Assessment & Plan Note (Addendum)
Will keep BP and cholesterol under good control. Conitnue to monitor. Call with any concerns.

## 2022-07-12 ENCOUNTER — Encounter: Payer: Self-pay | Admitting: Family Medicine

## 2022-07-12 LAB — CBC WITH DIFFERENTIAL/PLATELET
Basophils Absolute: 0.1 10*3/uL (ref 0.0–0.2)
Basos: 1 %
EOS (ABSOLUTE): 0.6 10*3/uL — ABNORMAL HIGH (ref 0.0–0.4)
Eos: 8 %
Hematocrit: 32 % — ABNORMAL LOW (ref 34.0–46.6)
Hemoglobin: 10.3 g/dL — ABNORMAL LOW (ref 11.1–15.9)
Immature Grans (Abs): 0 10*3/uL (ref 0.0–0.1)
Immature Granulocytes: 0 %
Lymphocytes Absolute: 1.7 10*3/uL (ref 0.7–3.1)
Lymphs: 24 %
MCH: 30.4 pg (ref 26.6–33.0)
MCHC: 32.2 g/dL (ref 31.5–35.7)
MCV: 94 fL (ref 79–97)
Monocytes Absolute: 0.5 10*3/uL (ref 0.1–0.9)
Monocytes: 7 %
Neutrophils Absolute: 4.3 10*3/uL (ref 1.4–7.0)
Neutrophils: 60 %
Platelets: 224 10*3/uL (ref 150–450)
RBC: 3.39 x10E6/uL — ABNORMAL LOW (ref 3.77–5.28)
RDW: 13.1 % (ref 11.7–15.4)
WBC: 7.2 10*3/uL (ref 3.4–10.8)

## 2022-07-12 LAB — LIPID PANEL W/O CHOL/HDL RATIO
Cholesterol, Total: 136 mg/dL (ref 100–199)
HDL: 50 mg/dL (ref >39)
LDL Chol Calc (NIH): 61 mg/dL (ref 0–99)
Triglycerides: 142 mg/dL (ref 0–149)
VLDL Cholesterol Cal: 25 mg/dL (ref 5–40)

## 2022-07-12 LAB — TSH: TSH: 1.57 u[IU]/mL (ref 0.450–4.500)

## 2022-07-12 LAB — COMPREHENSIVE METABOLIC PANEL
ALT: 10 IU/L (ref 0–32)
AST: 15 IU/L (ref 0–40)
Albumin/Globulin Ratio: 1.8 (ref 1.2–2.2)
Albumin: 4.1 g/dL (ref 3.6–4.6)
Alkaline Phosphatase: 78 IU/L (ref 44–121)
BUN/Creatinine Ratio: 25 (ref 12–28)
BUN: 32 mg/dL (ref 10–36)
Bilirubin Total: 0.4 mg/dL (ref 0.0–1.2)
CO2: 22 mmol/L (ref 20–29)
Calcium: 9.6 mg/dL (ref 8.7–10.3)
Chloride: 103 mmol/L (ref 96–106)
Creatinine, Ser: 1.29 mg/dL — ABNORMAL HIGH (ref 0.57–1.00)
Globulin, Total: 2.3 g/dL (ref 1.5–4.5)
Glucose: 89 mg/dL (ref 70–99)
Potassium: 4.3 mmol/L (ref 3.5–5.2)
Sodium: 139 mmol/L (ref 134–144)
Total Protein: 6.4 g/dL (ref 6.0–8.5)
eGFR: 39 mL/min/{1.73_m2} — ABNORMAL LOW (ref >59)

## 2022-07-17 ENCOUNTER — Ambulatory Visit (INDEPENDENT_AMBULATORY_CARE_PROVIDER_SITE_OTHER): Payer: Medicare Other

## 2022-07-17 VITALS — Ht 65.5 in | Wt 164.0 lb

## 2022-07-17 DIAGNOSIS — Z0000 Encounter for general adult medical examination without abnormal findings: Secondary | ICD-10-CM | POA: Diagnosis not present

## 2022-07-17 NOTE — Patient Instructions (Signed)
Ms. Faith Cox , Thank you for taking time to come for your Medicare Wellness Visit. I appreciate your ongoing commitment to your health goals. Please review the following plan we discussed and let me know if I can assist you in the future.   These are the goals we discussed:  Goals      DIET - EAT MORE FRUITS AND VEGETABLES     Patient Stated     06/28/2020, no goals     Patient Stated     No goals        This is a list of the screening recommended for you and due dates:  Health Maintenance  Topic Date Due   Zoster (Shingles) Vaccine (1 of 2) Never done   DEXA scan (bone density measurement)  10/16/2021   COVID-19 Vaccine (4 - 2023-24 season) 12/09/2021   Flu Shot  11/09/2022   Medicare Annual Wellness Visit  07/17/2023   DTaP/Tdap/Td vaccine (2 - Tdap) 06/06/2026   Pneumonia Vaccine  Completed   HPV Vaccine  Aged Out    Advanced directives: no  Conditions/risks identified: none  Next appointment: Follow up in one year for your annual wellness visit 07/23/23 @ 11:15 am by phone   Preventive Care 65 Years and Older, Female Preventive care refers to lifestyle choices and visits with your health care provider that can promote health and wellness. What does preventive care include? A yearly physical exam. This is also called an annual well check. Dental exams once or twice a year. Routine eye exams. Ask your health care provider how often you should have your eyes checked. Personal lifestyle choices, including: Daily care of your teeth and gums. Regular physical activity. Eating a healthy diet. Avoiding tobacco and drug use. Limiting alcohol use. Practicing safe sex. Taking low-dose aspirin every day. Taking vitamin and mineral supplements as recommended by your health care provider. What happens during an annual well check? The services and screenings done by your health care provider during your annual well check will depend on your age, overall health, lifestyle risk  factors, and family history of disease. Counseling  Your health care provider may ask you questions about your: Alcohol use. Tobacco use. Drug use. Emotional well-being. Home and relationship well-being. Sexual activity. Eating habits. History of falls. Memory and ability to understand (cognition). Work and work Astronomer. Reproductive health. Screening  You may have the following tests or measurements: Height, weight, and BMI. Blood pressure. Lipid and cholesterol levels. These may be checked every 5 years, or more frequently if you are over 38 years old. Skin check. Lung cancer screening. You may have this screening every year starting at age 81 if you have a 30-pack-year history of smoking and currently smoke or have quit within the past 15 years. Fecal occult blood test (FOBT) of the stool. You may have this test every year starting at age 80. Flexible sigmoidoscopy or colonoscopy. You may have a sigmoidoscopy every 5 years or a colonoscopy every 10 years starting at age 55. Hepatitis C blood test. Hepatitis B blood test. Sexually transmitted disease (STD) testing. Diabetes screening. This is done by checking your blood sugar (glucose) after you have not eaten for a while (fasting). You may have this done every 1-3 years. Bone density scan. This is done to screen for osteoporosis. You may have this done starting at age 70. Mammogram. This may be done every 1-2 years. Talk to your health care provider about how often you should have regular mammograms. Talk  with your health care provider about your test results, treatment options, and if necessary, the need for more tests. Vaccines  Your health care provider may recommend certain vaccines, such as: Influenza vaccine. This is recommended every year. Tetanus, diphtheria, and acellular pertussis (Tdap, Td) vaccine. You may need a Td booster every 10 years. Zoster vaccine. You may need this after age 39. Pneumococcal 13-valent  conjugate (PCV13) vaccine. One dose is recommended after age 45. Pneumococcal polysaccharide (PPSV23) vaccine. One dose is recommended after age 26. Talk to your health care provider about which screenings and vaccines you need and how often you need them. This information is not intended to replace advice given to you by your health care provider. Make sure you discuss any questions you have with your health care provider. Document Released: 04/23/2015 Document Revised: 12/15/2015 Document Reviewed: 01/26/2015 Elsevier Interactive Patient Education  2017 [employer] Prevention in the Home Falls can cause injuries. They can happen to people of all ages. There are many things you can do to make your home safe and to help prevent falls. What can I do on the outside of my home? Regularly fix the edges of walkways and driveways and fix any cracks. Remove anything that might make you trip as you walk through a door, such as a raised step or threshold. Trim any bushes or trees on the path to your home. Use bright outdoor lighting. Clear any walking paths of anything that might make someone trip, such as rocks or tools. Regularly check to see if handrails are loose or broken. Make sure that both sides of any steps have handrails. Any raised decks and porches should have guardrails on the edges. Have any leaves, snow, or ice cleared regularly. Use sand or salt on walking paths during winter. Clean up any spills in your garage right away. This includes oil or grease spills. What can I do in the bathroom? Use night lights. Install grab bars by the toilet and in the tub and shower. Do not use towel bars as grab bars. Use non-skid mats or decals in the tub or shower. If you need to sit down in the shower, use a plastic, non-slip stool. Keep the floor dry. Clean up any water that spills on the floor as soon as it happens. Remove soap buildup in the tub or shower regularly. Attach bath mats  securely with double-sided non-slip rug tape. Do not have throw rugs and other things on the floor that can make you trip. What can I do in the bedroom? Use night lights. Make sure that you have a light by your bed that is easy to reach. Do not use any sheets or blankets that are too big for your bed. They should not hang down onto the floor. Have a firm chair that has side arms. You can use this for support while you get dressed. Do not have throw rugs and other things on the floor that can make you trip. What can I do in the kitchen? Clean up any spills right away. Avoid walking on wet floors. Keep items that you use a lot in easy-to-reach places. If you need to reach something above you, use a strong step stool that has a grab bar. Keep electrical cords out of the way. Do not use floor polish or wax that makes floors slippery. If you must use wax, use non-skid floor wax. Do not have throw rugs and other things on the floor that can make you  trip. What can I do with my stairs? Do not leave any items on the stairs. Make sure that there are handrails on both sides of the stairs and use them. Fix handrails that are broken or loose. Make sure that handrails are as long as the stairways. Check any carpeting to make sure that it is firmly attached to the stairs. Fix any carpet that is loose or worn. Avoid having throw rugs at the top or bottom of the stairs. If you do have throw rugs, attach them to the floor with carpet tape. Make sure that you have a light switch at the top of the stairs and the bottom of the stairs. If you do not have them, ask someone to add them for you. What else can I do to help prevent falls? Wear shoes that: Do not have high heels. Have rubber bottoms. Are comfortable and fit you well. Are closed at the toe. Do not wear sandals. If you use a stepladder: Make sure that it is fully opened. Do not climb a closed stepladder. Make sure that both sides of the stepladder  are locked into place. Ask someone to hold it for you, if possible. Clearly mark and make sure that you can see: Any grab bars or handrails. First and last steps. Where the edge of each step is. Use tools that help you move around (mobility aids) if they are needed. These include: Canes. Walkers. Scooters. Crutches. Turn on the lights when you go into a dark area. Replace any light bulbs as soon as they burn out. Set up your furniture so you have a clear path. Avoid moving your furniture around. If any of your floors are uneven, fix them. If there are any pets around you, be aware of where they are. Review your medicines with your doctor. Some medicines can make you feel dizzy. This can increase your chance of falling. Ask your doctor what other things that you can do to help prevent falls. This information is not intended to replace advice given to you by your health care provider. Make sure you discuss any questions you have with your health care provider. Document Released: 01/21/2009 Document Revised: 09/02/2015 Document Reviewed: 05/01/2014 Elsevier Interactive Patient Education  2017 [employer].

## 2022-07-17 NOTE — Progress Notes (Signed)
I connected with  [name] on 07/17/22 by a audio enabled telemedicine application and verified that I am speaking with the correct person using two identifiers.  Patient Location: Home  Provider Location: Office/Clinic  I discussed the limitations of evaluation and management by telemedicine. The patient expressed understanding and agreed to proceed.  Subjective:   Faith Cox is a 89 y.o. female who presents for Medicare Annual (Subsequent) preventive examination.  Review of Systems     Cardiac Risk Factors include: advanced age (>29men, >74 women);hypertension     Objective:    There were no vitals filed for this visit. There is no height or weight on file to calculate BMI.     07/17/2022   11:34 AM 06/30/2021    9:03 AM 06/28/2020    9:00 AM 04/22/2020    4:07 PM 06/06/2016    8:54 AM 06/01/2015    8:33 AM  Advanced Directives  Does Patient Have a Medical Advance Directive? No Yes Yes No No No  Type of Advance Directive  Healthcare Power of Attorney Living will     Does patient want to make changes to medical advance directive?  No - Patient declined      Copy of Healthcare Power of Attorney in Chart?  No - copy requested      Would patient like information on creating a medical advance directive? No - Patient declined    Yes (MAU/Ambulatory/Procedural Areas - Information given) Yes - Educational materials given    Current Medications (verified) Outpatient Encounter Medications as of 07/17/2022  Medication Sig   aspirin 81 MG tablet Take 81 mg by mouth daily.   Calcium Carb-Cholecalciferol [phone] MG-UNIT CAPS Take by mouth.   Ferrous Sulfate (IRON) 325 (65 Fe) MG TABS Take 1 tablet (325 mg total) by mouth 2 (two) times daily.   hydrALAZINE (APRESOLINE) 25 MG tablet TAKE 1 TABLET BY MOUTH IN THE MORNING AND AT BEDTIME   indapamide (LOZOL) 2.5 MG tablet Take 1 tablet (2.5 mg total) by mouth daily.   lisinopril (ZESTRIL) 40 MG tablet Take 1 tablet (40 mg total) by  mouth daily.   Omega-3 Fatty Acids (FISH OIL) 1000 MG CPDR Take by mouth.   simvastatin (ZOCOR) 20 MG tablet Take 1 tablet (20 mg total) by mouth at bedtime.   vitamin B-12 (CYANOCOBALAMIN) 1000 MCG tablet Take 1,000 mcg by mouth daily.   albuterol (VENTOLIN HFA) 108 (90 Base) MCG/ACT inhaler Inhale 2 puffs into the lungs every 6 (six) hours as needed for wheezing or shortness of breath. (Patient not taking: Reported on 07/17/2022)   No facility-administered encounter medications on file as of 07/17/2022.    Allergies (verified) Amlodipine   History: Past Medical History:  Diagnosis Date   Cancer    skin   Hyperlipidemia    Hypertension    Past Surgical History:  Procedure Laterality Date   ABDOMINAL HYSTERECTOMY     APPENDECTOMY     BASAL CELL CARCINOMA EXCISION Right 12/24/2019   Right chin   BASAL CELL CARCINOMA EXCISION Right 01/22/2020, 01/29/20   Right post auricular   BASAL CELL CARCINOMA EXCISION  06/30/2020   bladder repair     BREAST EXCISIONAL BIOPSY Left 08/09/1978   neg   GALLBLADDER SURGERY     TOE SURGERY     Family History  Problem Relation Age of Onset   Pancreatic cancer Mother    Stroke Father    Alzheimer's disease Paternal Grandmother    Leukemia Brother  Breast cancer Neg Hx    Social History   Socioeconomic History   Marital status: Married    Spouse name: Not on file   Number of children: Not on file   Years of education: Not on file   Highest education level: Not on file  Occupational History   Occupation: retired  Tobacco Use   Smoking status: Former    Types: Cigarettes    Quit date: 04/11/1979    Years since quitting: 43.2   Smokeless tobacco: Never   Tobacco comments:    quit 30 years ago   Vaping Use   Vaping Use: Never used  Substance and Sexual Activity   Alcohol use: Yes    Alcohol/week: 0.0 standard drinks of alcohol    Comment: beer on occasion   Drug use: No   Sexual activity: Not Currently  Other Topics Concern   Not  on file  Social History Narrative   Not on file   Social Determinants of Health   Financial Resource Strain: Low Risk  (07/17/2022)   Overall Financial Resource Strain (CARDIA)    Difficulty of Paying Living Expenses: Not hard at all  Food Insecurity: No Food Insecurity (07/17/2022)   Hunger Vital Sign    Worried About Running Out of Food in the Last Year: Never true    Ran Out of Food in the Last Year: Never true  Transportation Needs: No Transportation Needs (07/17/2022)   PRAPARE - Administrator, Civil Service (Medical): No    Lack of Transportation (Non-Medical): No  Physical Activity: Insufficiently Active (07/17/2022)   Exercise Vital Sign    Days of Exercise per Week: 7 days    Minutes of Exercise per Session: 20 min  Stress: No Stress Concern Present (07/17/2022)   [employer] of Occupational Health - Occupational Stress Questionnaire    Feeling of Stress : Not at all  Social Connections: Socially Isolated (07/17/2022)   Social Connection and Isolation Panel [NHANES]    Frequency of Communication with Friends and Family: More than three times a week    Frequency of Social Gatherings with Friends and Family: Three times a week    Attends Religious Services: Never    Active Member of Clubs or Organizations: No    Attends Banker Meetings: Never    Marital Status: Widowed    Tobacco Counseling Counseling given: Not Answered Tobacco comments: quit 30 years ago    Clinical Intake:  Pre-visit preparation completed: Yes  Pain : No/denies pain     Nutritional Risks: None Diabetes: No  How often do you need to have someone help you when you read instructions, pamphlets, or other written materials from your doctor or pharmacy?: 1 - Never  Diabetic?no  Interpreter Needed?: No  Information entered by :: Kennedy Bucker, LPN   Activities of Daily Living    07/17/2022   11:35 AM 07/11/2022    9:15 AM  In your present state of health, do you have  any difficulty performing the following activities:  Hearing? 1 1  Vision? 0 0  Difficulty concentrating or making decisions? 0 0  Walking or climbing stairs? 1 1  Dressing or bathing? 0 0  Doing errands, shopping? 0 0  Preparing Food and eating ? N   Using the Toilet? N   In the past six months, have you accidently leaked urine? N   Do you have problems with loss of bowel control? N   Managing your Medications? N  Managing your Finances? N   Housekeeping or managing your Housekeeping? N     Patient Care Team: Dorcas Carrow, DO as PCP - General (Family Medicine) Debbe Odea, MD as PCP - Cardiology (Cardiology) Dasher, Cliffton Asters, MD (Dermatology)  Indicate any recent Medical Services you may have received from other than [facility] providers in the past year (date may be approximate).     Assessment:   This is a routine wellness examination for Jhene.  Hearing/Vision screen Hearing Screening - Comments:: No aids Vision Screening - Comments:: Readers- [employer] [facility]  Dietary issues and exercise activities discussed: Current Exercise Habits: Home exercise routine, Type of exercise: Other - see comments (bike), Time (Minutes): 20, Frequency (Times/Week): 7, Weekly Exercise (Minutes/Week): 140, Intensity: Mild   Goals Addressed             This Visit's Progress    DIET - EAT MORE FRUITS AND VEGETABLES         Depression Screen    07/17/2022   11:33 AM 07/11/2022    8:54 AM 02/23/2022   11:34 AM 02/09/2022    9:46 AM 01/30/2022    2:58 PM 01/09/2022    8:33 AM 08/10/2021    1:08 PM  PHQ 2/9 Scores  PHQ - 2 Score 0 0 0 0 0 0 0  PHQ- 9 Score 0 0 0 5 0 0 0    Fall Risk    07/17/2022   11:35 AM 07/11/2022    8:53 AM 02/23/2022   11:33 AM 01/30/2022    2:58 PM 01/09/2022    8:28 AM  Fall Risk   Falls in the past year? 0 0 0 0 0  Number falls in past yr: 0 0 0 0 0  Injury with Fall? 0 0 0 0 0  Risk for fall due to : No Fall Risks No Fall Risks No Fall  Risks No Fall Risks No Fall Risks  Follow up Falls prevention discussed;Falls evaluation completed Falls evaluation completed Falls evaluation completed Falls evaluation completed Falls evaluation completed    FALL RISK PREVENTION PERTAINING TO THE HOME:  Any stairs in or around the home? Yes  If so, are there any without handrails? No  Home free of loose throw rugs in walkways, pet beds, electrical cords, etc? Yes  Adequate lighting in your home to reduce risk of falls? Yes   ASSISTIVE DEVICES UTILIZED TO PREVENT FALLS:  Life alert? No  Use of a cane, walker or w/c? Yes  Grab bars in the bathroom? Yes  Shower chair or bench in shower? Yes  Elevated toilet seat or a handicapped toilet? Yes    Cognitive Function:        07/17/2022   11:39 AM 07/11/2022    9:19 AM 06/28/2020    9:03 AM 06/23/2019    9:43 AM 06/18/2018    9:35 AM  6CIT Screen  What Year? 0 points 0 points 0 points 0 points 0 points  What month? 0 points 0 points 0 points 0 points 0 points  What time? 0 points 0 points 0 points 0 points 0 points  Count back from 20 0 points 0 points 2 points 0 points 2 points  Months in reverse 0 points 0 points 0 points 0 points 0 points  Repeat phrase 0 points 2 points 6 points 0 points 2 points  Total Score 0 points 2 points 8 points 0 points 4 points    Immunizations Immunization History  Administered  Date(s) Administered   Fluad Quad(high Dose 65+) 12/19/2018, 12/26/2019, 01/07/2021, 01/09/2022   Influenza, High Dose Seasonal PF 12/08/2015, 12/18/2017   Influenza-Unspecified 12/10/2014, 12/09/2016   PFIZER(Purple Top)SARS-COV-2 Vaccination 05/05/2019, 05/26/2019, 01/12/2020   Pneumococcal Conjugate-13 11/09/2014   Pneumococcal Polysaccharide-23 12/08/2015   Td 06/06/2016    TDAP status: Up to date  Flu Vaccine status: Up to date  Pneumococcal vaccine status: Up to date  Covid-19 vaccine status: Completed vaccines  Qualifies for Shingles Vaccine? Yes   Zostavax  completed No   Shingrix Completed?: No.    Education has been provided regarding the importance of this vaccine. Patient has been advised to call insurance company to determine out of pocket expense if they have not yet received this vaccine. Advised may also receive vaccine at local pharmacy or Health Dept. Verbalized acceptance and understanding.  Screening Tests Health Maintenance  Topic Date Due   Zoster Vaccines- Shingrix (1 of 2) Never done   DEXA SCAN  10/16/2021   COVID-19 Vaccine (4 - 2023-24 season) 12/09/2021   INFLUENZA VACCINE  11/09/2022   Medicare Annual Wellness (AWV)  07/17/2023   DTaP/Tdap/Td (2 - Tdap) 06/06/2026   Pneumonia Vaccine 2765+ Years old  Completed   HPV VACCINES  Aged Out    Health Maintenance  Health Maintenance Due  Topic Date Due   Zoster Vaccines- Shingrix (1 of 2) Never done   DEXA SCAN  10/16/2021   COVID-19 Vaccine (4 - 2023-24 season) 12/09/2021    Colorectal cancer screening: No longer required.   Mammogram status: No longer required due to age.  Lung Cancer Screening: (Low Dose CT Chest recommended if Age 64-80 years, 30 pack-year currently smoking OR have quit w/in 15years.) does not qualify.   Additional Screening:  Hepatitis C Screening: does not qualify; Completed no  Vision Screening: Recommended annual ophthalmology exams for early detection of glaucoma and other disorders of the eye. Is the patient up to date with their annual eye exam?  Yes  Who is the provider or what is the name of the office in which the patient attends annual eye exams? [facility] [place] If pt is not established with a provider, would they like to be referred to a provider to establish care? No .   Dental Screening: Recommended annual dental exams for proper oral hygiene  Community Resource Referral / Chronic Care Management: CRR required this visit?  No   CCM required this visit?  No      Plan:     I have personally reviewed and noted the  following in the patient's chart:   Medical and social history Use of alcohol, tobacco or illicit drugs  Current medications and supplements including opioid prescriptions. Patient is not currently taking opioid prescriptions. Functional ability and status Nutritional status Physical activity Advanced directives List of other physicians Hospitalizations, surgeries, and ER visits in previous 12 months Vitals Screenings to include cognitive, depression, and falls Referrals and appointments  In addition, I have reviewed and discussed with patient certain preventive protocols, quality metrics, and best practice recommendations. A written personalized care plan for preventive services as well as general preventive health recommendations were provided to patient.     Hal HopeLorrie S Fernie Grimm, LPN   1/6/10964/11/2022   Nurse Notes: none

## 2022-08-29 ENCOUNTER — Ambulatory Visit
Admission: RE | Admit: 2022-08-29 | Discharge: 2022-08-29 | Disposition: A | Discharge status: Home / Self Care | Payer: Medicare Other | Source: Ambulatory Visit | Attending: Family Medicine | Admitting: Family Medicine

## 2022-08-29 DIAGNOSIS — M810 Age-related osteoporosis without current pathological fracture: Secondary | ICD-10-CM | POA: Diagnosis not present

## 2022-09-05 ENCOUNTER — Other Ambulatory Visit: Payer: Self-pay | Admitting: Family Medicine

## 2022-09-05 MED ORDER — ALENDRONATE SODIUM 70 MG PO TABS
70.0000 mg | ORAL_TABLET | ORAL | 11 refills | Status: DC
Start: 2022-09-05 — End: 2023-07-12

## 2022-10-03 ENCOUNTER — Other Ambulatory Visit: Payer: Self-pay | Admitting: Family Medicine

## 2022-10-03 DIAGNOSIS — Z1231 Encounter for screening mammogram for malignant neoplasm of breast: Secondary | ICD-10-CM

## 2022-11-03 ENCOUNTER — Ambulatory Visit
Admission: RE | Admit: 2022-11-03 | Discharge: 2022-11-03 | Disposition: A | Discharge status: Home / Self Care | Payer: Medicare Other | Source: Ambulatory Visit | Attending: Family Medicine | Admitting: Family Medicine

## 2022-11-03 DIAGNOSIS — Z1231 Encounter for screening mammogram for malignant neoplasm of breast: Secondary | ICD-10-CM | POA: Insufficient documentation

## 2022-11-06 ENCOUNTER — Other Ambulatory Visit: Payer: Self-pay | Admitting: Family Medicine

## 2022-11-06 DIAGNOSIS — R928 Other abnormal and inconclusive findings on diagnostic imaging of breast: Secondary | ICD-10-CM

## 2022-11-06 DIAGNOSIS — N6489 Other specified disorders of breast: Secondary | ICD-10-CM

## 2022-11-13 ENCOUNTER — Ambulatory Visit
Admission: RE | Admit: 2022-11-13 | Discharge: 2022-11-13 | Disposition: A | Discharge status: Home / Self Care | Payer: Medicare Other | Source: Ambulatory Visit | Attending: Family Medicine | Admitting: Family Medicine

## 2022-11-13 DIAGNOSIS — R928 Other abnormal and inconclusive findings on diagnostic imaging of breast: Secondary | ICD-10-CM | POA: Insufficient documentation

## 2022-11-13 DIAGNOSIS — N6489 Other specified disorders of breast: Secondary | ICD-10-CM

## 2022-11-13 DIAGNOSIS — R92333 Mammographic heterogeneous density, bilateral breasts: Secondary | ICD-10-CM | POA: Diagnosis not present

## 2022-11-14 ENCOUNTER — Encounter: Payer: Self-pay | Admitting: Family Medicine

## 2022-12-07 DIAGNOSIS — Z85828 Personal history of other malignant neoplasm of skin: Secondary | ICD-10-CM | POA: Diagnosis not present

## 2022-12-07 DIAGNOSIS — L821 Other seborrheic keratosis: Secondary | ICD-10-CM | POA: Diagnosis not present

## 2022-12-07 DIAGNOSIS — L300 Nummular dermatitis: Secondary | ICD-10-CM | POA: Diagnosis not present

## 2022-12-07 DIAGNOSIS — L570 Actinic keratosis: Secondary | ICD-10-CM | POA: Diagnosis not present

## 2022-12-07 DIAGNOSIS — D485 Neoplasm of uncertain behavior of skin: Secondary | ICD-10-CM | POA: Diagnosis not present

## 2022-12-07 DIAGNOSIS — D044 Carcinoma in situ of skin of scalp and neck: Secondary | ICD-10-CM | POA: Diagnosis not present

## 2022-12-07 DIAGNOSIS — D225 Melanocytic nevi of trunk: Secondary | ICD-10-CM | POA: Diagnosis not present

## 2022-12-10 HISTORY — PX: BASAL CELL CARCINOMA EXCISION: SHX1214

## 2023-01-04 DIAGNOSIS — D044 Carcinoma in situ of skin of scalp and neck: Secondary | ICD-10-CM | POA: Diagnosis not present

## 2023-01-10 ENCOUNTER — Ambulatory Visit (INDEPENDENT_AMBULATORY_CARE_PROVIDER_SITE_OTHER): Payer: Medicare Other | Admitting: Family Medicine

## 2023-01-10 ENCOUNTER — Encounter: Payer: Self-pay | Admitting: Family Medicine

## 2023-01-10 VITALS — BP 166/71 | HR 67 | Ht 65.5 in | Wt 167.4 lb

## 2023-01-10 DIAGNOSIS — D692 Other nonthrombocytopenic purpura: Secondary | ICD-10-CM

## 2023-01-10 DIAGNOSIS — I129 Hypertensive chronic kidney disease with stage 1 through stage 4 chronic kidney disease, or unspecified chronic kidney disease: Secondary | ICD-10-CM | POA: Diagnosis not present

## 2023-01-10 DIAGNOSIS — Z23 Encounter for immunization: Secondary | ICD-10-CM | POA: Diagnosis not present

## 2023-01-10 DIAGNOSIS — I700 Atherosclerosis of aorta: Secondary | ICD-10-CM | POA: Diagnosis not present

## 2023-01-10 DIAGNOSIS — E782 Mixed hyperlipidemia: Secondary | ICD-10-CM

## 2023-01-10 MED ORDER — LISINOPRIL 40 MG PO TABS: 40.0000 mg | ORAL_TABLET | Freq: Every day | ORAL | 1 refills | Status: DC

## 2023-01-10 MED ORDER — INDAPAMIDE 2.5 MG PO TABS: 2.5000 mg | ORAL_TABLET | Freq: Every day | ORAL | 1 refills | Status: DC

## 2023-01-10 MED ORDER — HYDRALAZINE HCL 25 MG PO TABS: ORAL_TABLET | ORAL | 1 refills | Status: DC

## 2023-01-10 MED ORDER — SIMVASTATIN 20 MG PO TABS: 20.0000 mg | ORAL_TABLET | Freq: Every day | ORAL | 1 refills | Status: DC

## 2023-01-10 NOTE — Assessment & Plan Note (Signed)
Will keep BP and cholesterol under good control. Continue to monitor. Call with any concerns.  

## 2023-01-10 NOTE — Progress Notes (Signed)
BP (!) 166/71 (BP Location: Left Arm, Cuff Size: Normal)   Pulse 67   Ht 5' 5.5" (1.664 m)   Wt 167 lb 6.4 oz (75.9 kg)   SpO2 97%   BMI 27.43 kg/m    Subjective:    Patient ID: Faith Cox, female    DOB: January 03, 1930, 89 y.o.   MRN: [id number]  HPI: Faith Cox is a 89 y.o. female  Chief Complaint  Patient presents with   Hyperlipidemia   HYPERTENSION / HYPERLIPIDEMIA Satisfied with current treatment? yes Duration of hypertension: chronic BP monitoring frequency: daily BP range: 100s-130s/50s-60s BP medication side effects: no Past BP meds: lisinopril, indapamide, hydralazine Duration of hyperlipidemia: chronic Cholesterol medication side effects: no Cholesterol supplements: none Past cholesterol medications: simvastatin Medication compliance: excellent compliance Aspirin: no Recent stressors: no Recurrent headaches: no Visual changes: no Palpitations: no Dyspnea: no Chest pain: no Lower extremity edema: no Dizzy/lightheaded: no  Relevant past medical, surgical, family and social history reviewed and updated as indicated. Interim medical history since our last visit reviewed. Allergies and medications reviewed and updated.  Review of Systems  Constitutional: Negative.   Respiratory: Negative.    Cardiovascular: Negative.   Gastrointestinal: Negative.   Musculoskeletal: Negative.   Psychiatric/Behavioral: Negative.      Per HPI unless specifically indicated above     Objective:    BP (!) 166/71 (BP Location: Left Arm, Cuff Size: Normal)   Pulse 67   Ht 5' 5.5" (1.664 m)   Wt 167 lb 6.4 oz (75.9 kg)   SpO2 97%   BMI 27.43 kg/m   Wt Readings from Last 3 Encounters:  01/10/23 167 lb 6.4 oz (75.9 kg)  07/17/22 164 lb (74.4 kg)  07/11/22 164 lb 14.4 oz (74.8 kg)    Physical Exam Vitals and nursing note reviewed.  Constitutional:      General: She is not in acute distress.    Appearance: Normal appearance. She is not ill-appearing,  toxic-appearing or diaphoretic.  HENT:     Head: Normocephalic and atraumatic.     Right Ear: External ear normal.     Left Ear: External ear normal.     Nose: Nose normal.     Mouth/Throat:     Mouth: Mucous membranes are moist.     Pharynx: Oropharynx is clear.  Eyes:     General: No scleral icterus.       Right eye: No discharge.        Left eye: No discharge.     Extraocular Movements: Extraocular movements intact.     Conjunctiva/sclera: Conjunctivae normal.     Pupils: Pupils are equal, round, and reactive to light.  Cardiovascular:     Rate and Rhythm: Normal rate and regular rhythm.     Pulses: Normal pulses.     Heart sounds: Normal heart sounds. No murmur heard.    No friction rub. No gallop.  Pulmonary:     Effort: Pulmonary effort is normal. No respiratory distress.     Breath sounds: Normal breath sounds. No stridor. No wheezing, rhonchi or rales.  Chest:     Chest wall: No tenderness.  Musculoskeletal:        General: Normal range of motion.     Cervical back: Normal range of motion and neck supple.  Skin:    General: Skin is warm and dry.     Capillary Refill: Capillary refill takes less than 2 seconds.     Coloration: Skin is not jaundiced  or pale.     Findings: No bruising, erythema, lesion or rash.  Neurological:     General: No focal deficit present.     Mental Status: She is alert and oriented to person, place, and time. Mental status is at baseline.  Psychiatric:        Mood and Affect: Mood normal.        Behavior: Behavior normal.        Thought Content: Thought content normal.        Judgment: Judgment normal.     Results for orders placed or performed in visit on 07/11/22  CBC with Differential/Platelet  Result Value Ref Range   WBC 7.2 3.4 - 10.8 x10E3/uL   RBC 3.39 (L) 3.77 - 5.28 x10E6/uL   Hemoglobin 10.3 (L) 11.1 - 15.9 g/dL   Hematocrit 41.6 (L) 60.6 - 46.6 %   MCV 94 79 - 97 fL   MCH 30.4 26.6 - 33.0 pg   MCHC 32.2 31.5 - 35.7 g/dL    RDW 30.1 60.1 - 09.3 %   Platelets 224 150 - 450 x10E3/uL   Neutrophils 60 Not Estab. %   Lymphs 24 Not Estab. %   Monocytes 7 Not Estab. %   Eos 8 Not Estab. %   Basos 1 Not Estab. %   Neutrophils Absolute 4.3 1.4 - 7.0 x10E3/uL   Lymphocytes Absolute 1.7 0.7 - 3.1 x10E3/uL   Monocytes Absolute 0.5 0.1 - 0.9 x10E3/uL   EOS (ABSOLUTE) 0.6 (H) 0.0 - 0.4 x10E3/uL   Basophils Absolute 0.1 0.0 - 0.2 x10E3/uL   Immature Granulocytes 0 Not Estab. %   Immature Grans (Abs) 0.0 0.0 - 0.1 x10E3/uL  Comprehensive metabolic panel  Result Value Ref Range   Glucose 89 70 - 99 mg/dL   BUN 32 10 - 36 mg/dL   Creatinine, Ser 2.35 (H) 0.57 - 1.00 mg/dL   eGFR 39 (L) >57 DU/KGU/5.42   BUN/Creatinine Ratio 25 12 - 28   Sodium 139 134 - 144 mmol/L   Potassium 4.3 3.5 - 5.2 mmol/L   Chloride 103 96 - 106 mmol/L   CO2 22 20 - 29 mmol/L   Calcium 9.6 8.7 - 10.3 mg/dL   Total Protein 6.4 6.0 - 8.5 g/dL   Albumin 4.1 3.6 - 4.6 g/dL   Globulin, Total 2.3 1.5 - 4.5 g/dL   Albumin/Globulin Ratio 1.8 1.2 - 2.2   Bilirubin Total 0.4 0.0 - 1.2 mg/dL   Alkaline Phosphatase 78 44 - 121 IU/L   AST 15 0 - 40 IU/L   ALT 10 0 - 32 IU/L  Lipid Panel w/o Chol/HDL Ratio  Result Value Ref Range   Cholesterol, Total 136 100 - 199 mg/dL   Triglycerides 706 0 - 149 mg/dL   HDL 50 >23 mg/dL   VLDL Cholesterol Cal 25 5 - 40 mg/dL   LDL Chol Calc (NIH) 61 0 - 99 mg/dL  Urinalysis, Routine w reflex microscopic  Result Value Ref Range   Specific Gravity, UA 1.010 1.005 - 1.030   pH, UA 6.5 5.0 - 7.5   Color, UA Yellow Yellow   Appearance Ur Clear Clear   Leukocytes,UA Negative Negative   Protein,UA Negative Negative/Trace   Glucose, UA Negative Negative   Ketones, UA Negative Negative   RBC, UA Negative Negative   Bilirubin, UA Negative Negative   Urobilinogen, Ur 0.2 0.2 - 1.0 mg/dL   Nitrite, UA Negative Negative   Microscopic Examination Comment   TSH  Result Value Ref Range   TSH 1.570 0.450 - 4.500  uIU/mL  Microalbumin, Urine Waived  Result Value Ref Range   Microalb, Ur Waived 10 0 - 19 mg/L   Creatinine, Urine Waived 50 10 - 300 mg/dL   Microalb/Creat Ratio 30-300 (H) <30 mg/g      Assessment & Plan:   Problem List Items Addressed This Visit       Cardiovascular and Mediastinum   Senile purpura (HCC)    Reassured patient.       Relevant Medications   hydrALAZINE (APRESOLINE) 25 MG tablet   indapamide (LOZOL) 2.5 MG tablet   lisinopril (ZESTRIL) 40 MG tablet   simvastatin (ZOCOR) 20 MG tablet   Other Relevant Orders   CBC with Differential/Platelet   Comprehensive metabolic panel   Aortic atherosclerosis (HCC) - Primary    Will keep BP and cholesterol under good control. Continue to monitor. Call with any concerns.       Relevant Medications   hydrALAZINE (APRESOLINE) 25 MG tablet   indapamide (LOZOL) 2.5 MG tablet   lisinopril (ZESTRIL) 40 MG tablet   simvastatin (ZOCOR) 20 MG tablet   Other Relevant Orders   Comprehensive metabolic panel   Lipid Panel w/o Chol/HDL Ratio     Genitourinary   Benign hypertensive renal disease    Running high here and good at home. Continue current regimen. Continue to monitor. Refills given. Call with any concerns.       Relevant Orders   Comprehensive metabolic panel     Other   Hyperlipidemia    Under good control on current regimen. Continue current regimen. Continue to monitor. Call with any concerns. Refills given. Labs drawn today.        Relevant Medications   hydrALAZINE (APRESOLINE) 25 MG tablet   indapamide (LOZOL) 2.5 MG tablet   lisinopril (ZESTRIL) 40 MG tablet   simvastatin (ZOCOR) 20 MG tablet   Other Relevant Orders   Comprehensive metabolic panel   Lipid Panel w/o Chol/HDL Ratio   Other Visit Diagnoses     Needs flu shot       Relevant Orders   Flu Vaccine Trivalent High Dose (Fluad) (Completed)        Follow up plan: Return in about 6 months (around 07/11/2023).

## 2023-01-10 NOTE — Assessment & Plan Note (Signed)
Reassured patient. 

## 2023-01-10 NOTE — Assessment & Plan Note (Signed)
Running high here and good at home. Continue current regimen. Continue to monitor. Refills given. Call with any concerns.

## 2023-01-10 NOTE — Assessment & Plan Note (Signed)
Under good control on current regimen. Continue current regimen. Continue to monitor. Call with any concerns. Refills given. Labs drawn today.   

## 2023-01-11 LAB — LIPID PANEL W/O CHOL/HDL RATIO
Cholesterol, Total: 150 mg/dL (ref 100–199)
HDL: 45 mg/dL (ref >39)
LDL Chol Calc (NIH): 79 mg/dL (ref 0–99)
Triglycerides: 150 mg/dL — ABNORMAL HIGH (ref 0–149)
VLDL Cholesterol Cal: 26 mg/dL (ref 5–40)

## 2023-01-11 LAB — COMPREHENSIVE METABOLIC PANEL
ALT: 12 [IU]/L (ref 0–32)
AST: 16 [IU]/L (ref 0–40)
Albumin: 4.4 g/dL (ref 3.6–4.6)
Alkaline Phosphatase: 66 [IU]/L (ref 44–121)
BUN/Creatinine Ratio: 22 (ref 12–28)
BUN: 27 mg/dL (ref 10–36)
Bilirubin Total: 0.5 mg/dL (ref 0.0–1.2)
CO2: 21 mmol/L (ref 20–29)
Calcium: 9.5 mg/dL (ref 8.7–10.3)
Chloride: 104 mmol/L (ref 96–106)
Creatinine, Ser: 1.21 mg/dL — ABNORMAL HIGH (ref 0.57–1.00)
Globulin, Total: 2.1 g/dL (ref 1.5–4.5)
Glucose: 93 mg/dL (ref 70–99)
Potassium: 4.9 mmol/L (ref 3.5–5.2)
Sodium: 140 mmol/L (ref 134–144)
Total Protein: 6.5 g/dL (ref 6.0–8.5)
eGFR: 42 mL/min/{1.73_m2} — ABNORMAL LOW (ref >59)

## 2023-01-11 LAB — CBC WITH DIFFERENTIAL/PLATELET
Basophils Absolute: 0 10*3/uL (ref 0.0–0.2)
Basos: 1 %
EOS (ABSOLUTE): 0.3 10*3/uL (ref 0.0–0.4)
Eos: 4 %
Hematocrit: 36.5 % (ref 34.0–46.6)
Hemoglobin: 11.6 g/dL (ref 11.1–15.9)
Immature Grans (Abs): 0 10*3/uL (ref 0.0–0.1)
Immature Granulocytes: 1 %
Lymphocytes Absolute: 1.7 10*3/uL (ref 0.7–3.1)
Lymphs: 21 %
MCH: 30.4 pg (ref 26.6–33.0)
MCHC: 31.8 g/dL (ref 31.5–35.7)
MCV: 96 fL (ref 79–97)
Monocytes Absolute: 0.7 10*3/uL (ref 0.1–0.9)
Monocytes: 8 %
Neutrophils Absolute: 5.4 10*3/uL (ref 1.4–7.0)
Neutrophils: 65 %
Platelets: 241 10*3/uL (ref 150–450)
RBC: 3.82 x10E6/uL (ref 3.77–5.28)
RDW: 12.7 % (ref 11.7–15.4)
WBC: 8.1 10*3/uL (ref 3.4–10.8)

## 2023-01-17 ENCOUNTER — Encounter: Payer: Self-pay | Admitting: Family Medicine

## 2023-06-21 DIAGNOSIS — C4441 Basal cell carcinoma of skin of scalp and neck: Secondary | ICD-10-CM | POA: Diagnosis not present

## 2023-06-21 DIAGNOSIS — C44519 Basal cell carcinoma of skin of other part of trunk: Secondary | ICD-10-CM | POA: Diagnosis not present

## 2023-06-21 DIAGNOSIS — Z08 Encounter for follow-up examination after completed treatment for malignant neoplasm: Secondary | ICD-10-CM | POA: Diagnosis not present

## 2023-06-21 DIAGNOSIS — L570 Actinic keratosis: Secondary | ICD-10-CM | POA: Diagnosis not present

## 2023-06-21 DIAGNOSIS — D225 Melanocytic nevi of trunk: Secondary | ICD-10-CM | POA: Diagnosis not present

## 2023-06-21 DIAGNOSIS — Z85828 Personal history of other malignant neoplasm of skin: Secondary | ICD-10-CM | POA: Diagnosis not present

## 2023-06-21 DIAGNOSIS — D485 Neoplasm of uncertain behavior of skin: Secondary | ICD-10-CM | POA: Diagnosis not present

## 2023-06-21 DIAGNOSIS — L821 Other seborrheic keratosis: Secondary | ICD-10-CM | POA: Diagnosis not present

## 2023-07-12 ENCOUNTER — Ambulatory Visit (INDEPENDENT_AMBULATORY_CARE_PROVIDER_SITE_OTHER): Payer: Self-pay | Admitting: Family Medicine

## 2023-07-12 ENCOUNTER — Encounter: Payer: Self-pay | Admitting: Family Medicine

## 2023-07-12 VITALS — BP 130/66 | HR 71 | Temp 97.9°F | Ht 66.0 in | Wt 170.4 lb

## 2023-07-12 DIAGNOSIS — E538 Deficiency of other specified B group vitamins: Secondary | ICD-10-CM | POA: Diagnosis not present

## 2023-07-12 DIAGNOSIS — D692 Other nonthrombocytopenic purpura: Secondary | ICD-10-CM | POA: Diagnosis not present

## 2023-07-12 DIAGNOSIS — E782 Mixed hyperlipidemia: Secondary | ICD-10-CM | POA: Diagnosis not present

## 2023-07-12 DIAGNOSIS — I129 Hypertensive chronic kidney disease with stage 1 through stage 4 chronic kidney disease, or unspecified chronic kidney disease: Secondary | ICD-10-CM

## 2023-07-12 DIAGNOSIS — Z0000 Encounter for general adult medical examination without abnormal findings: Secondary | ICD-10-CM

## 2023-07-12 DIAGNOSIS — I700 Atherosclerosis of aorta: Secondary | ICD-10-CM

## 2023-07-12 MED ORDER — SIMVASTATIN 20 MG PO TABS: 20.0000 mg | ORAL_TABLET | Freq: Every day | ORAL | 1 refills | Status: DC

## 2023-07-12 MED ORDER — ALENDRONATE SODIUM 70 MG PO TABS: 70.0000 mg | ORAL_TABLET | ORAL | 11 refills | Status: AC

## 2023-07-12 MED ORDER — HYDRALAZINE HCL 25 MG PO TABS: ORAL_TABLET | ORAL | 1 refills | Status: DC

## 2023-07-12 MED ORDER — LISINOPRIL 40 MG PO TABS: 40.0000 mg | ORAL_TABLET | Freq: Every day | ORAL | 1 refills | Status: DC

## 2023-07-12 MED ORDER — IRON 325 (65 FE) MG PO TABS: 325.0000 mg | ORAL_TABLET | Freq: Two times a day (BID) | ORAL | 1 refills | Status: DC

## 2023-07-12 MED ORDER — INDAPAMIDE 2.5 MG PO TABS: 2.5000 mg | ORAL_TABLET | Freq: Every day | ORAL | 1 refills | Status: DC

## 2023-07-12 NOTE — Assessment & Plan Note (Signed)
 Under good control on current regimen. Continue current regimen. Continue to monitor. Call with any concerns. Refills given. Labs drawn today.

## 2023-07-12 NOTE — Assessment & Plan Note (Signed)
 Will keep BP and cholesterol under good control. Continue to monitor. Call with any concerns.

## 2023-07-12 NOTE — Progress Notes (Signed)
 BP 130/66 Comment: home  Pulse 71   Temp 97.9 F (36.6 C) (Oral)   Ht 5\' 6"  (1.676 m)   Wt 170 lb 6.4 oz (77.3 kg)   SpO2 98%   BMI 27.50 kg/m    Subjective:    Patient ID: Faith Cox, female    DOB: 1929-12-16, 89 y.o.   MRN: [id number]  HPI: Faith Cox is a 89 y.o. female presenting on 07/12/2023 for comprehensive medical examination. Current medical complaints include:  HYPERTENSION / HYPERLIPIDEMIA Satisfied with current treatment? yes Duration of hypertension: chronic BP monitoring frequency: not checking BP medication side effects: no Past BP meds: lisinopril, indapamide, hydralazine,  Duration of hyperlipidemia: chronic Cholesterol medication side effects: no Cholesterol supplements: fish oil Past cholesterol medications: simvastatin Medication compliance: excellent compliance Aspirin: no Recent stressors: no Recurrent headaches: no Visual changes: no Palpitations: no Dyspnea: no Chest pain: no Lower extremity edema: no Dizzy/lightheaded: no  Menopausal Symptoms: no  Depression Screen done today and results listed below:     07/12/2023    8:16 AM 07/17/2022   11:33 AM 07/11/2022    8:54 AM 02/23/2022   11:34 AM 02/09/2022    9:46 AM  Depression screen PHQ 2/9  Decreased Interest 0 0 0 0 0  Down, Depressed, Hopeless 0 0 0 0 0  PHQ - 2 Score 0 0 0 0 0  Altered sleeping 0 0 0 0 0  Tired, decreased energy 0 0 0 0 3  Change in appetite 0 0 0 0 2  Feeling bad or failure about yourself  0 0 0 0 0  Trouble concentrating 0 0 0 0 0  Moving slowly or fidgety/restless 0 0 0 0 0  Suicidal thoughts 0 0 0 0 0  PHQ-9 Score 0 0 0 0 5  Difficult doing work/chores    Not difficult at all     Past Medical History:  Past Medical History:  Diagnosis Date   Cancer (HCC)    skin   Hyperlipidemia    Hypertension     Surgical History:  Past Surgical History:  Procedure Laterality Date   ABDOMINAL HYSTERECTOMY     APPENDECTOMY     BASAL CELL CARCINOMA EXCISION  Right 12/24/2019   Right chin   BASAL CELL CARCINOMA EXCISION Right 01/22/2020, 01/29/20   Right post auricular   BASAL CELL CARCINOMA EXCISION  06/30/2020   BASAL CELL CARCINOMA EXCISION Right 12/2022   bladder repair     BREAST EXCISIONAL BIOPSY Left 08/09/1978   neg   GALLBLADDER SURGERY     TOE SURGERY      Medications:  Current Outpatient Medications on File Prior to Visit  Medication Sig   Calcium Carb-Cholecalciferol [phone] MG-UNIT CAPS Take by mouth.   Omega-3 Fatty Acids (FISH OIL) 1000 MG CPDR Take by mouth.   vitamin B-12 (CYANOCOBALAMIN) 1000 MCG tablet Take 1,000 mcg by mouth daily.   albuterol (VENTOLIN HFA) 108 (90 Base) MCG/ACT inhaler Inhale 2 puffs into the lungs every 6 (six) hours as needed for wheezing or shortness of breath. (Patient not taking: Reported on 07/12/2023)   aspirin 81 MG tablet Take 81 mg by mouth daily. (Patient not taking: Reported on 07/12/2023)   No current facility-administered medications on file prior to visit.    Allergies:  Allergies  Allergen Reactions   Amlodipine Swelling    Social History:  Social History   Socioeconomic History   Marital status: Married    Spouse name: Not on  file   Number of children: Not on file   Years of education: Not on file   Highest education level: Not on file  Occupational History   Occupation: retired  Tobacco Use   Smoking status: Former    Current packs/day: 0.00    Types: Cigarettes    Quit date: 04/11/1979    Years since quitting: 44.2   Smokeless tobacco: Never   Tobacco comments:    quit 30 years ago   Vaping Use   Vaping status: Never Used  Substance and Sexual Activity   Alcohol use: Yes    Alcohol/week: 0.0 standard drinks of alcohol    Comment: beer on occasion   Drug use: No   Sexual activity: Not Currently  Other Topics Concern   Not on file  Social History Narrative   Not on file   Social Drivers of Health   Financial Resource Strain: Low Risk  (07/12/2023)   Overall  Financial Resource Strain (CARDIA)    Difficulty of Paying Living Expenses: Not hard at all  Food Insecurity: No Food Insecurity (07/12/2023)   Hunger Vital Sign    Worried About Running Out of Food in the Last Year: Never true    Ran Out of Food in the Last Year: Never true  Transportation Needs: No Transportation Needs (07/12/2023)   PRAPARE - Administrator, Civil Service (Medical): No    Lack of Transportation (Non-Medical): No  Physical Activity: Insufficiently Active (07/17/2022)   Exercise Vital Sign    Days of Exercise per Week: 7 days    Minutes of Exercise per Session: 20 min  Stress: No Stress Concern Present (07/12/2023)   [employer] of Occupational Health - Occupational Stress Questionnaire    Feeling of Stress : Not at all  Social Connections: Unknown (07/12/2023)   Social Connection and Isolation Panel [NHANES]    Frequency of Communication with Friends and Family: More than three times a week    Frequency of Social Gatherings with Friends and Family: Three times a week    Attends Religious Services: Never    Active Member of Clubs or Organizations: Yes    Attends Banker Meetings: More than 4 times per year    Marital Status: Patient unable to answer  Recent Concern: Social Connections - Socially Isolated (07/12/2023)   Social Connection and Isolation Panel [NHANES]    Frequency of Communication with Friends and Family: More than three times a week    Frequency of Social Gatherings with Friends and Family: Three times a week    Attends Religious Services: Never    Active Member of Clubs or Organizations: No    Attends Banker Meetings: Never    Marital Status: Widowed  Intimate Partner Violence: Not At Risk (07/12/2023)   Humiliation, Afraid, Rape, and Kick questionnaire    Fear of Current or Ex-Partner: No    Emotionally Abused: No    Physically Abused: No    Sexually Abused: No   Social History   Tobacco Use  Smoking Status  Former   Current packs/day: 0.00   Types: Cigarettes   Quit date: 04/11/1979   Years since quitting: 44.2  Smokeless Tobacco Never  Tobacco Comments   quit 30 years ago    Social History   Substance and Sexual Activity  Alcohol Use Yes   Alcohol/week: 0.0 standard drinks of alcohol   Comment: beer on occasion    Family History:  Family History  Problem Relation  Age of Onset   Pancreatic cancer Mother    Stroke Father    Alzheimer's disease Paternal Grandmother    Leukemia Brother    Breast cancer Neg Hx     Past medical history, surgical history, medications, allergies, family history and social history reviewed with patient today and changes made to appropriate areas of the chart.   Review of Systems  Constitutional: Negative.   HENT:  Positive for hearing loss. Negative for congestion, ear discharge, ear pain, nosebleeds, sinus pain, sore throat and tinnitus.   Eyes: Negative.   Respiratory: Negative.  Negative for stridor.   Cardiovascular: Negative.   Gastrointestinal: Negative.   Genitourinary: Negative.        +leakage  Musculoskeletal: Negative.   Skin: Negative.   Neurological: Negative.   Endo/Heme/Allergies:  Positive for environmental allergies. Negative for polydipsia. Does not bruise/bleed easily.  Psychiatric/Behavioral: Negative.     All other ROS negative except what is listed above and in the HPI.      Objective:    BP 130/66 Comment: home  Pulse 71   Temp 97.9 F (36.6 C) (Oral)   Ht 5\' 6"  (1.676 m)   Wt 170 lb 6.4 oz (77.3 kg)   SpO2 98%   BMI 27.50 kg/m   Wt Readings from Last 3 Encounters:  07/12/23 170 lb 6.4 oz (77.3 kg)  01/10/23 167 lb 6.4 oz (75.9 kg)  07/17/22 164 lb (74.4 kg)    Physical Exam Vitals and nursing note reviewed.  Constitutional:      General: She is not in acute distress.    Appearance: Normal appearance. She is not ill-appearing, toxic-appearing or diaphoretic.  HENT:     Head: Normocephalic and atraumatic.      Right Ear: Tympanic membrane, ear canal and external ear normal. There is no impacted cerumen.     Left Ear: Tympanic membrane, ear canal and external ear normal. There is no impacted cerumen.     Nose: Nose normal. No congestion or rhinorrhea.     Mouth/Throat:     Mouth: Mucous membranes are moist.     Pharynx: Oropharynx is clear. No oropharyngeal exudate or posterior oropharyngeal erythema.  Eyes:     General: No scleral icterus.       Right eye: No discharge.        Left eye: No discharge.     Extraocular Movements: Extraocular movements intact.     Conjunctiva/sclera: Conjunctivae normal.     Pupils: Pupils are equal, round, and reactive to light.  Neck:     Vascular: No carotid bruit.  Cardiovascular:     Rate and Rhythm: Normal rate and regular rhythm.     Pulses: Normal pulses.     Heart sounds: No murmur heard.    No friction rub. No gallop.  Pulmonary:     Effort: Pulmonary effort is normal. No respiratory distress.     Breath sounds: Normal breath sounds. No stridor. No wheezing, rhonchi or rales.  Chest:     Chest wall: No tenderness.  Abdominal:     General: Abdomen is flat. Bowel sounds are normal. There is no distension.     Palpations: Abdomen is soft. There is no mass.     Tenderness: There is no abdominal tenderness. There is no right CVA tenderness, left CVA tenderness, guarding or rebound.     Hernia: No hernia is present.  Genitourinary:    Comments: Breast and pelvic exams deferred with shared decision making Musculoskeletal:  General: No swelling, tenderness, deformity or signs of injury.     Cervical back: Normal range of motion and neck supple. No rigidity. No muscular tenderness.     Right lower leg: No edema.     Left lower leg: No edema.  Lymphadenopathy:     Cervical: No cervical adenopathy.  Skin:    General: Skin is warm and dry.     Capillary Refill: Capillary refill takes less than 2 seconds.     Coloration: Skin is not jaundiced  or pale.     Findings: No bruising, erythema, lesion or rash.  Neurological:     General: No focal deficit present.     Mental Status: She is alert and oriented to person, place, and time. Mental status is at baseline.     Cranial Nerves: No cranial nerve deficit.     Sensory: No sensory deficit.     Motor: No weakness.     Coordination: Coordination normal.     Gait: Gait normal.     Deep Tendon Reflexes: Reflexes normal.  Psychiatric:        Mood and Affect: Mood normal.        Behavior: Behavior normal.        Thought Content: Thought content normal.        Judgment: Judgment normal.     Results for orders placed or performed in visit on 01/10/23  CBC with Differential/Platelet   Collection Time: 01/10/23  8:59 AM  Result Value Ref Range   WBC 8.1 3.4 - 10.8 x10E3/uL   RBC 3.82 3.77 - 5.28 x10E6/uL   Hemoglobin 11.6 11.1 - 15.9 g/dL   Hematocrit 16.1 09.6 - 46.6 %   MCV 96 79 - 97 fL   MCH 30.4 26.6 - 33.0 pg   MCHC 31.8 31.5 - 35.7 g/dL   RDW 04.5 40.9 - 81.1 %   Platelets 241 150 - 450 x10E3/uL   Neutrophils 65 Not Estab. %   Lymphs 21 Not Estab. %   Monocytes 8 Not Estab. %   Eos 4 Not Estab. %   Basos 1 Not Estab. %   Neutrophils Absolute 5.4 1.4 - 7.0 x10E3/uL   Lymphocytes Absolute 1.7 0.7 - 3.1 x10E3/uL   Monocytes Absolute 0.7 0.1 - 0.9 x10E3/uL   EOS (ABSOLUTE) 0.3 0.0 - 0.4 x10E3/uL   Basophils Absolute 0.0 0.0 - 0.2 x10E3/uL   Immature Granulocytes 1 Not Estab. %   Immature Grans (Abs) 0.0 0.0 - 0.1 x10E3/uL  Comprehensive metabolic panel   Collection Time: 01/10/23  8:59 AM  Result Value Ref Range   Glucose 93 70 - 99 mg/dL   BUN 27 10 - 36 mg/dL   Creatinine, Ser 9.14 (H) 0.57 - 1.00 mg/dL   eGFR 42 (L) >78 GN/FAO/1.30   BUN/Creatinine Ratio 22 12 - 28   Sodium 140 134 - 144 mmol/L   Potassium 4.9 3.5 - 5.2 mmol/L   Chloride 104 96 - 106 mmol/L   CO2 21 20 - 29 mmol/L   Calcium 9.5 8.7 - 10.3 mg/dL   Total Protein 6.5 6.0 - 8.5 g/dL   Albumin  4.4 3.6 - 4.6 g/dL   Globulin, Total 2.1 1.5 - 4.5 g/dL   Bilirubin Total 0.5 0.0 - 1.2 mg/dL   Alkaline Phosphatase 66 44 - 121 IU/L   AST 16 0 - 40 IU/L   ALT 12 0 - 32 IU/L  Lipid Panel w/o Chol/HDL Ratio   Collection Time: 01/10/23  8:59 AM  Result Value Ref Range   Cholesterol, Total 150 100 - 199 mg/dL   Triglycerides 098 (H) 0 - 149 mg/dL   HDL 45 >11 mg/dL   VLDL Cholesterol Cal 26 5 - 40 mg/dL   LDL Chol Calc (NIH) 79 0 - 99 mg/dL      Assessment & Plan:   Problem List Items Addressed This Visit       Cardiovascular and Mediastinum   Senile purpura (HCC)   Reassured patient. Continue to monitor.       Relevant Medications   hydrALAZINE (APRESOLINE) 25 MG tablet   indapamide (LOZOL) 2.5 MG tablet   lisinopril (ZESTRIL) 40 MG tablet   simvastatin (ZOCOR) 20 MG tablet   Aortic atherosclerosis (HCC)   Will keep BP and cholesterol under good control. Continue to monitor. Call with any concerns.       Relevant Medications   hydrALAZINE (APRESOLINE) 25 MG tablet   indapamide (LOZOL) 2.5 MG tablet   lisinopril (ZESTRIL) 40 MG tablet   simvastatin (ZOCOR) 20 MG tablet     Genitourinary   Benign hypertensive renal disease   Under good control on current regimen. Continue current regimen. Continue to monitor. Call with any concerns. Refills given. Labs drawn today.       Relevant Orders   CBC with Differential/Platelet   Comprehensive metabolic panel with GFR   TSH   Urine Microalbumin w/creat. ratio     Other   Hyperlipidemia   Under good control on current regimen. Continue current regimen. Continue to monitor. Call with any concerns. Refills given. Labs drawn today.        Relevant Medications   hydrALAZINE (APRESOLINE) 25 MG tablet   indapamide (LOZOL) 2.5 MG tablet   lisinopril (ZESTRIL) 40 MG tablet   simvastatin (ZOCOR) 20 MG tablet   Other Relevant Orders   CBC with Differential/Platelet   Comprehensive metabolic panel with GFR   Lipid Panel w/o  Chol/HDL Ratio   Other Visit Diagnoses       Routine general medical examination at a health care facility    -  Primary   Vaccines up to date. Screening labs checked today. DEXA up to date. Continue diet and exercise. Call with any concerns.     B12 deficiency       Rechecking labs today. Await results. Treat as needed.   Relevant Orders   B12        Follow up plan: Return in about 6 months (around 01/11/2024).   LABORATORY TESTING:  - Pap smear: not applicable  IMMUNIZATIONS:   - Tdap: Tetanus vaccination status reviewed: last tetanus booster within 10 years. - Influenza: Up to date - Pneumovax: Up to date - Prevnar: Up to date - COVID: Refused - HPV: Not applicable - Shingrix vaccine: Refused  SCREENING: -Mammogram: Not applicable  - Colonoscopy: Not applicable  - Bone Density: Up to date   PATIENT COUNSELING:   Advised to take 1 mg of folate supplement per day if capable of pregnancy.   Sexuality: Discussed sexually transmitted diseases, partner selection, use of condoms, avoidance of unintended pregnancy  and contraceptive alternatives.   Advised to avoid cigarette smoking.  I discussed with the patient that most people either abstain from alcohol or drink within safe limits (<=14/week and <=4 drinks/occasion for males, <=7/weeks and <= 3 drinks/occasion for females) and that the risk for alcohol disorders and other health effects rises proportionally with the number of drinks per week and how  often a drinker exceeds daily limits.  Discussed cessation/primary prevention of drug use and availability of treatment for abuse.   Diet: Encouraged to adjust caloric intake to maintain  or achieve ideal body weight, to reduce intake of dietary saturated fat and total fat, to limit sodium intake by avoiding high sodium foods and not adding table salt, and to maintain adequate dietary potassium and calcium preferably from fresh fruits, vegetables, and low-fat dairy products.     stressed the importance of regular exercise  Injury prevention: Discussed safety belts, safety helmets, smoke detector, smoking near bedding or upholstery.   Dental health: Discussed importance of regular tooth brushing, flossing, and dental visits.    NEXT PREVENTATIVE PHYSICAL DUE IN 1 YEAR. Return in about 6 months (around 01/11/2024).

## 2023-07-12 NOTE — Assessment & Plan Note (Signed)
 Reassured patient. Continue to monitor.

## 2023-07-13 ENCOUNTER — Encounter: Payer: Self-pay | Admitting: Family Medicine

## 2023-07-13 LAB — COMPREHENSIVE METABOLIC PANEL WITH GFR
ALT: 11 IU/L (ref 0–32)
AST: 14 IU/L (ref 0–40)
Albumin: 4.4 g/dL (ref 3.6–4.6)
Alkaline Phosphatase: 68 IU/L (ref 44–121)
BUN/Creatinine Ratio: 28 (ref 12–28)
BUN: 37 mg/dL — ABNORMAL HIGH (ref 10–36)
Bilirubin Total: 0.5 mg/dL (ref 0.0–1.2)
CO2: 21 mmol/L (ref 20–29)
Calcium: 9.6 mg/dL (ref 8.7–10.3)
Chloride: 102 mmol/L (ref 96–106)
Creatinine, Ser: 1.33 mg/dL — ABNORMAL HIGH (ref 0.57–1.00)
Globulin, Total: 2.3 g/dL (ref 1.5–4.5)
Glucose: 101 mg/dL — ABNORMAL HIGH (ref 70–99)
Potassium: 4.4 mmol/L (ref 3.5–5.2)
Sodium: 138 mmol/L (ref 134–144)
Total Protein: 6.7 g/dL (ref 6.0–8.5)
eGFR: 38 mL/min/{1.73_m2} — ABNORMAL LOW (ref >59)

## 2023-07-13 LAB — LIPID PANEL W/O CHOL/HDL RATIO
Cholesterol, Total: 162 mg/dL (ref 100–199)
HDL: 46 mg/dL (ref >39)
LDL Chol Calc (NIH): 86 mg/dL (ref 0–99)
Triglycerides: 173 mg/dL — ABNORMAL HIGH (ref 0–149)
VLDL Cholesterol Cal: 30 mg/dL (ref 5–40)

## 2023-07-13 LAB — CBC WITH DIFFERENTIAL/PLATELET
Basophils Absolute: 0.1 10*3/uL (ref 0.0–0.2)
Basos: 1 %
EOS (ABSOLUTE): 0.4 10*3/uL (ref 0.0–0.4)
Eos: 5 %
Hematocrit: 35.4 % (ref 34.0–46.6)
Hemoglobin: 11.5 g/dL (ref 11.1–15.9)
Immature Grans (Abs): 0 10*3/uL (ref 0.0–0.1)
Immature Granulocytes: 0 %
Lymphocytes Absolute: 1.7 10*3/uL (ref 0.7–3.1)
Lymphs: 20 %
MCH: 30.8 pg (ref 26.6–33.0)
MCHC: 32.5 g/dL (ref 31.5–35.7)
MCV: 95 fL (ref 79–97)
Monocytes Absolute: 0.6 10*3/uL (ref 0.1–0.9)
Monocytes: 7 %
Neutrophils Absolute: 5.8 10*3/uL (ref 1.4–7.0)
Neutrophils: 67 %
Platelets: 231 10*3/uL (ref 150–450)
RBC: 3.73 x10E6/uL — ABNORMAL LOW (ref 3.77–5.28)
RDW: 12.5 % (ref 11.7–15.4)
WBC: 8.6 10*3/uL (ref 3.4–10.8)

## 2023-07-13 LAB — TSH: TSH: 2.01 u[IU]/mL (ref 0.450–4.500)

## 2023-07-13 LAB — MICROALBUMIN / CREATININE URINE RATIO
Creatinine, Urine: 90.9 mg/dL
Microalb/Creat Ratio: 16 mg/g{creat} (ref 0–29)
Microalbumin, Urine: 14.7 ug/mL

## 2023-07-13 LAB — VITAMIN B12: Vitamin B-12: 1166 pg/mL (ref 232–1245)

## 2023-07-16 NOTE — Progress Notes (Signed)
Result printed and mailed.

## 2023-07-16 NOTE — Progress Notes (Signed)
 Letter printed and mailed.

## 2023-07-23 ENCOUNTER — Telehealth: Payer: Self-pay | Admitting: Family Medicine

## 2023-07-23 NOTE — Telephone Encounter (Signed)
 Copied from CRM [id number]. Topic: Medicare AWV >> Jul 23, 2023 10:40 AM Juliana Ocean wrote: Reason for CRM: LVM 07/23/23 reminder call for AWV on 07/24/2023 at 3:10 pm khc  Rosalee Collins; Care Guide Ambulatory Clinical Support [place] [facility] Direct Dial: [phone]

## 2023-07-24 ENCOUNTER — Ambulatory Visit: Payer: Self-pay

## 2023-07-24 VITALS — Ht 66.0 in | Wt 170.0 lb

## 2023-07-24 DIAGNOSIS — Z0000 Encounter for general adult medical examination without abnormal findings: Secondary | ICD-10-CM | POA: Diagnosis not present

## 2023-07-24 DIAGNOSIS — Z1231 Encounter for screening mammogram for malignant neoplasm of breast: Secondary | ICD-10-CM

## 2023-07-24 NOTE — Patient Instructions (Addendum)
 Ms. Suares , Thank you for taking time to come for your Medicare Wellness Visit. I appreciate your ongoing commitment to your health goals. Please review the following plan we discussed and let me know if I can assist you in the future.   Referrals/Orders/Follow-Ups/Clinician Recommendations: I have placed an order for a mammogram (due 11/03/23). Call [facility] @ [phone] to schedule at your convenience.  This is a list of the screening recommended for you and due dates:  Health Maintenance  Topic Date Due   Zoster (Shingles) Vaccine (1 of 2) 10/11/2023*   Mammogram  11/03/2023   Flu Shot  11/09/2023   Medicare Annual Wellness Visit  07/23/2024   DEXA scan (bone density measurement)  08/28/2025   DTaP/Tdap/Td vaccine (2 - Tdap) 06/06/2026   Pneumonia Vaccine  Completed   HPV Vaccine  Aged Out   Meningitis B Vaccine  Aged Out   COVID-19 Vaccine  Discontinued  *Topic was postponed. The date shown is not the original due date.    Advanced directives: (Copy Requested) Please bring a copy of your health care power of attorney and living will to the office to be added to your chart at your convenience. You can mail to [facility] [address] [place], [place] [address] or email to [email or web address]  Next Medicare Annual Wellness Visit scheduled for next year: Yes, 07/29/24 @ 3:10pm (phone visit)

## 2023-07-24 NOTE — Progress Notes (Signed)
 Subjective:   Faith Cox is a 89 y.o. who presents for a Medicare Wellness preventive visit.  Visit Complete: Virtual I connected with  [name] on 07/24/23 by a audio enabled telemedicine application and verified that I am speaking with the correct person using two identifiers.  Patient Location: Home  Provider Location: Office/Clinic  I discussed the limitations of evaluation and management by telemedicine. The patient expressed understanding and agreed to proceed.  Vital Signs: Because this visit was a virtual/telehealth visit, some criteria may be missing or patient reported. Any vitals not documented were not able to be obtained and vitals that have been documented are patient reported.  VideoDeclined- This patient declined Librarian, academic. Therefore the visit was completed with audio only.  Persons Participating in Visit: Patient.  AWV Questionnaire: No: Patient Medicare AWV questionnaire was not completed prior to this visit.  Cardiac Risk Factors include: advanced age (>24men, >68 women);hypertension;dyslipidemia     Objective:    Today's Vitals   07/24/23 1504  Weight: 170 lb (77.1 kg)  Height: 5\' 6"  (1.676 m)   Body mass index is 27.44 kg/m.     07/24/2023    3:17 PM 07/17/2022   11:34 AM 06/30/2021    9:03 AM 06/28/2020    9:00 AM 04/22/2020    4:07 PM 06/06/2016    8:54 AM 06/01/2015    8:33 AM  Advanced Directives  Does Patient Have a Medical Advance Directive? Yes No Yes Yes No No No  Type of Estate agent of [place];Living will  Healthcare Power of Attorney Living will     Does patient want to make changes to medical advance directive? No - Patient declined  No - Patient declined      Copy of Healthcare Power of Attorney in Chart? No - copy requested  No - copy requested      Would patient like information on creating a medical advance directive?  No - Patient declined    Yes  (MAU/Ambulatory/Procedural Areas - Information given) Yes - Educational materials given    Current Medications (verified) Outpatient Encounter Medications as of 07/24/2023  Medication Sig   alendronate (FOSAMAX) 70 MG tablet Take 1 tablet (70 mg total) by mouth every 7 (seven) days. Take with a full glass of water on an empty stomach.   Calcium Carb-Cholecalciferol [phone] MG-UNIT CAPS Take by mouth.   Ferrous Sulfate (IRON) 325 (65 Fe) MG TABS Take 1 tablet (325 mg total) by mouth 2 (two) times daily.   hydrALAZINE (APRESOLINE) 25 MG tablet TAKE 1 TABLET BY MOUTH IN THE MORNING AND AT BEDTIME   indapamide (LOZOL) 2.5 MG tablet Take 1 tablet (2.5 mg total) by mouth daily.   lisinopril (ZESTRIL) 40 MG tablet Take 1 tablet (40 mg total) by mouth daily.   Omega-3 Fatty Acids (FISH OIL) 1000 MG CPDR Take by mouth.   simvastatin (ZOCOR) 20 MG tablet Take 1 tablet (20 mg total) by mouth at bedtime.   vitamin B-12 (CYANOCOBALAMIN) 1000 MCG tablet Take 1,000 mcg by mouth daily.   albuterol (VENTOLIN HFA) 108 (90 Base) MCG/ACT inhaler Inhale 2 puffs into the lungs every 6 (six) hours as needed for wheezing or shortness of breath. (Patient not taking: Reported on 07/17/2022)   aspirin 81 MG tablet Take 81 mg by mouth daily. (Patient not taking: Reported on 07/24/2023)   No facility-administered encounter medications on file as of 07/24/2023.    Allergies (verified) Amlodipine  History: Past Medical History:  Diagnosis Date   Cancer (HCC)    skin   Hyperlipidemia    Hypertension    Past Surgical History:  Procedure Laterality Date   ABDOMINAL HYSTERECTOMY     APPENDECTOMY     BASAL CELL CARCINOMA EXCISION Right 12/24/2019   Right chin   BASAL CELL CARCINOMA EXCISION Right 01/22/2020, 01/29/20   Right post auricular   BASAL CELL CARCINOMA EXCISION  06/30/2020   BASAL CELL CARCINOMA EXCISION Right 12/2022   bladder repair     BREAST EXCISIONAL BIOPSY Left 08/09/1978   neg   GALLBLADDER  SURGERY     TOE SURGERY     Family History  Problem Relation Age of Onset   Pancreatic cancer Mother    Stroke Father    Alzheimer's disease Paternal Grandmother    Leukemia Brother    Breast cancer Neg Hx    Social History   Socioeconomic History   Marital status: Widowed    Spouse name: Not on file   Number of children: 2   Years of education: Not on file   Highest education level: Not on file  Occupational History   Occupation: retired  Tobacco Use   Smoking status: Former    Current packs/day: 0.00    Average packs/day: 0.3 packs/day for 31.0 years (9.3 ttl pk-yrs)    Types: Cigarettes    Start date: 25    Quit date: 04/11/1979    Years since quitting: 44.3   Smokeless tobacco: Never   Tobacco comments:    quit 30 years ago   Vaping Use   Vaping status: Never Used  Substance and Sexual Activity   Alcohol use: Yes    Comment: beer on occasion monthly or less   Drug use: No   Sexual activity: Not Currently  Other Topics Concern   Not on file  Social History Narrative   Not on file   Social Drivers of Health   Financial Resource Strain: Low Risk  (07/24/2023)   Overall Financial Resource Strain (CARDIA)    Difficulty of Paying Living Expenses: Not hard at all  Food Insecurity: No Food Insecurity (07/24/2023)   Hunger Vital Sign    Worried About Running Out of Food in the Last Year: Never true    Ran Out of Food in the Last Year: Never true  Transportation Needs: No Transportation Needs (07/24/2023)   PRAPARE - Administrator, Civil Service (Medical): No    Lack of Transportation (Non-Medical): No  Physical Activity: Insufficiently Active (07/24/2023)   Exercise Vital Sign    Days of Exercise per Week: 7 days    Minutes of Exercise per Session: 20 min  Stress: No Stress Concern Present (07/24/2023)   [employer] of Occupational Health - Occupational Stress Questionnaire    Feeling of Stress : Not at all  Social Connections: Socially Isolated  (07/24/2023)   Social Connection and Isolation Panel [NHANES]    Frequency of Communication with Friends and Family: More than three times a week    Frequency of Social Gatherings with Friends and Family: More than three times a week    Attends Religious Services: Never    Database administrator or Organizations: No    Attends Banker Meetings: Never    Marital Status: Widowed    Tobacco Counseling Counseling given: Not Answered Tobacco comments: quit 30 years ago     Clinical Intake:  Pre-visit preparation completed: Yes  Pain :  No/denies pain     BMI - recorded: 27.44 Nutritional Status: BMI 25 -29 Overweight Nutritional Risks: None Diabetes: No  No results found for: "HGBA1C"   How often do you need to have someone help you when you read instructions, pamphlets, or other written materials from your doctor or pharmacy?: 1 - Never  Interpreter Needed?: No  Information entered by :: Tora Kindred, CMA   Activities of Daily Living     07/24/2023    3:06 PM  In your present state of health, do you have any difficulty performing the following activities:  Hearing? 1  Comment wears hearing aids  Vision? 0  Difficulty concentrating or making decisions? 0  Walking or climbing stairs? 0  Dressing or bathing? 0  Doing errands, shopping? 0  Preparing Food and eating ? N  Using the Toilet? N  In the past six months, have you accidently leaked urine? Y  Comment wears pad  Do you have problems with loss of bowel control? N  Managing your Medications? N  Managing your Finances? N  Housekeeping or managing your Housekeeping? N    Patient Care Team: Dorcas Carrow, DO as PCP - General (Family Medicine) Dasher, Cliffton Asters, MD (Dermatology)  Indicate any recent Medical Services you may have received from other than [facility] providers in the past year (date may be approximate).     Assessment:   This is a routine wellness examination for Satoya.  Hearing/Vision  screen Hearing Screening - Comments:: Wears hearing aids Vision Screening - Comments:: Gets routine eye exams, [facility] [address] [place]   Goals Addressed             This Visit's Progress    Patient Stated       Be more social       Depression Screen     07/24/2023    3:15 PM 07/12/2023    8:16 AM 07/17/2022   11:33 AM 07/11/2022    8:54 AM 02/23/2022   11:34 AM 02/09/2022    9:46 AM 01/30/2022    2:58 PM  PHQ 2/9 Scores  PHQ - 2 Score 0 0 0 0 0 0 0  PHQ- 9 Score 1 0 0 0 0 5 0    Fall Risk     07/24/2023    3:18 PM 07/17/2022   11:35 AM 07/11/2022    8:53 AM 02/23/2022   11:33 AM 01/30/2022    2:58 PM  Fall Risk   Falls in the past year? 0 0 0 0 0  Number falls in past yr: 0 0 0 0 0  Injury with Fall? 0 0 0 0 0  Risk for fall due to : No Fall Risks No Fall Risks No Fall Risks No Fall Risks No Fall Risks  Follow up Falls prevention discussed;Falls evaluation completed Falls prevention discussed;Falls evaluation completed Falls evaluation completed Falls evaluation completed Falls evaluation completed    MEDICARE RISK AT HOME:  Medicare Risk at Home Any stairs in or around the home?: Yes If so, are there any without handrails?: No Home free of loose throw rugs in walkways, pet beds, electrical cords, etc?: Yes Adequate lighting in your home to reduce risk of falls?: Yes Life alert?: No Use of a cane, walker or w/c?: No Grab bars in the bathroom?: Yes Shower chair or bench in shower?: No Elevated toilet seat or a handicapped toilet?: No  TIMED UP AND GO:  Was the test performed?  No  Cognitive Function: 6CIT completed        07/24/2023    3:19 PM 07/17/2022   11:39 AM 07/11/2022    9:19 AM 06/28/2020    9:03 AM 06/23/2019    9:43 AM  6CIT Screen  What Year? 0 points 0 points 0 points 0 points 0 points  What month? 0 points 0 points 0 points 0 points 0 points  What time? 0 points 0 points 0 points 0 points 0 points  Count back from 20 2 points 0  points 0 points 2 points 0 points  Months in reverse 2 points 0 points 0 points 0 points 0 points  Repeat phrase 4 points 0 points 2 points 6 points 0 points  Total Score 8 points 0 points 2 points 8 points 0 points    Immunizations Immunization History  Administered Date(s) Administered   Fluad Quad(high Dose 65+) 12/19/2018, 12/26/2019, 01/07/2021, 01/09/2022   Fluad Trivalent(High Dose 65+) 01/10/2023   Influenza, High Dose Seasonal PF 12/08/2015, 12/18/2017   Influenza-Unspecified 12/10/2014, 12/09/2016   PFIZER Comirnaty(Gray Top)Covid-19 Tri-Sucrose Vaccine 08/04/2020   PFIZER(Purple Top)SARS-COV-2 Vaccination 05/05/2019, 05/26/2019, 01/12/2020   Pneumococcal Conjugate-13 11/09/2014   Pneumococcal Polysaccharide-23 12/08/2015   Td 06/06/2016    Screening Tests Health Maintenance  Topic Date Due   Zoster Vaccines- Shingrix (1 of 2) 10/11/2023 (Originally 10/23/1948)   MAMMOGRAM  11/03/2023   INFLUENZA VACCINE  11/09/2023   Medicare Annual Wellness (AWV)  07/23/2024   DEXA SCAN  08/28/2025   DTaP/Tdap/Td (2 - Tdap) 06/06/2026   Pneumonia Vaccine 18+ Years old  Completed   HPV VACCINES  Aged Out   Meningococcal B Vaccine  Aged Out   COVID-19 Vaccine  Discontinued    Health Maintenance  There are no preventive care reminders to display for this patient.  Health Maintenance Items Addressed: Mammogram ordered, See Nurse Notes  Additional Screening:  Vision Screening: Recommended annual ophthalmology exams for early detection of glaucoma and other disorders of the eye.  Dental Screening: Recommended annual dental exams for proper oral hygiene  Community Resource Referral / Chronic Care Management: CRR required this visit?  No   CCM required this visit?  No     Plan:     I have personally reviewed and noted the following in the patient's chart:   Medical and social history Use of alcohol, tobacco or illicit drugs  Current medications and supplements  including opioid prescriptions. Patient is not currently taking opioid prescriptions. Functional ability and status Nutritional status Physical activity Advanced directives List of other physicians Hospitalizations, surgeries, and ER visits in previous 12 months Vitals Screenings to include cognitive, depression, and falls Referrals and appointments  In addition, I have reviewed and discussed with patient certain preventive protocols, quality metrics, and best practice recommendations. A written personalized care plan for preventive services as well as general preventive health recommendations were provided to patient.     Jaunita Messier, CMA   07/24/2023   After Visit Summary: (Mail) Due to this being a telephonic visit, the after visit summary with patients personalized plan was offered to patient via mail   Notes:  6 CIT Score - 8 Placed order for a MMG (due ~11/03/23) Declined covid and shingles vaccines

## 2023-08-14 DIAGNOSIS — C4441 Basal cell carcinoma of skin of scalp and neck: Secondary | ICD-10-CM | POA: Diagnosis not present

## 2023-08-28 DIAGNOSIS — C44519 Basal cell carcinoma of skin of other part of trunk: Secondary | ICD-10-CM | POA: Diagnosis not present

## 2023-11-14 ENCOUNTER — Ambulatory Visit
Admission: RE | Admit: 2023-11-14 | Discharge: 2023-11-14 | Disposition: A | Discharge status: Home / Self Care | Source: Ambulatory Visit | Attending: Family Medicine | Admitting: Family Medicine

## 2023-11-14 DIAGNOSIS — Z1231 Encounter for screening mammogram for malignant neoplasm of breast: Secondary | ICD-10-CM | POA: Diagnosis not present

## 2023-11-16 ENCOUNTER — Ambulatory Visit: Payer: Self-pay | Admitting: Family Medicine

## 2023-12-07 ENCOUNTER — Ambulatory Visit (INDEPENDENT_AMBULATORY_CARE_PROVIDER_SITE_OTHER)

## 2023-12-07 ENCOUNTER — Ambulatory Visit: Payer: Self-pay

## 2023-12-07 VITALS — BP 164/70 | HR 93 | Ht 66.0 in | Wt 173.0 lb

## 2023-12-07 DIAGNOSIS — M5431 Sciatica, right side: Secondary | ICD-10-CM

## 2023-12-07 DIAGNOSIS — I700 Atherosclerosis of aorta: Secondary | ICD-10-CM | POA: Diagnosis not present

## 2023-12-07 DIAGNOSIS — M5430 Sciatica, unspecified side: Secondary | ICD-10-CM | POA: Insufficient documentation

## 2023-12-07 NOTE — Progress Notes (Signed)
 Acute Patient Visit  Physician: Faith Thivierge A Leah Skora, MD  Patient: Faith Cox MRN: [id number] DOB: 1930/02/11 PCP: Faith Duwaine SQUIBB, DO     Subjective:   Chief Complaint  Patient presents with   Acute Visit    Right hip pain started Wednesday. Been walking with a cane since then, pain is a 6 on 0-10 pain scale. It worsens with standing.     HPI: The patient is a 89 y.o. female who presents today for:   Discussed the use of AI scribe software for clinical note transcription with the patient, who gave verbal consent to proceed.  History of Present Illness   Faith Cox is a 89 year old female who presents with sudden onset right hip pain.  Right buttock - Sudden onset of right hip pain on Wednesday around lunchtime while attempting to stand - Pain localized to the right mid buttock, non radiating - Pain severity rated as 6 out of 10 - Pain worsens with weight-bearing activities - No pain while sitting or riding stationary bike, which she uses daily - No prior history of hip pain - No recent trauma - No associated chills or back pain  - Note Tcore -4.2, on alendronate   Mobility and functional status - Uses a cane for mobility due to fear of falling  Pain management - Manages pain with over-the-counter Tylenol  - No new medications started          ROS:   As noted in the HPI    ASSESMENT/PLAN:  Encounter Diagnoses  Name Primary?   Aortic atherosclerosis (HCC) Yes   Sciatica of right side     No orders of the defined types were placed in this encounter.   Assessment and Plan    Right-sided sciatica Acute right-sided sciatica with pain localized to the sciatic nerve exit, exacerbated by weight-bearing activities. Differential diagnosis includes primary hip joint issues, but examination suggests no primary hip joint pathology. Gabapentin is considered but not preferred due to potential side effects in her age group. - Provide prescription for  physical therapy to assist with insurance coverage. - Recommend physical therapy focusing on sciatica management. - Advise use of acetaminophen  for pain management. - Suggest ibuprofen if it does not cause gastrointestinal irritation. - Demonstrate and recommend specific exercises and stretches that can be done at home, avoiding those requiring floor positions. - Advise to contact a physical therapist for an appointment. - Discuss potential for further interventions, such as injections, if symptoms do not improve.    OBJECTIVE: Vitals:   12/07/23 1352  BP: (!) 164/70  Pulse: 93  SpO2: 96%  Weight: 173 lb (78.5 kg)  Height: 5' 6 (1.676 m)    Body mass index is 27.92 kg/m.   Physical Exam Vitals reviewed.  Constitutional:      Appearance: Normal appearance. Well-developed with normal weight.  Cardiovascular:     Rate and Rhythm: Normal rate and regular rhythm. Normal heart sounds. Normal peripheral pulses Pulmonary:     Normal breath sounds with normal effort Skin:    General: Skin is warm and dry without noticeable rash. Neurological:     General: No focal deficit present.  Psychiatric:        Mood and Affect: Mood, behavior and cognition normal  Msk:  Tenderness over mid buttock over sciatic nerve       Allergies Patient is allergic to amlodipine .  Past Medical History Patient  has a past medical history of Cancer (HCC),  Hyperlipidemia, and Hypertension.  Surgical History Patient  has a past surgical history that includes Abdominal hysterectomy; Gallbladder surgery; bladder repair; Toe Surgery; Appendectomy; Breast excisional biopsy (Left, 08/09/1978); Excision basal cell carcinoma (Right, 12/24/2019); Excision basal cell carcinoma (Right, 01/22/2020, 01/29/20); Excision basal cell carcinoma (06/30/2020); and Excision basal cell carcinoma (Right, 12/2022).  Family History Pateint's family history includes Alzheimer's disease in her paternal grandmother; Leukemia in  her brother; Pancreatic cancer in her mother; Stroke in her father.  Social History Patient  reports that she quit smoking about 44 years ago. Her smoking use included cigarettes. She started smoking about 75 years ago. She has a 9.3 pack-year smoking history. She has never used smokeless tobacco. She reports current alcohol use. She reports that she does not use drugs.    12/07/2023

## 2023-12-07 NOTE — Telephone Encounter (Signed)
 FYI Only or Action Required?: FYI only for provider.  Patient was last seen in primary care on 07/12/2023 by Vicci Duwaine SQUIBB, DO.  Called Nurse Triage reporting Hip Pain.  Symptoms began Wednesday.  Interventions attempted: Rest, hydration, or home remedies.  Symptoms are: unchanged.  Triage Disposition: See PCP When Office is Open (Within 3 Days)  Patient/caregiver understands and will follow disposition?:  No appts within timeframe. Pt wanting to be seen asap. Appt made for pt at [facility] with [facility]. Pt stated she knows where practice is locaqted. Address provided.      Copied from CRM [id number]. Topic: Clinical - Red Word Triage >> Dec 07, 2023 11:28 AM Nathanel BROCKS wrote: Red Word that prompted transfer to Nurse Triage: Pt is having pain in her hips and legs and can't walk. Right hip. Reason for Disposition  [1] MODERATE pain (e.g., interferes with normal activities, limping) AND [2] present > 3 days  Answer Assessment - Initial Assessment Questions 1. LOCATION and RADIATION: Where is the pain located? Does the pain spread (shoot) anywhere else?     Right hip  2. QUALITY: What does the pain feel like?  (e.g., sharp, dull, aching, burning)     Shooing pain cannot explain how it feels  3. SEVERITY: How bad is the pain? What does it keep you from doing?   (Scale 1-10; or mild, moderate, severe)     6/10  4. ONSET: When did the pain start? Does it come and go, or is it there all the time?     Wednesday  5. WORK OR EXERCISE: Has there been any recent work or exercise that involved this part of the body?      no 6. CAUSE: What do you think is causing the hip pain?      Unsure  7. AGGRAVATING FACTORS: What makes the hip pain worse? (e.g., walking, climbing stairs, running)     walking 8. OTHER SYMPTOMS: Do you have any other symptoms? (e.g., back pain, pain shooting down leg,  fever, rash)     Shooting from hip  Protocols used: Hip Pain-A-AH

## 2023-12-20 DIAGNOSIS — M545 Low back pain, unspecified: Secondary | ICD-10-CM | POA: Diagnosis not present

## 2023-12-24 DIAGNOSIS — M545 Low back pain, unspecified: Secondary | ICD-10-CM | POA: Diagnosis not present

## 2023-12-27 DIAGNOSIS — M545 Low back pain, unspecified: Secondary | ICD-10-CM | POA: Diagnosis not present

## 2023-12-31 DIAGNOSIS — M545 Low back pain, unspecified: Secondary | ICD-10-CM | POA: Diagnosis not present

## 2024-01-03 DIAGNOSIS — C44319 Basal cell carcinoma of skin of other parts of face: Secondary | ICD-10-CM | POA: Diagnosis not present

## 2024-01-03 DIAGNOSIS — D0472 Carcinoma in situ of skin of left lower limb, including hip: Secondary | ICD-10-CM | POA: Diagnosis not present

## 2024-01-03 DIAGNOSIS — D225 Melanocytic nevi of trunk: Secondary | ICD-10-CM | POA: Diagnosis not present

## 2024-01-03 DIAGNOSIS — Z08 Encounter for follow-up examination after completed treatment for malignant neoplasm: Secondary | ICD-10-CM | POA: Diagnosis not present

## 2024-01-03 DIAGNOSIS — Z85828 Personal history of other malignant neoplasm of skin: Secondary | ICD-10-CM | POA: Diagnosis not present

## 2024-01-03 DIAGNOSIS — D485 Neoplasm of uncertain behavior of skin: Secondary | ICD-10-CM | POA: Diagnosis not present

## 2024-01-03 DIAGNOSIS — L821 Other seborrheic keratosis: Secondary | ICD-10-CM | POA: Diagnosis not present

## 2024-01-03 DIAGNOSIS — C4441 Basal cell carcinoma of skin of scalp and neck: Secondary | ICD-10-CM | POA: Diagnosis not present

## 2024-01-03 DIAGNOSIS — L570 Actinic keratosis: Secondary | ICD-10-CM | POA: Diagnosis not present

## 2024-01-03 DIAGNOSIS — C4401 Basal cell carcinoma of skin of lip: Secondary | ICD-10-CM | POA: Diagnosis not present

## 2024-01-04 DIAGNOSIS — M545 Low back pain, unspecified: Secondary | ICD-10-CM | POA: Diagnosis not present

## 2024-01-14 ENCOUNTER — Ambulatory Visit (INDEPENDENT_AMBULATORY_CARE_PROVIDER_SITE_OTHER): Admitting: Family Medicine

## 2024-01-14 ENCOUNTER — Encounter: Payer: Self-pay | Admitting: Family Medicine

## 2024-01-14 VITALS — BP 142/60 | HR 67 | Temp 97.9°F | Ht 66.0 in | Wt 168.0 lb

## 2024-01-14 DIAGNOSIS — Z23 Encounter for immunization: Secondary | ICD-10-CM

## 2024-01-14 DIAGNOSIS — I129 Hypertensive chronic kidney disease with stage 1 through stage 4 chronic kidney disease, or unspecified chronic kidney disease: Secondary | ICD-10-CM | POA: Diagnosis not present

## 2024-01-14 DIAGNOSIS — E782 Mixed hyperlipidemia: Secondary | ICD-10-CM

## 2024-01-14 MED ORDER — LISINOPRIL 40 MG PO TABS: 40.0000 mg | ORAL_TABLET | Freq: Every day | ORAL | 1 refills | Status: AC

## 2024-01-14 MED ORDER — INDAPAMIDE 2.5 MG PO TABS: 2.5000 mg | ORAL_TABLET | Freq: Every day | ORAL | 1 refills | Status: AC

## 2024-01-14 MED ORDER — IRON 325 (65 FE) MG PO TABS: 325.0000 mg | ORAL_TABLET | Freq: Two times a day (BID) | ORAL | 1 refills | Status: AC

## 2024-01-14 MED ORDER — HYDRALAZINE HCL 25 MG PO TABS: ORAL_TABLET | ORAL | 1 refills | Status: AC

## 2024-01-14 MED ORDER — SIMVASTATIN 20 MG PO TABS: 20.0000 mg | ORAL_TABLET | Freq: Every day | ORAL | 1 refills | Status: AC

## 2024-01-14 NOTE — Progress Notes (Signed)
 BP (!) 142/60   Pulse 67   Temp 97.9 F (36.6 C) (Oral)   Ht 5' 6 (1.676 m)   Wt 168 lb (76.2 kg)   SpO2 94%   BMI 27.12 kg/m    Subjective:    Patient ID: Faith Cox, female    DOB: [birth year], 89 y.o.   MRN: [id number]  HPI: Faith Cox is a 89 y.o. female  Chief Complaint  Patient presents with   Hypertension   Hyperlipidemia   HYPERTENSION / HYPERLIPIDEMIA Satisfied with current treatment? yes Duration of hypertension: chronic BP monitoring frequency: not checking BP range: 110s-120s/60s-70s BP medication side effects: no Past BP meds: lisinopril , hydralazine , indapamide  Duration of hyperlipidemia: chronic Cholesterol medication side effects: no Cholesterol supplements: none Past cholesterol medications: simvastatin  Medication compliance: excellent compliance Aspirin: no Recent stressors: no Recurrent headaches: no Visual changes: no Palpitations: no Dyspnea: no Chest pain: no Lower extremity edema: no Dizzy/lightheaded: no   Relevant past medical, surgical, family and social history reviewed and updated as indicated. Interim medical history since our last visit reviewed. Allergies and medications reviewed and updated.  Review of Systems  Constitutional: Negative.   Respiratory: Negative.    Cardiovascular: Negative.   Gastrointestinal: Negative.   Musculoskeletal: Negative.   Skin: Negative.   Psychiatric/Behavioral: Negative.      Per HPI unless specifically indicated above     Objective:    BP (!) 142/60   Pulse 67   Temp 97.9 F (36.6 C) (Oral)   Ht 5' 6 (1.676 m)   Wt 168 lb (76.2 kg)   SpO2 94%   BMI 27.12 kg/m   Wt Readings from Last 3 Encounters:  01/14/24 168 lb (76.2 kg)  12/07/23 173 lb (78.5 kg)  07/24/23 170 lb (77.1 kg)    Physical Exam Vitals and nursing note reviewed.  Constitutional:      General: She is not in acute distress.    Appearance: Normal appearance. She is normal weight. She is not ill-appearing,  toxic-appearing or diaphoretic.  HENT:     Head: Normocephalic and atraumatic.     Right Ear: External ear normal.     Left Ear: External ear normal.     Nose: Nose normal.     Mouth/Throat:     Mouth: Mucous membranes are moist.     Pharynx: Oropharynx is clear.  Eyes:     General: No scleral icterus.       Right eye: No discharge.        Left eye: No discharge.     Extraocular Movements: Extraocular movements intact.     Conjunctiva/sclera: Conjunctivae normal.     Pupils: Pupils are equal, round, and reactive to light.  Cardiovascular:     Rate and Rhythm: Normal rate and regular rhythm.     Pulses: Normal pulses.     Heart sounds: Normal heart sounds. No murmur heard.    No friction rub. No gallop.  Pulmonary:     Effort: Pulmonary effort is normal. No respiratory distress.     Breath sounds: Normal breath sounds. No stridor. No wheezing, rhonchi or rales.  Chest:     Chest wall: No tenderness.  Musculoskeletal:        General: Normal range of motion.     Cervical back: Normal range of motion and neck supple.  Skin:    General: Skin is warm and dry.     Capillary Refill: Capillary refill takes less than 2 seconds.  Coloration: Skin is not jaundiced or pale.     Findings: No bruising, erythema, lesion or rash.  Neurological:     General: No focal deficit present.     Mental Status: She is alert and oriented to person, place, and time. Mental status is at baseline.  Psychiatric:        Mood and Affect: Mood normal.        Behavior: Behavior normal.        Thought Content: Thought content normal.        Judgment: Judgment normal.     Results for orders placed or performed in visit on 07/12/23  CBC with Differential/Platelet   Collection Time: 07/12/23  8:27 AM  Result Value Ref Range   WBC 8.6 3.4 - 10.8 x10E3/uL   RBC 3.73 (L) 3.77 - 5.28 x10E6/uL   Hemoglobin 11.5 11.1 - 15.9 g/dL   Hematocrit 64.5 65.9 - 46.6 %   MCV 95 79 - 97 fL   MCH 30.8 26.6 - 33.0 pg    MCHC 32.5 31.5 - 35.7 g/dL   RDW 87.4 88.2 - 84.5 %   Platelets 231 150 - 450 x10E3/uL   Neutrophils 67 Not Estab. %   Lymphs 20 Not Estab. %   Monocytes 7 Not Estab. %   Eos 5 Not Estab. %   Basos 1 Not Estab. %   Neutrophils Absolute 5.8 1.4 - 7.0 x10E3/uL   Lymphocytes Absolute 1.7 0.7 - 3.1 x10E3/uL   Monocytes Absolute 0.6 0.1 - 0.9 x10E3/uL   EOS (ABSOLUTE) 0.4 0.0 - 0.4 x10E3/uL   Basophils Absolute 0.1 0.0 - 0.2 x10E3/uL   Immature Granulocytes 0 Not Estab. %   Immature Grans (Abs) 0.0 0.0 - 0.1 x10E3/uL  Comprehensive metabolic panel with GFR   Collection Time: 07/12/23  8:27 AM  Result Value Ref Range   Glucose 101 (H) 70 - 99 mg/dL   BUN 37 (H) 10 - 36 mg/dL   Creatinine, Ser 8.66 (H) 0.57 - 1.00 mg/dL   eGFR 38 (L) >40 fO/fpw/8.26   BUN/Creatinine Ratio 28 12 - 28   Sodium 138 134 - 144 mmol/L   Potassium 4.4 3.5 - 5.2 mmol/L   Chloride 102 96 - 106 mmol/L   CO2 21 20 - 29 mmol/L   Calcium 9.6 8.7 - 10.3 mg/dL   Total Protein 6.7 6.0 - 8.5 g/dL   Albumin 4.4 3.6 - 4.6 g/dL   Globulin, Total 2.3 1.5 - 4.5 g/dL   Bilirubin Total 0.5 0.0 - 1.2 mg/dL   Alkaline Phosphatase 68 44 - 121 IU/L   AST 14 0 - 40 IU/L   ALT 11 0 - 32 IU/L  Lipid Panel w/o Chol/HDL Ratio   Collection Time: 07/12/23  8:27 AM  Result Value Ref Range   Cholesterol, Total 162 100 - 199 mg/dL   Triglycerides 826 (H) 0 - 149 mg/dL   HDL 46 >60 mg/dL   VLDL Cholesterol Cal 30 5 - 40 mg/dL   LDL Chol Calc (NIH) 86 0 - 99 mg/dL  TSH   Collection Time: 07/12/23  8:27 AM  Result Value Ref Range   TSH 2.010 0.450 - 4.500 uIU/mL  Vitamin B12   Collection Time: 07/12/23  8:27 AM  Result Value Ref Range   Vitamin B-12 1,166 232 - 1,245 pg/mL  Urine Microalbumin w/creat. ratio   Collection Time: 07/12/23  9:06 AM  Result Value Ref Range   Creatinine, Urine 90.9 Not Estab. mg/dL  Microalbumin, Urine 14.7 Not Estab. ug/mL   Microalb/Creat Ratio 16 0 - 29 mg/g creat      Assessment & Plan:    Problem List Items Addressed This Visit       Genitourinary   Benign hypertensive renal disease   Under good control on current regimen. Continue current regimen. Continue to monitor. Call with any concerns. Refills given. Labs drawn today.       Relevant Orders   CBC with Differential/Platelet   Comprehensive metabolic panel with GFR     Other   Hyperlipidemia - Primary   Under good control on current regimen. Continue current regimen. Continue to monitor. Call with any concerns. Refills given. Labs drawn today.        Relevant Medications   hydrALAZINE  (APRESOLINE ) 25 MG tablet   indapamide  (LOZOL ) 2.5 MG tablet   lisinopril  (ZESTRIL ) 40 MG tablet   simvastatin  (ZOCOR ) 20 MG tablet   Other Relevant Orders   CBC with Differential/Platelet   Comprehensive metabolic panel with GFR   Lipid Panel w/o Chol/HDL Ratio   Other Visit Diagnoses       Needs flu shot       Flu shot given today.   Relevant Orders   Flu vaccine HIGH DOSE PF(Fluzone Trivalent)        Follow up plan: Return in about 6 months (around 07/14/2024) for physical.

## 2024-01-14 NOTE — Assessment & Plan Note (Signed)
 Under good control on current regimen. Continue current regimen. Continue to monitor. Call with any concerns. Refills given. Labs drawn today.

## 2024-01-15 ENCOUNTER — Ambulatory Visit: Payer: Self-pay | Admitting: Family Medicine

## 2024-01-15 LAB — LIPID PANEL W/O CHOL/HDL RATIO
Cholesterol, Total: 142 mg/dL (ref 100–199)
HDL: 40 mg/dL (ref >39)
LDL Chol Calc (NIH): 69 mg/dL (ref 0–99)
Triglycerides: 200 mg/dL — ABNORMAL HIGH (ref 0–149)
VLDL Cholesterol Cal: 33 mg/dL (ref 5–40)

## 2024-01-15 LAB — CBC WITH DIFFERENTIAL/PLATELET
Basophils Absolute: 0 x10E3/uL (ref 0.0–0.2)
Basos: 0 %
EOS (ABSOLUTE): 0.4 x10E3/uL (ref 0.0–0.4)
Eos: 4 %
Hematocrit: 36.8 % (ref 34.0–46.6)
Hemoglobin: 11.6 g/dL (ref 11.1–15.9)
Immature Grans (Abs): 0 x10E3/uL (ref 0.0–0.1)
Immature Granulocytes: 0 %
Lymphocytes Absolute: 1.7 x10E3/uL (ref 0.7–3.1)
Lymphs: 18 %
MCH: 30.8 pg (ref 26.6–33.0)
MCHC: 31.5 g/dL (ref 31.5–35.7)
MCV: 98 fL — ABNORMAL HIGH (ref 79–97)
Monocytes Absolute: 0.7 x10E3/uL (ref 0.1–0.9)
Monocytes: 8 %
Neutrophils Absolute: 6.8 x10E3/uL (ref 1.4–7.0)
Neutrophils: 70 %
Platelets: 221 x10E3/uL (ref 150–450)
RBC: 3.77 x10E6/uL (ref 3.77–5.28)
RDW: 12.8 % (ref 11.7–15.4)
WBC: 9.7 x10E3/uL (ref 3.4–10.8)

## 2024-01-15 LAB — COMPREHENSIVE METABOLIC PANEL WITH GFR
ALT: 10 IU/L (ref 0–32)
AST: 14 IU/L (ref 0–40)
Albumin: 4.4 g/dL (ref 3.6–4.6)
Alkaline Phosphatase: 64 IU/L (ref 48–129)
BUN/Creatinine Ratio: 28 (ref 12–28)
BUN: 41 mg/dL — ABNORMAL HIGH (ref 10–36)
Bilirubin Total: 0.3 mg/dL (ref 0.0–1.2)
CO2: 21 mmol/L (ref 20–29)
Calcium: 9.8 mg/dL (ref 8.7–10.3)
Chloride: 103 mmol/L (ref 96–106)
Creatinine, Ser: 1.44 mg/dL — ABNORMAL HIGH (ref 0.57–1.00)
Globulin, Total: 2 g/dL (ref 1.5–4.5)
Glucose: 103 mg/dL — ABNORMAL HIGH (ref 70–99)
Potassium: 4.6 mmol/L (ref 3.5–5.2)
Sodium: 138 mmol/L (ref 134–144)
Total Protein: 6.4 g/dL (ref 6.0–8.5)
eGFR: 34 mL/min/1.73 — ABNORMAL LOW (ref >59)

## 2024-01-15 NOTE — Telephone Encounter (Signed)
 Result note has been printed and mailed to patient

## 2024-02-26 DIAGNOSIS — L905 Scar conditions and fibrosis of skin: Secondary | ICD-10-CM | POA: Diagnosis not present

## 2024-02-26 DIAGNOSIS — C4401 Basal cell carcinoma of skin of lip: Secondary | ICD-10-CM | POA: Diagnosis not present

## 2024-03-04 DIAGNOSIS — C44319 Basal cell carcinoma of skin of other parts of face: Secondary | ICD-10-CM | POA: Diagnosis not present

## 2024-03-11 DIAGNOSIS — C4441 Basal cell carcinoma of skin of scalp and neck: Secondary | ICD-10-CM | POA: Diagnosis not present

## 2024-03-18 DIAGNOSIS — D0472 Carcinoma in situ of skin of left lower limb, including hip: Secondary | ICD-10-CM | POA: Diagnosis not present

## 2024-07-16 ENCOUNTER — Encounter: Admitting: Family Medicine

## 2024-07-29 ENCOUNTER — Ambulatory Visit
